# Patient Record
Sex: Male | Born: 1995 | Race: White | Hispanic: No | Marital: Single | Smoking: Never smoker
Health system: Southern US, Community
[De-identification: ages and names within clinical notes are randomized; demographics above are authoritative.]

## PROBLEM LIST (undated history)

## (undated) DIAGNOSIS — H35109 Retinopathy of prematurity, unspecified, unspecified eye: Secondary | ICD-10-CM

## (undated) DIAGNOSIS — R569 Unspecified convulsions: Secondary | ICD-10-CM

## (undated) HISTORY — DX: Retinopathy of prematurity, unspecified, unspecified eye: H35.109

## (undated) HISTORY — PX: REFRACTIVE SURGERY: SHX103

## (undated) HISTORY — PX: OTHER SURGICAL HISTORY: SHX169

---
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
## 2013-01-21 ENCOUNTER — Other Ambulatory Visit: Payer: Self-pay | Admitting: Family

## 2013-06-12 ENCOUNTER — Encounter: Payer: Self-pay | Admitting: Family

## 2013-06-12 ENCOUNTER — Ambulatory Visit (INDEPENDENT_AMBULATORY_CARE_PROVIDER_SITE_OTHER): Payer: Medicaid Other | Admitting: Family

## 2013-06-12 VITALS — BP 110/70 | HR 78 | Ht 68.25 in | Wt 120.6 lb

## 2013-06-12 DIAGNOSIS — Z79899 Other long term (current) drug therapy: Secondary | ICD-10-CM

## 2013-06-12 DIAGNOSIS — G40309 Generalized idiopathic epilepsy and epileptic syndromes, not intractable, without status epilepticus: Secondary | ICD-10-CM

## 2013-06-12 NOTE — Progress Notes (Signed)
Patient: Franklin [name] MRN: [id number] Sex: male DOB: Mar 01, 1996  Provider: Elveria Rising, NP Location of Care: [facility]  Note type: Routine return visit  History of Present Illness: Referral Source: Dr. Arnetha Courser Mccormick History from: patient and his father Chief Complaint: Seizures  Franklin [name] is a 17 y.o. male with history of prematurity and a seizure disorder. He was last seen March 24, 2012. His last seizure occurred on September 22, 2012, in the setting of sleep deprivation. On that day, he had gone to bed at 1AM and gotten up at 7AM. The seizure occurred shortly after getting up, and lasted 30 seconds. We did not make changes in his medication dose since it was known to be in the setting of sleep deprivation. Franklin [name] tends to stay up late but usually sleeps late each morning. He is not employed, and spends his days playing video games. 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 Franklin [name] has been otherwise healthy since last seen.  Review of Systems: 12 system review was remarkable for memory loss, confusion, headache, dizziness, insomnia and not enough sleep  Past Medical History  Diagnosis Date  . Seizures   . Prematurity   . Retinopathy of prematurity    Hospitalizations: no, Head Injury: no, Nervous System Infections: no, Immunizations up to date: yes Past Medical History Comments: history of prematurity, laser surgery for retinopathy of prematurity and seizure disorder. He has had some lapses in his Medicaid coverage, which has created some difficulties with adherence to treatment plan.  Surgical History Past Surgical History  Procedure Laterality Date  . Central line placement      As premature infant  . Refractive surgery      For retinopathy of prematurity    Family History family history includes Autism in his brother; Diabetes type II in his father; Hypercholesterolemia in his father; Hypertension in his father; Stroke  in his paternal aunt and paternal grandmother. Family History is negative migraines, seizures, cognitive impairment, blindness, deafness, birth defects, chromosomal disorder.  Social History History   Social History  . Marital Status: Single    Spouse Name: N/A    Number of Children: N/A  . Years of Education: N/A   Social History Main Topics  . Smoking status: Never Smoker   . Smokeless tobacco: Never Used  . Alcohol Use: No  . Drug Use: No  . Sexual Activity: None   Other Topics Concern  . None   Social History Narrative   Parents are divorced. Franklin [name] lives with his father.   Educational level 66YQ grade certificate Occupation: unemployed Living with father  Hobbies/Interest: Adult nurse games School comments Franklin [name] graduated from [employer] June 2013.  Current Outpatient Prescriptions on File Prior to Visit  Medication Sig Dispense Refill  . aspirin 325 MG tablet Take 650 mg by mouth as needed. Headache      . lamoTRIgine (LAMICTAL) 100 MG tablet TAKE 2 1/2 TABLETS BY MOUTH EVERY MORNING AND 3 TABLETS EVERY EVENING  495 tablet  3  . traMADol (ULTRAM) 50 MG tablet Take 50 mg by mouth every 6 (six) hours as needed.       No current facility-administered medications on file prior to visit.   The medication list was reviewed and reconciled. All changes or newly prescribed medications were explained.  A complete medication list was provided to the patient/caregiver.  Allergies  Allergen Reactions  . Concerta [Methylphenidate]     Physical  Exam BP 110/70  Pulse 78  Ht 5' 8.25" (1.734 m)  Wt 120 lb 9.6 oz (54.704 kg)  BMI 18.19 kg/m2 General: well developed, well nourished young man, seated on exam table, in no evident distress; right handed Head: head normocephalic and atraumatic.   Ears, Nose and Throat: Oropharynx benign. Neck: supple with no carotid or supraclavicular bruits. Respiratory: lungs clear to auscultation Cardiovascular: regular rate and  rhythm, no murmurs Musculoskeletal: no obvious deformities or lesions Skin: Mild facial acne, small reddish lesion on right parietal area of scalp  Neurologic Exam  Mental Status: Awake and fully alert.  Oriented to place and time.  Recent and remote memory intact.  Attention span, concentration, and fund of knowledge appropriate.  Mood and affect appropriate. Cranial Nerves: Fundoscopic exam reveals sharp disc margins.  Pupils equal, briskly reactive to light.  Extraocular movements full without nystagmus.  Visual fields full to confrontation.  Hearing intact and symmetric to finger rub.  Facial sensation intact.  Face, tongue, palate move normally and symmetrically.  Neck flexion and extension normal. He has mild dysarthria.  Motor: Normal bulk and tone.  Normal strength in all tested extremity muscles. Sensory: Intact to touch and temperature in all extremities. Coordination: Rapid alternating movements normal in all extremities.  Finger-to-nose and heel-to-shin performed accurately bilaterally. Romberg negative. Gait and Station: Arises from chair without difficulty.  Stance is normal.  Gait demonstrates normal stride length and balance.  Able to heel, toe, and tandem walk without difficulty. Reflexes: 1+ and symmetric.  Toes downgoing.  Assessment and Plan Franklin [name] is a 17 year old young man with history of prematurity and seizure disorder. He is taking and tolerating Lamotrigine. His last seizure occurred in January 2014 in the setting of sleep deprivation. I talked with Franklin [name] and his father about the need for him to have better sleep hygiene. He will continue his medication without change and will return for follow up in 1 year or sooner if needed.

## 2013-06-14 ENCOUNTER — Encounter: Payer: Self-pay | Admitting: Family

## 2013-06-14 DIAGNOSIS — G40309 Generalized idiopathic epilepsy and epileptic syndromes, not intractable, without status epilepticus: Secondary | ICD-10-CM | POA: Insufficient documentation

## 2013-06-14 DIAGNOSIS — Z79899 Other long term (current) drug therapy: Secondary | ICD-10-CM | POA: Insufficient documentation

## 2013-06-14 NOTE — Patient Instructions (Signed)
Continue taking Lamotrigine 100mg  1+1/2 tablets in the morning and 2 tablets at night. Let me know if you have any seizures.  Work on improving your sleep habits, and try to get at least 8 hours of sleep each night. Please plan to return for follow up in 1 year or sooner if needed.

## 2013-06-16 ENCOUNTER — Telehealth: Payer: Self-pay | Admitting: Family

## 2013-06-16 DIAGNOSIS — G40309 Generalized idiopathic epilepsy and epileptic syndromes, not intractable, without status epilepticus: Secondary | ICD-10-CM

## 2013-06-16 DIAGNOSIS — Z79899 Other long term (current) drug therapy: Secondary | ICD-10-CM

## 2013-06-16 NOTE — Telephone Encounter (Signed)
Dad, Franklin [name], left a message that Zymarion had a seizure at 7:40AM. TG

## 2013-06-16 NOTE — Telephone Encounter (Signed)
I called Dad back and he said that Franklin [name] went to bed around 2AM, then got up around 6-6:30AM to do some work with a friend. Dad heard him fall and found him in seizure. Dad estimates it lasted 3 minutes. I talked with him about sleep deprivation and seizures, and Dad agreed. I also recommended checking Lamotrigine level because that has not been done in some time because of problems with insurance. Dad agreed with this. I will mail a blood test order to him. TG

## 2013-06-17 NOTE — Telephone Encounter (Signed)
I reviewed your note and agree with the plan. 

## 2013-06-28 LAB — LAMOTRIGINE LEVEL: Lamotrigine Lvl: 12.4 ug/mL (ref 3.0–14.0)

## 2013-06-28 MED ORDER — LAMOTRIGINE 100 MG PO TABS: ORAL_TABLET | ORAL | Status: DC

## 2013-06-28 NOTE — Telephone Encounter (Signed)
I talked to father and informed him to increase the dose of medication to 3 twice a day, he understood and agreed.

## 2013-06-28 NOTE — Addendum Note (Signed)
Addended by: Deetta Perla on: 06/28/2013 05:37 PM   Modules accepted: Orders

## 2013-11-21 ENCOUNTER — Telehealth: Payer: Self-pay

## 2013-11-21 NOTE — Telephone Encounter (Signed)
Lorella NimrodHarvey, dad, lvm stating that pt had a sz this morning. Lasted 1 min, dad said it was the shortest sz he has seen thus far. I called dad and he said that pt was up late with his cousin playing video games last night. Got up this morning and started playing again. Dad did not witness sz, cousin told dad that it lasted about a min. After sz, pt was sleepy, currently sleeping. Dad said that pt has not been ill or missed any meds. Taking lamotrigine 100 mg 3 po BID. Dad believes sz was due to sleep deprivation and too much screen time. If there are any questions dad can be reached at [phone]. He did not request call back, was just calling as an [place].

## 2013-11-21 NOTE — Telephone Encounter (Signed)
I called and talked to Dad. He said that [name] stayed up very late, was sleepy when he got up and immediately started playing video games. I told Dad that I agreed that sleep deprivation was trigger for seizure. I told him that I would not recommend changes in his medication at this time and Dad agreed. TG

## 2013-11-21 NOTE — Telephone Encounter (Signed)
I reviewed both messages and agree with this plan.

## 2014-03-22 ENCOUNTER — Encounter (HOSPITAL_COMMUNITY): Payer: Self-pay | Admitting: Emergency Medicine

## 2014-03-22 ENCOUNTER — Emergency Department (HOSPITAL_COMMUNITY)
Admission: EM | Admit: 2014-03-22 | Discharge: 2014-03-22 | Disposition: A | Discharge status: Home / Self Care | Payer: Medicaid Other | Attending: Emergency Medicine | Admitting: Emergency Medicine

## 2014-03-22 ENCOUNTER — Emergency Department (HOSPITAL_COMMUNITY): Payer: Medicaid Other

## 2014-03-22 DIAGNOSIS — X500XXA Overexertion from strenuous movement or load, initial encounter: Secondary | ICD-10-CM | POA: Diagnosis not present

## 2014-03-22 DIAGNOSIS — Y9289 Other specified places as the place of occurrence of the external cause: Secondary | ICD-10-CM | POA: Diagnosis not present

## 2014-03-22 DIAGNOSIS — S93409A Sprain of unspecified ligament of unspecified ankle, initial encounter: Secondary | ICD-10-CM | POA: Diagnosis not present

## 2014-03-22 DIAGNOSIS — S93401A Sprain of unspecified ligament of right ankle, initial encounter: Secondary | ICD-10-CM

## 2014-03-22 DIAGNOSIS — Y9301 Activity, walking, marching and hiking: Secondary | ICD-10-CM | POA: Diagnosis not present

## 2014-03-22 DIAGNOSIS — G40909 Epilepsy, unspecified, not intractable, without status epilepticus: Secondary | ICD-10-CM | POA: Insufficient documentation

## 2014-03-22 DIAGNOSIS — S8990XA Unspecified injury of unspecified lower leg, initial encounter: Secondary | ICD-10-CM | POA: Diagnosis present

## 2014-03-22 DIAGNOSIS — S9000XA Contusion of unspecified ankle, initial encounter: Secondary | ICD-10-CM | POA: Insufficient documentation

## 2014-03-22 DIAGNOSIS — S99919A Unspecified injury of unspecified ankle, initial encounter: Secondary | ICD-10-CM | POA: Diagnosis present

## 2014-03-22 MED ORDER — NAPROXEN 500 MG PO TABS: 500.0000 mg | ORAL_TABLET | Freq: Two times a day (BID) | ORAL | Status: DC

## 2014-03-22 NOTE — Discharge Instructions (Signed)
Ankle Sprain °An ankle sprain is an injury to the strong, fibrous tissues (ligaments) that hold the bones of your ankle joint together.  °CAUSES °An ankle sprain is usually caused by a fall or by twisting your ankle. Ankle sprains most commonly occur when you step on the outer edge of your foot, and your ankle turns inward. People who participate in sports are more prone to these types of injuries.  °SYMPTOMS  °· Pain in your ankle. The pain may be present at rest or only when you are trying to stand or walk. °· Swelling. °· Bruising. Bruising may develop immediately or within 1 to 2 days after your injury. °· Difficulty standing or walking, particularly when turning corners or changing directions. °DIAGNOSIS  °Your caregiver will ask you details about your injury and perform a physical exam of your ankle to determine if you have an ankle sprain. During the physical exam, your caregiver will press on and apply pressure to specific areas of your foot and ankle. Your caregiver will try to move your ankle in certain ways. An X-ray exam may be done to be sure a bone was not broken or a ligament did not separate from one of the bones in your ankle (avulsion fracture).  °TREATMENT  °Certain types of braces can help stabilize your ankle. Your caregiver can make a recommendation for this. Your caregiver may recommend the use of medicine for pain. If your sprain is severe, your caregiver may refer you to a surgeon who helps to restore function to parts of your skeletal system (orthopedist) or a physical therapist. °HOME CARE INSTRUCTIONS  °· Apply ice to your injury for 1-2 days or as directed by your caregiver. Applying ice helps to reduce inflammation and pain. °¨ Put ice in a plastic bag. °¨ Place a towel between your skin and the bag. °¨ Leave the ice on for 15-20 minutes at a time, every 2 hours while you are awake. °· Only take over-the-counter or prescription medicines for pain, discomfort, or fever as directed by  your caregiver. °· Elevate your injured ankle above the level of your heart as much as possible for 2-3 days. °· If your caregiver recommends crutches, use them as instructed. Gradually put weight on the affected ankle. Continue to use crutches or a cane until you can walk without feeling pain in your ankle. °· If you have a plaster splint, wear the splint as directed by your caregiver. Do not rest it on anything harder than a pillow for the first 24 hours. Do not put weight on it. Do not get it wet. You may take it off to take a shower or bath. °· You may have been given an elastic bandage to wear around your ankle to provide support. If the elastic bandage is too tight (you have numbness or tingling in your foot or your foot becomes cold and blue), adjust the bandage to make it comfortable. °· If you have an air splint, you may blow more air into it or let air out to make it more comfortable. You may take your splint off at night and before taking a shower or bath. Wiggle your toes in the splint several times per day to decrease swelling. °SEEK MEDICAL CARE IF:  °· You have rapidly increasing bruising or swelling. °· Your toes feel extremely cold or you lose feeling in your foot. °· Your pain is not relieved with medicine. °SEEK IMMEDIATE MEDICAL CARE IF: °· Your toes are numb or blue. °·   You have severe pain that is increasing. °MAKE SURE YOU:  °· Understand these instructions. °· Will watch your condition. °· Will get help right away if you are not doing well or get worse. °Document Released: 08/24/2005 Document Revised: 05/18/2012 Document Reviewed: 09/05/2011 °ExitCare® Patient Information ©2015 [employer]. This information is not intended to replace advice given to you by your health care provider. Make sure you discuss any questions you have with your health care provider. ° ° °Emergency Department Resource Guide °1) Find a Doctor and Pay Out of Pocket °Although you won't have to find out who is covered  by your insurance plan, it is a good idea to ask around and get recommendations. You will then need to call the office and see if the doctor you have chosen will accept you as a new patient and what types of options they offer for patients who are self-pay. Some doctors offer discounts or will set up payment plans for their patients who do not have insurance, but you will need to ask so you aren't surprised when you get to your appointment. ° °2) Contact Your Local Health Department °Not all health departments have doctors that can see patients for sick visits, but many do, so it is worth a call to see if yours does. If you don't know where your local health department is, you can check in your phone book. The CDC also has a tool to help you locate your state's health department, and many state websites also have listings of all of their local health departments. ° °3) Find a Walk-in Clinic °If your illness is not likely to be very severe or complicated, you may want to try a walk in clinic. These are popping up all over the country in pharmacies, drugstores, and shopping centers. They're usually staffed by nurse practitioners or physician assistants that have been trained to treat common illnesses and complaints. They're usually fairly quick and inexpensive. However, if you have serious medical issues or chronic medical problems, these are probably not your best option. ° °No Primary Care Doctor: °- Call [facility] at  832-8000 - they can help you locate a primary care doctor that  accepts your insurance, provides certain services, etc. °- Physician Referral Service- 1-800-533-3463 ° °Chronic Pain Problems: °Organization         Address  Phone   Notes  °[facility]  (336) 297-2271 Patients need to be referred by their primary care doctor.  ° °Medication Assistance: °Organization         Address  Phone   Notes  °[place] [facility] [address] °[place], [place] [address] (336) 641-8030 --Must be a resident of [place] °-- Must have NO insurance coverage whatsoever (no Medicaid/ Medicare, etc.) °-- The pt. MUST have a primary care doctor that directs their care regularly and follows them in the community °  °[facility]  (866) 331-1348   °[employer]  (888) 892-1162   ° °Agencies that provide inexpensive medical care: °Organization         Address  Phone   Notes  °[facility]  (336) 832-8035   °[facility]    (336) 832-7272   °[facility] [address] °[place], [place] [address] (336) 832-4777   °[facility] [address], ° (336) 271-4999   °[facility]    (336) 373-0678   °[facility]    (  336) 272-1050   °[facility] ° [address], [place] Phone:  (336) 832-4444, Fax:  (336) 832-4440 Hours of Operation:  9 am - 6 pm, M-F.  Also accepts Medicaid/Medicare and self-pay.  °[facility] ° [address], [place] Phone: (336) 832-3150, Fax: (336) 832-3151. Hours of Operation:  8:30 am - 5:30 pm, M-F.  Also accepts Medicaid and self-pay.  °[facility] [place] [address], [place] Phone: (336) 878-6027   °[facility] [address], [place], [place] (336)723-1848, Ext. 123 Mondays & Thursdays: 7-9 AM.  First 15 patients are seen on a first come, first serve basis. °  ° °Medicaid-accepting [place] Providers: ° °Organization         Address  Phone   Notes  °[facility] [address], [place] (336) 641-2100 Also accepts self-pay patients.  °[facility] [address], [place] ° (336) 856-9996   °[facility] [address], [place] (336) 288-8857   °[facility] [address] [place] [address], [place] (336) 299-7000   °Veita Bland [address], [place]  ° (336) 373-1557 Only accepts [place] Access Medicaid patients after they have their name applied to their card.  ° °Self-Pay (no insurance) in [place]: ° °Organization         Address  Phone   Notes  °Sickle Cell Patients, [facility] [address], [place] (336) 832-1970   °[facility] [address], [place] (336) 832-4400   °[facility] ° [address], [place] (336) 992-4800   °[facility]/Dr. Osei-Bonsu ° [address] [place] [address], [place] or [address], [place] (336) 841-8500 Phone number for both [place] and [place] locations is the same.  °[facility] [address], [place] (336) 299-0000   °[facility] [address] [place] [address], [place] or [address] (336) 852-7530 °(336) 878-2260   °[facility] [address], [place] (336) 350-1642, phone; (336) 294-5005, fax Sees patients 1st and 3rd Saturday of every month.  Must not qualify for public or private insurance (i.e. Medicaid, Medicare, [place] Health Choice, Veterans' Benefits) • Household income should be no more than 200% of the poverty level •The clinic cannot treat you if you are pregnant or think you are pregnant • Sexually transmitted diseases are not treated at the clinic.  ° ° °Dental Care: °Organization         Address  Phone  Notes  °[facility] [address], [place] (336) 641-6152 Accepts children up to age 21 who are enrolled in Medicaid or [place] Health Choice; pregnant women with a Medicaid card; and children who have applied for Medicaid or [place] Health Choice, but were declined, whose parents can pay a reduced fee at time of service.  °[facility] [place]  [address], [place] (336) 641-7733 Accepts children up to age 21 who are enrolled  in Medicaid or [place] Health Choice; pregnant women with a Medicaid card; and children who have applied for Medicaid or [place] Health Choice, but were declined, whose parents can pay a reduced fee at time of service.  °[facility] ° [address], [place] (336) 641-4533 Patients are seen by appointment only. Walk-ins are not accepted. [facility] will see patients 18 years of age and older. °Monday - Tuesday (8am-5pm) °Most Wednesdays (8:30-5pm) °$30 per visit, cash only  °[facility] ° [address], [place] (336) 641-4533 Patients are seen by appointment only. Walk-ins are not accepted. [facility] will see patients 18 years of age and older. °One Wednesday Evening (Monthly: Volunteer Based).  $30 per visit, cash only  °[facility]  (919) 537-3737 for adults; Children under age 4, call Graduate Pediatric Dentistry at (919) [phone]. Children aged 4-14, please call (919) 537-3737 to request a pediatric application. ° Dental services are provided in all areas of dental care including fillings, crowns and bridges, complete and partial dentures, implants, gum treatment, root canals, and extractions. Preventive care is also provided. Treatment is provided to both adults and children. °Patients are selected via a lottery and there is often a waiting list. °  °[facility] [address], °[place] ° (336) 763-8833 [email or web address] °  °[facility] [address], [place], [place] (336)723-1848, Ext. 123 Second and Fourth Thursday of each month, opens at 6:30 AM; Clinic ends at 9 AM.  Patients are seen on a first-come first-served basis, and a limited number are seen during each clinic.  ° °[facility] ° [address], [place], [place] (336) 723-7904   Eligibility Requirements °You must have lived in [place], [place], or [place] counties for at least the last three months. °  You cannot be  eligible for state or federal sponsored healthcare insurance, including Veterans Administration, Medicaid, or Medicare. °  You generally cannot be eligible for healthcare insurance through your employer.  °  How to apply: °Eligibility screenings are held every Tuesday and Wednesday afternoon from 1:00 pm until 4:00 pm. You do not need an appointment for the interview!  °[facility] [address], [place], [place] 336-631-2330   °[facility]  336-342-8273   °[facility]  336-703-3100   °[facility]  336-570-6415   ° °Behavioral Health Resources in the Community: °Intensive Outpatient Programs °Organization         Address  Phone  Notes  °[facility] [address], [place], [place] 336-878-6098   °[facility] [address], [place], [place] 336-832-9800   °[facility] [address], [place], [place] ° 336-882-2125   °[facility] [address],  °[place], [place] 1-800-853-5163 or 336-641-4981   °Substance Abuse Resources °Organization         Address  Phone  Notes  °[facility]  336-882-2125   °[facility]  336-784-9470   °[facility]  336-285-9073   °[facility]  336-845-3988   °Residential & Outpatient Substance Abuse Program  1-800-659-3381   °Psychological Services °Organization         Address  Phone  Notes  °[facility]  336- 832-9600   °[facility]  336- 378-7881   °[facility] [address], [place] 1-800-853-5163 or 336-641-4981   ° °Mobile Crisis Teams °Organization         Address  Phone  Notes  °[facility], Mobile Crisis Care Unit  1-877-626-1772   °Assertive °[facility] ° [address] ,    336-834-9664   °Sharon DeEsch [address] °[place] 336-554-5454   ° °Self-Help/Support Groups °Organization          Address  Phone             Notes  °[facility] - variety of support groups  336- 373-1402 Call for more information  °Narcotics Anonymous (NA), [facility] [address], °[place]  2 meetings at this location  ° °Residential Treatment Programs °Organization         Address  Phone  Notes  °[facility] [address],    °[place]  1-866-801-8205   °[facility] ° [address], [place], [place] 704-293-8524   °[facility] [address], [place] 336-845-3988 Admissions: 8am-3pm M-F  °[facility] [address]    °[place], [place] 336-841-1104   °[facility] [address], [place], [place] 336-379-7146   °[facility] [address]  °[place], [place] 336-285-9073   °[facility] Intensive Outpatient [address], [place], [place] 336-852-3033   °[facility] [address]  °[place], [place] 1-877-615-2722 or 336-784-9470   °[facility] [address] [place], [place] 336-227-7417 Accepts Medicaid  °[facility] [address]  °[place] 1-800-659-3381 Substance Abuse/Addiction Treatment  ° °[place] Behavioral Health Resources °Organization         Address  Phone  Notes  °[facility]  (888) 581-9988   °Julie Brannon, PhD [address] [place], [place]   (336) 349-5553 or (336) 951-0000   °[facility]   [address] °[place], [place] (336) 349-4454   °[facility] [address], [place], [place] (336) 342-8316 Insurance/Medicaid/sponsorship through [facility]  °[facility] [address]                                    [place], [place] (336) 342-8316 Therapy/tele-psych/case  °[facility] [address]  ° [place], [place] (336) 349-2233    °Dr. Arfeen  (336) 349-4544   °[facility] [place]  [employer]  [facility] 1) [address], [place] °2) [address], [place] °3)  [address], [place] (336) 349-3220 °(336) 342-7768 ° °(336) 342-8140   °[place] [facility] (336) 342-1394 or (336) 342-3537 (After Hours)    ° ° ° °
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
## 2014-03-23 NOTE — ED Provider Notes (Signed)
Medical screening examination/treatment/procedure(s) were performed by non-physician practitioner and as supervising physician I was immediately available for consultation/collaboration.   EKG Interpretation None       Stevin Bielinski, MD 03/23/14 1451 

## 2014-04-09 ENCOUNTER — Telehealth: Payer: Self-pay | Admitting: Family

## 2014-04-09 NOTE — Telephone Encounter (Signed)
[facility] [place] [name] had a seizure on Saturday 04/07/14. I called Dad to talk with him about it. He said that [place] [name] had stayed up late the night before, then [place] [employer] [place] the [place] playing video games,then walked 1.5 miles home. He had a seizure that evening that lasted 2-3 minutes. He has not missed medication. Dad says that he has a seizure every 6 months or so when he is sleep deprived. He last seizure occurred in March. I asked Dad to remind [place] [name] that he should be getting more sleep. I scheduled follow up appointment for him in October, as previously planned but asked Dad to call me if [place] [name] has more seizures in the interim. TG

## 2014-04-09 NOTE — Telephone Encounter (Signed)
I reviewed your note and agree with this plan. 

## 2014-04-25 ENCOUNTER — Telehealth: Payer: Self-pay | Admitting: Family

## 2014-04-25 DIAGNOSIS — G40309 Generalized idiopathic epilepsy and epileptic syndromes, not intractable, without status epilepticus: Secondary | ICD-10-CM

## 2014-04-25 DIAGNOSIS — Z79899 Other long term (current) drug therapy: Secondary | ICD-10-CM

## 2014-04-25 NOTE — Telephone Encounter (Signed)
[name] called to report that [place] [name] had a seizure today that lasted about 1-1/2 minutes. He had not been up late last night as he had been when Dad called to report a seizure on 04/09/14. He is compliant with medication - Dad puts his medication into weekly pill container and checks it. His is taking Lamotrigine 100mg  3 po BID. His last Lamotrigine level was 12.4 mcg/ml on 06/27/13. Dad wonders if sitting to close to the TV could trigger a seizure. [place] [name] has a revisit appt on 06/07/14. I told Dad that I would consult with Dr Sharene SkeansHickling and call back. TG

## 2014-04-26 NOTE — Telephone Encounter (Signed)
I spoke father.  We're going to obtain a lamotrigine level and a CBC with differential.  We may need to increase his dose.  I don't believe that his proximity to the TV or in the longevity with which she watches TV or plays video games had anything to do with his seizures.  His last seizure before this was about 6 weeks ago.  [facility] On Monday, August 24.  Please make certain that the orders are sent there in a timely fashion.Thank you

## 2014-04-30 ENCOUNTER — Other Ambulatory Visit: Payer: Self-pay | Admitting: Pediatrics

## 2014-04-30 LAB — CBC WITH DIFFERENTIAL/PLATELET
BASOS ABS: 0 10*3/uL (ref 0.0–0.1)
BASOS PCT: 0 % (ref 0–1)
Eosinophils Absolute: 0.1 10*3/uL (ref 0.0–0.7)
Eosinophils Relative: 2 % (ref 0–5)
HCT: 43.9 % (ref 39.0–52.0)
Hemoglobin: 15.1 g/dL (ref 13.0–17.0)
Lymphocytes Relative: 34 % (ref 12–46)
Lymphs Abs: 1.8 10*3/uL (ref 0.7–4.0)
MCH: 31 pg (ref 26.0–34.0)
MCHC: 34.4 g/dL (ref 30.0–36.0)
MCV: 90.1 fL (ref 78.0–100.0)
Monocytes Absolute: 0.4 10*3/uL (ref 0.1–1.0)
Monocytes Relative: 7 % (ref 3–12)
NEUTROS ABS: 3 10*3/uL (ref 1.7–7.7)
Neutrophils Relative %: 57 % (ref 43–77)
Platelets: 259 10*3/uL (ref 150–400)
RBC: 4.87 MIL/uL (ref 4.22–5.81)
RDW: 13.5 % (ref 11.5–15.5)
WBC: 5.3 10*3/uL (ref 4.0–10.5)

## 2014-05-02 LAB — LAMOTRIGINE LEVEL: LAMOTRIGINE LVL: 10.2 ug/mL (ref 4.0–18.0)

## 2014-05-02 NOTE — Telephone Encounter (Signed)
Lamotrigine level was 10.2 mcg/mL.  I would recommend increasing him to 3 tablets in the morning and 4 at nighttime unless we want to consider lamotrigine XR.  We can discuss this today.

## 2014-05-02 NOTE — Telephone Encounter (Signed)
Discussed with Dr Sharene Skeans. We will try Selestino on Lamotrigine ER , 3 tablets per day. I called Dad and left a message asking him to call me tomorrow. TG

## 2014-05-03 ENCOUNTER — Encounter: Payer: Self-pay | Admitting: Family

## 2014-05-03 NOTE — Telephone Encounter (Signed)
I called and left a message. I will mail a letter asking him to call. TG

## 2014-05-07 MED ORDER — LAMOTRIGINE ER 250 MG PO TB24: ORAL_TABLET | ORAL | Status: DC

## 2014-05-07 NOTE — Addendum Note (Signed)
Addended by: Deetta Perla on: 05/07/2014 06:50 PM   Modules accepted: Orders

## 2014-05-07 NOTE — Telephone Encounter (Signed)
I spoke with father for 5 minutes.  I recommended changing to lamotrigine 250 mg ER.  We need to use up his 100 mg tablets.  He will increase to 3-1/2 tablets twice daily.  At the end at that time he will switch to 200 mg ER 1 in the morning and 2 at nighttime.  He has not been seen since October 2014.  Asked father to call for an appointment either with Inetta Fermo or me.

## 2014-05-07 NOTE — Telephone Encounter (Signed)
The father would like to know the pt's lab results. He can be reached at [phone].

## 2014-05-08 NOTE — Telephone Encounter (Signed)
Patient already has an appointment with Sula Soda. On Oct. 1. MB

## 2014-05-28 ENCOUNTER — Telehealth: Payer: Self-pay | Admitting: Family

## 2014-05-28 DIAGNOSIS — G40309 Generalized idiopathic epilepsy and epileptic syndromes, not intractable, without status epilepticus: Secondary | ICD-10-CM

## 2014-05-28 NOTE — Telephone Encounter (Signed)
Franklin [name] called to follow up on 10 min seizure that [facility] had yesterday. He said that he was outside in the yard playing with neighbor's child when he suddenly began going in circles and Dad could tell that his behavior was different. He went to him and realized that Franklin [name] was in seizure. He helped him to ground and then Franklin [name] had convulsive seizure for about 5 minutes. Dad said that the walking and nonresponsive portion before the convulsions lasted about 5 minutes as well. He said that Franklin [name] had changed to Lamotrogine XR  1 AM and 2PM about 2 weeks ago (as instructed by Dr Sharene Skeans on Aug 31st phone). He has tolerated it well. The trough drug level prior to that on immediate release Lamotrigine  BID was 10.2 mcg/ml on Aug 24th, done because Franklin [name] had a seizure on Aug 19th. I told Dad that I would consult with Dr Sharene Skeans to see if he wanted to make any changes before his appointment with me on Oct 1st. I would also be happy to see him sooner to discuss medication changes, but will talk with Dr Sharene Skeans before calling Dad back.

## 2014-05-28 NOTE — Telephone Encounter (Signed)
I reached grandmother.  Father was not present.  I recommended that we order a trough lamotrigine level.  I have ordered it and would like you to release it.

## 2014-05-29 NOTE — Telephone Encounter (Signed)
I called this morning and spoke with grandmother. She said that Dad was out and to call later this evening. I called just now and spoke with Dad. He asked for lab order to be mailed to him because Rodrigus just started new dose of Lamotrigine today. He will take him for lab draw next week. I also rescheduled his appointment for October 1st because Dr Sharene Skeans is out of the office that day, and I will need to consult with him about Masud's seizures. Dad accepted an appointment on October 12th @ 145pm, arrival time 130pm. TG

## 2014-05-29 NOTE — Telephone Encounter (Signed)
The father of the pt called. He can be reached at [phone].

## 2014-06-07 ENCOUNTER — Ambulatory Visit: Payer: Medicaid Other | Admitting: Family

## 2014-06-14 LAB — LAMOTRIGINE LEVEL: Lamotrigine Lvl: 6 ug/mL (ref 4.0–18.0)

## 2014-06-18 ENCOUNTER — Encounter: Payer: Self-pay | Admitting: Family

## 2014-06-18 ENCOUNTER — Ambulatory Visit (INDEPENDENT_AMBULATORY_CARE_PROVIDER_SITE_OTHER): Payer: Medicaid Other | Admitting: Family

## 2014-06-18 VITALS — BP 106/74 | HR 80 | Ht 68.75 in | Wt 126.8 lb

## 2014-06-18 DIAGNOSIS — Z79899 Other long term (current) drug therapy: Secondary | ICD-10-CM

## 2014-06-18 DIAGNOSIS — G40309 Generalized idiopathic epilepsy and epileptic syndromes, not intractable, without status epilepticus: Secondary | ICD-10-CM

## 2014-06-18 NOTE — Progress Notes (Signed)
Patient: Franklin [name] MRN: [id number] Sex: male DOB: 07/29/1996  Provider: Elveria RisingGOODPASTURE, Din Bookwalter, NP [facility]  Note type: Routine return visit  History of Present Illness: Referral Source: Dr. Dereck LigasHilary B. McCormick History from: patient and his father Chief Complaint: Seizures  Franklin [name] is a 18 y.o. young man with history of prematurity and a seizure disorder. He was last seen June 14, 2013. Franklin [name] has been taking Lamotrigine since 2008. In the past, he has had seizures when he has been sleep deprived. Kazuma typically needs at least 9 hours of sleep, and on days when he gets 6 or fewer hours of sleep, he tends to have breakthrough seizures. He had a seizure shortly after being seen last October  And a seizure in March, both in the setting of sleep deprivation. However in August he had 2 seizures and September he had 1 one seizure. He was not sleep deprived on any of these occasions and he had been compliant with medication. Timofey's father monitors his medication and there are no problems with compliance. A Lamotrigine level was obtained in August and found to be 10.2 mcg/ml. Dr Sharene SkeansHickling spoke with Faron's father and the decision was made to change the Lamotrigine to Lamotrigine XR and increase the dose from 300mg  twice per day to 250mg  in the morning and 500mg  at night. A level was obtained a week after this change and the level was 6 mcg/ml. However he has been seizure free despite the decrease in blood level since May 27, 2014.   Franklin [name] is not employed, and spends his days playing video games. [place] [employer] [place] 1 1.5 miles away. Franklin [name] has some difficulty with sleep because his bedroom is a direct pathway in and out of the home, and his father says that his cousin, who lives in the home, goes in and out during the night. Franklin [name] has been otherwise healthy since last seen.  Review of Systems: 12  system review was remarkable for seizures  Past Medical History  Diagnosis Date  . Seizures   . Prematurity   . Retinopathy of prematurity    Hospitalizations: No., Head Injury: No., Nervous System Infections: No., Immunizations up to date: Yes.   Past Medical History Comments: see Hx.  Surgical History Past Surgical History  Procedure Laterality Date  . Central line placement      As premature infant  . Refractive surgery      For retinopathy of prematurity    Family History family history includes Autism in his brother; Diabetes type II in his father; Hypercholesterolemia in his father; Hypertension in his father; Stroke in his paternal aunt and paternal grandmother. Family History is otherwise negative for migraines, seizures, cognitive impairment, blindness, deafness, birth defects, chromosomal disorder, autism.  Social History History   Social History  . Marital Status: Single    Spouse Name: N/A    Number of Children: N/A  . Years of Education: N/A   Social History Main Topics  . Smoking status: Never Smoker   . Smokeless tobacco: Never Used  . Alcohol Use: No  . Drug Use: No  . Sexual Activity: No   Other Topics Concern  . None   Social History Narrative   Parents are divorced. Franklin [name] lives with his father.   Educational level: 12th grade School Attending:N/A Living with:  father  Hobbies/Interest: video games, movies, drawing, reading, computers and walking. School comments:  Franklin [name]  graduated from [employer] in 2013. He is currently living at home.  Physical Exam BP 106/74  Pulse 80  Ht 5' 8.75" (1.746 m)  Wt 126 lb 12.8 oz (57.516 kg)  BMI 18.87 kg/m2 General: well developed, well nourished young man, seated on exam table, in no evident distress; right handed  Head: head normocephalic and atraumatic.  Ears, Nose and Throat: Oropharynx benign.  Neck: supple with no carotid or supraclavicular bruits.  Respiratory: lungs clear to  auscultation  Cardiovascular: regular rate and rhythm, no murmurs  Musculoskeletal: no obvious deformities or lesions  Skin: Mild facial acne, small reddish lesion on right parietal area of scalp   Neurologic Exam  Mental Status: Awake and fully alert. Oriented to place and time. Recent and remote memory intact. Attention span and concentration appropriate. Fund of knowledge less than expected for age. Mood and affect appropriate.  Cranial Nerves: Fundoscopic exam reveals sharp disc margins. Pupils equal, briskly reactive to light. Extraocular movements full without nystagmus. Visual fields full to confrontation. Hearing intact and symmetric to finger rub. Facial sensation intact. Face, tongue, palate move normally and symmetrically. Neck flexion and extension normal. He has mild dysarthria.  Motor: Normal bulk and tone. Normal strength in all tested extremity muscles.  Sensory: Intact to touch and temperature in all extremities.  Coordination: Rapid alternating movements normal in all extremities. Finger-to-nose and heel-to-shin performed accurately bilaterally. Romberg negative.  Gait and Station: Arises from chair without difficulty. Stance is normal. Gait demonstrates normal stride length and balance. Able to heel, toe, and tandem walk without difficulty.  Reflexes: 1+ and symmetric. Toes downgoing.   Assessment and Plan Franklin [name] is a 18 year old young man with history of prematurity and seizure disorder. He has been taking and tolerating Lamotrigine XR 250mg  1 in the morning and 2 at night since September, as he was having seizures on immediate release Lamotrigine. Franklin [name] has had seizures in the past when he has been sleep deprived. I talked with Franklin [name] and his father about the need for him to have better sleep hygiene, and we talked about ways to accomplish that. Dr Sharene SkeansHickling was consulted and came in to talk with them as well. He repeated the need for Myan to get sufficient sleep, and told his  father that if Franklin [name] continued to have seizures, that we may consider one more increase in Lamotrigine XR to 2 tablets twice per day, or we may consider adding on a second medication. We talked about using Melatonin for sleep, but Keelan's sleep problems are not initiating sleep as much as sleep being disrupted by external sources, and we would prefer to wait before adding Melatonin. For now, he will continue his medication without change and will return for follow up in 6 months or sooner if needed. Dad will continue to notify this office when Franklin [name] has seizures and we will make changes in his treatment plan accordingly.

## 2014-06-19 ENCOUNTER — Encounter: Payer: Self-pay | Admitting: Family

## 2014-06-19 NOTE — Patient Instructions (Signed)
Continue to notify this office when [name] has seizures. We may increase his dose of Lamotrigine XR or we may add a second medication to his treatment.  For now, [name] should continue to take Lamotrigine XR 250 mg 1 in the morning and 2 at night.   Work on getting more sleep as we discussed today. Problems going to sleep or staying asleep are usually due to sleep habits. Perhaps you could talk with your family again about not disturbing Kullen by going through his room to go outside. Here are some tips to help to establish good sleep habits. 1. You should go to bed at the same time every day.  This routine helps to train the sleep center of your brain that it is time to go to sleep on time. 2. You should get up at the same time every day. Late weekend nights or sleeping late on weekends can throw off the sleep schedule for days. 3. You should get regular exercise, preferably in the morning or afternoon, but not right before bedtime. 4. You should get regular exposure to outdoor or bright light, especially in the late afternoon. 5. Keep the temperature in the bedroom cool and comfortable. Sometimes a fan helps with this, and also helps create "white noise" that will also help to tune out other noises that may wake you up at night. 6. Keep the bedroom quiet when sleeping. 7. Keep the bedroom dark enough to facilitate sleep. 8. If there is a clock in the bedroom, turn it to the wall. Staring at the clock does not help with going to sleep and for some people, it makes you stay awake if you awaken during the night. 9. Avoid stimulating activities before sleep such as watching television, playing video games, communicating with friends or exercising. A good rule of thumb is to stop electronics at least 30 minutes before bedtime. It is best not to have televisions, computers, hand held games, phones and so forth in the bedroom. The bedroom should be associated only with sleep. 10. Avoid caffeine such as soft  drinks, chocolate, and tea in the afternoons and evenings. Caffeine can also lead to "light sleep" and frequent awakenings during the night, even if it does not keep the person from falling asleep. 11. If you are awake and tossing and turning, get up and do a quiet, low stimulation activity such as reading a book (not an electronic book, video game or internet surfing) for about 20 minutes. Then turn lights off and try to sleep. If still awake, repeat the process until you are sleepy. If possible, the rest of the home should be quiet during this time. This keeps the bed from being associated with sleeplessness. 12. If you are never drowsy at the desired bedtime, begin a schedule of delayed bedtimes by 30 minutes, so that you experience falling asleep more quickly when you get into bed. Then advance the time earlier every few days until the desired bedtime is reached. This re-trains the sleep center. It may take some time but eventually you will get to sleep earlier.   Please plan to return for follow up in 6 months or sooner if needed.

## 2014-12-19 ENCOUNTER — Other Ambulatory Visit: Payer: Self-pay | Admitting: Pediatrics

## 2014-12-24 ENCOUNTER — Ambulatory Visit (INDEPENDENT_AMBULATORY_CARE_PROVIDER_SITE_OTHER): Payer: Medicaid Other | Admitting: Family

## 2014-12-24 ENCOUNTER — Encounter: Payer: Self-pay | Admitting: Family

## 2014-12-24 VITALS — BP 104/72 | HR 84 | Ht 69.0 in | Wt 126.4 lb

## 2014-12-24 DIAGNOSIS — G40309 Generalized idiopathic epilepsy and epileptic syndromes, not intractable, without status epilepticus: Secondary | ICD-10-CM

## 2014-12-24 MED ORDER — LAMOTRIGINE ER 250 MG PO TB24: ORAL_TABLET | ORAL | Status: DC

## 2014-12-24 NOTE — Progress Notes (Signed)
Patient: Franklin [name] MRN: [id number] Sex: male DOB: 04/21/1996  Provider: Elveria RisingGOODPASTURE, Franklin Bevan, NP [facility]  Note type: Routine return visit  History of Present Illness: Referral Source: Dr. Dereck LigasHilary B. McCormick  [facility] Chief Complaint: Seizures   Franklin [name] [name] is a 19 y.o. young man with history of generalized convulsive seizure disorder. He was last seen June 18, 2014. Franklin [name] has history of prematurity, intellectual disability and a generalized convulsive seizure disorder. Franklin HaileyDustin has been taking Lamotrigine since 2008.  He was switched to Lamotrigine XR in 2015. He tends to have seizures when he has been sleep deprived. Franklin [name] typically needs at least 9 hours of sleep, and on days when he gets 6 or fewer hours of sleep, he tends to have breakthrough seizures. Dad reports today that Franklin HaileyDustin has had no seizures since September 2015.   Franklin HaileyDustin is not employed, and spends his days playing video games and walking from his [place] to [employer] [place], which [place] approximately 1 [place].5 miles away. [place] the [employer] [name] says that he looks up things on the computer, reads and writes down his thoughts. There are no concerns with behavior. Franklin HaileyDustin has been generally healthy since last seen and Dad has no specific concerns about his health other than his seizure history.    Review of Systems: Please see the HPI for neurologic and other pertinent review of systems. Otherwise, the following systems are noncontributory including constitutional, eyes, ears, nose and throat, cardiovascular, respiratory, gastrointestinal, genitourinary, musculoskeletal, skin, endocrine, hematologic/lymph, allergic/immunologic and psychiatric.   Past Medical History  Diagnosis Date  . Seizures   . Prematurity   . Retinopathy of prematurity    Hospitalizations: No., Head Injury: No., Nervous System Infections: No., Immunizations up to date: Yes.   Past Medical History  Comments: See history  Surgical History Past Surgical History  Procedure Laterality Date  . Central line placement      As premature infant  . Refractive surgery      For retinopathy of prematurity    Family History family history includes Autism in his brother; Diabetes type II in his father; Hypercholesterolemia in his father; Hypertension in his father; Stroke in his paternal aunt and paternal grandmother. His father had cardiac bypass surgery in the fall of 2015 Family History is otherwise negative for migraines, seizures, cognitive impairment, blindness, deafness, birth defects, chromosomal disorder, autism.  Social History History   Social History  . Marital Status: Single    Spouse Name: N/A  . Number of Children: N/A  . Years of Education: N/A   Social History Main Topics  . Smoking status: Never Smoker   . Smokeless tobacco: Never Used  . Alcohol Use: No  . Drug Use: No  . Sexual Activity: No   Other Topics Concern  . None   Social History Narrative   Parents are divorced. Franklin HaileyDustin lives with his father.   Educational level: Graduated   Living with:  father  Hobbies/Interest: Franklin HaileyDustin enjoys walking to [employer], drawing, reading and playing video games. School comments:  Franklin HaileyDustin graduated from [employer] in 2013.   Allergies Allergies  Allergen Reactions  . Concerta [Methylphenidate]     Physical Exam BP 104/72 mmHg  Pulse 84  Ht 5\' 9"  (1.753 m)  Wt 126 lb 6.4 oz (57.335 kg)  BMI 18.66 kg/m2 General: well developed, well nourished young man, seated on exam table, in no evident distress; right handed  Head: head normocephalic and atraumatic.  Ears, Nose and Throat: Oropharynx benign.  Neck: supple with no carotid or supraclavicular bruits.  Respiratory: lungs clear to auscultation  Cardiovascular: regular rate and rhythm, no murmurs  Musculoskeletal: no obvious deformities or lesions  Skin: Mild facial acne, small reddish lesion  on right parietal area of scalp   Neurologic Exam  Mental Status: Awake and fully alert. Oriented to place and time. Recent and remote memory intact. Attention span and concentration appropriate. Fund of knowledge less than expected for age. Mood and affect appropriate.  Cranial Nerves: Fundoscopic exam reveals sharp disc margins. Pupils equal, briskly reactive to light. Extraocular movements full without nystagmus. Visual fields full to confrontation. Hearing intact and symmetric to finger rub. Facial sensation intact. Face, tongue, palate move normally and symmetrically. Neck flexion and extension normal. He has mild dysarthria.  Motor: Normal bulk and tone. Normal strength in all tested extremity muscles.  Sensory: Intact to touch and temperature in all extremities.  Coordination: Rapid alternating movements normal in all extremities. Finger-to-nose and heel-to-shin performed accurately bilaterally. Romberg negative.  Gait and Station: Arises from chair without difficulty. Stance is normal. Gait demonstrates normal stride length and balance. Able to heel, toe, and tandem walk without difficulty.  Reflexes: 1+ and symmetric. Toes downgoing.   Impression 1. Generalized convulsive seizure disorder, G40.309 2. Intellectual delay, F70 3. History of prematurity, Z87.898  Recommendations for plan of care The patient's previous [facility] records were reviewed. Franklin [name] has neither had nor required imaging or lab studies since the last visit. Franklin [name] is a 19 year old young man with history of generalized convulsive seizures, history of prematurity and intellectual delay. He is taking and tolerating Lamotrigine XR and has remained seizure free since September 2015. I reminded Franklin [name] and his father of the need for him to get at least 9 hours of sleep at night, and to not miss any medication doses. He will continue his medication without change for now and will return for follow up in 6 months or sooner if  needed.  Dad will continue to notify this office when Evans has seizures and we will make changes in his treatment plan accordingly. Dad agreed with these plans.  The medication list was reviewed and reconciled.  No changes were made in the prescribed medications today.  A complete medication list was provided to Camarion's father.  Total time spent with the patient was 25 minutes, of which 50% or more was spent in counseling and coordination of care.

## 2014-12-26 NOTE — Patient Instructions (Signed)
Continue giving Elridge Lamotrine XR 250mg  as you have been giving it. Let me know if he has any breakthrough seizures.  Remember that it is important for Khali to get at least 9 hours of sleep at night, and to avoid missing any medication.   I will see [name] back in follow up in 6 months or sooner if needed.

## 2015-02-21 ENCOUNTER — Emergency Department (HOSPITAL_COMMUNITY)
Admission: EM | Admit: 2015-02-21 | Discharge: 2015-02-21 | Disposition: A | Discharge status: Home / Self Care | Payer: Medicaid Other | Attending: Emergency Medicine | Admitting: Emergency Medicine

## 2015-02-21 ENCOUNTER — Encounter (HOSPITAL_COMMUNITY): Payer: Self-pay

## 2015-02-21 DIAGNOSIS — Z79899 Other long term (current) drug therapy: Secondary | ICD-10-CM | POA: Insufficient documentation

## 2015-02-21 DIAGNOSIS — Z7982 Long term (current) use of aspirin: Secondary | ICD-10-CM | POA: Insufficient documentation

## 2015-02-21 DIAGNOSIS — H6123 Impacted cerumen, bilateral: Secondary | ICD-10-CM | POA: Insufficient documentation

## 2015-02-21 DIAGNOSIS — G40909 Epilepsy, unspecified, not intractable, without status epilepticus: Secondary | ICD-10-CM | POA: Insufficient documentation

## 2015-02-21 MED ORDER — CARBAMIDE PEROXIDE 6.5 % OT SOLN: 5.0000 [drp] | Freq: Two times a day (BID) | OTIC | Status: DC

## 2015-02-21 NOTE — ED Provider Notes (Signed)
CSN: [id number]     Arrival date & time 02/21/15  1616 History  This chart was scribed for non-physician practitioner Allen Derry, PA-C working with Toy Cookey, MD by Murriel Hopper, ED Scribe. This patient was seen in room WTR5/WTR5 and the patient's care was started at 5:50 PM.    Chief Complaint  Patient presents with  . Cerumen Impaction     Patient is a 19 y.o. male presenting with foreign body in ear. The history is provided by the patient. No language interpreter was used.  Foreign Body in Ear This is a new problem. The current episode started in the past 7 days. The problem occurs constantly. The problem has been unchanged. Pertinent negatives include no abdominal pain, arthralgias, chest pain, chills, fever, headaches, myalgias, nausea, numbness, rash, sore throat, vomiting or weakness. Nothing aggravates the symptoms. Treatments tried: q-tips. The treatment provided no relief.     HPI Comments: KOVE LANIUS is a 19 y.o. male with a PMHx of seizures, prematurity, and retinopathy, who presents to the Emergency Department complaining of constant, worsening cerumen impaction in his right ear that has been present for 3 days. Pt denies pain or drainage to ear and states that there is no foreign object in there. Pt states he has used q-tips to try to get the wax out of his ear with no relief. Endorses feeling that his hearing is muffled on that side. Pt denies fevers, chills, URI symptoms, chest pain, SOB, sore throat, abd pain, n/v/d, numbness, tingling, HA, vision changes, or weakness.   Past Medical History  Diagnosis Date  . Seizures   . Prematurity   . Retinopathy of prematurity    Past Surgical History  Procedure Laterality Date  . Central line placement      As premature infant  . Refractive surgery      For retinopathy of prematurity   Family History  Problem Relation Age of Onset  . Autism Brother     Also has cognitive delays  . Diabetes type II Father    . Hypercholesterolemia Father   . Hypertension Father   . Stroke Paternal Grandmother   . Stroke Paternal Aunt    History  Substance Use Topics  . Smoking status: Never Smoker   . Smokeless tobacco: Never Used  . Alcohol Use: No    Review of Systems  Constitutional: Negative for fever and chills.  HENT: Negative for ear discharge, ear pain, rhinorrhea and sore throat.   Eyes: Negative for pain and discharge.  Respiratory: Negative for shortness of breath.   Cardiovascular: Negative for chest pain.  Gastrointestinal: Negative for nausea, vomiting and abdominal pain.  Musculoskeletal: Negative for myalgias and arthralgias.  Skin: Negative for rash.  Allergic/Immunologic: Negative for immunocompromised state.  Neurological: Negative for weakness, numbness and headaches.   10 Systems reviewed and are negative for acute change except as noted in the HPI.    Allergies  Concerta  Home Medications   Prior to Admission medications   Medication Sig Start Date End Date Taking? Authorizing Provider  aspirin 325 MG tablet Take 650 mg by mouth as needed. Headache    Historical Provider, MD  LamoTRIgine 250 MG TB24 TAKE 1 TABLET BY MOUTH IN THE MORNING AND 2 TABLETS AT NIGHT 12/24/14   Elveria Rising, NP   BP 112/63 mmHg  Pulse 77  Temp(Src) 97.4 F (36.3 C) (Oral)  Resp 18  SpO2 100% Physical Exam  Constitutional: He is oriented to person, place, and time.  Vital signs are normal. He appears well-developed and well-nourished.  Non-toxic appearance. No distress.  Afebrile, nontoxic, NAD  HENT:  Head: Normocephalic and atraumatic.  Right Ear: Hearing and external ear normal. No tenderness. A foreign body (cerumen) is present.  Left Ear: Hearing and external ear normal. No tenderness. A foreign body (cerumen) is present.  Nose: Nose normal.  Mouth/Throat: Uvula is midline, oropharynx is clear and moist and mucous membranes are normal. No trismus in the jaw. No uvula swelling.   B/l cerumen impactions. No pain with pinna retraction, no mastoid tenderness or erythema. Unable to visualize TM due to cerumen impaction. R cerumen impaction pushed towards TM. Nose clear. Oropharynx clear.   Eyes: Conjunctivae and EOM are normal. Right eye exhibits no discharge. Left eye exhibits no discharge.  Neck: Normal range of motion. Neck supple.  Cardiovascular: Normal rate.   Pulmonary/Chest: Effort normal. No respiratory distress.  Abdominal: Normal appearance. He exhibits no distension.  Musculoskeletal: Normal range of motion.  Neurological: He is alert and oriented to person, place, and time. He has normal strength. No sensory deficit.  Skin: Skin is warm, dry and intact. No rash noted.  Psychiatric: He has a normal mood and affect.  Nursing note and vitals reviewed.   ED Course  Procedures (including critical care time)  DIAGNOSTIC STUDIES: Oxygen Saturation is 100% on room air, normal by my interpretation.    COORDINATION OF CARE: 5:55 PM Discussed treatment plan with pt at bedside and pt agreed to plan.   Labs Review Labs Reviewed - No data to display  Imaging Review No results found.   EKG Interpretation None      MDM   Final diagnoses:  Cerumen impaction, bilateral    19 y.o. male here with b/l cerumen impactions. He has pushed the R cerumen impaction towards the TM. Will need to soften the wax prior to removal. Will do this with debrox and have him f/up with PCP in 1wk for recheck and cerumen removal. No pain or drainage, doubt infectious causes. I explained the diagnosis and have given explicit precautions to return to the ER including for any other new or worsening symptoms. The patient understands and accepts the medical plan as it's been dictated and I have answered their questions. Discharge instructions concerning home care and prescriptions have been given. The patient is STABLE and is discharged to home in good condition.   I personally  performed the services described in this documentation, which was scribed in my presence. The recorded information has been reviewed and is accurate.  BP 112/63 mmHg  Pulse 77  Temp(Src) 97.4 F (36.3 C) (Oral)  Resp 18  SpO2 100%  Meds ordered this encounter  Medications  . carbamide peroxide (DEBROX) 6.5 % otic solution    Sig: Place 5 drops into both ears 2 (two) times daily. X 5 days    Dispense:  15 mL    Refill:  1    Order Specific Question:  Supervising Provider    Answer:  Eber Hong [[id number]]      Drishti Pepperman Camprubi-Soms, PA-C 02/21/15 1809  Toy Cookey, MD 02/22/15 1112

## 2015-02-21 NOTE — Discharge Instructions (Signed)
Use debrox drops as directed to help soften the ear wax. Follow up with your regular doctor in 1 week for recheck of symptoms and possibly to have your ears flushed. DO NOT USE Q-TIPS. Return to the ER for changes or worsening symptoms.   Cerumen Impaction A cerumen impaction is when the wax in your ear forms a plug. This plug usually causes reduced hearing. Sometimes it also causes an earache or dizziness. Removing a cerumen impaction can be difficult and painful. The wax sticks to the ear canal. The canal is sensitive and bleeds easily. If you try to remove a heavy wax buildup with a cotton tipped swab, you may push it in further. Irrigation with water, suction, and small ear curettes may be used to clear out the wax. If the impaction is fixed to the skin in the ear canal, ear drops may be needed for a few days to loosen the wax. People who build up a lot of wax frequently can use ear wax removal products available in your local drugstore. SEEK MEDICAL CARE IF:  You develop an earache, increased hearing loss, or marked dizziness. Document Released: 10/01/2004 Document Revised: 11/16/2011 Document Reviewed: 11/21/2009 [facility] Patient Information 2015 [place], [place]. This information is not intended to replace advice given to you by your health care provider. Make sure you discuss any questions you have with your health care provider.  Ear Drops You need to put eardrops in your ear. HOME CARE   Put drops in your affected ear as told.  After putting in the drops, lie down with the ear you put the drops in facing up. Stay this way for 10 minutes. Use the ear drops as long as your doctor tells you.  Before you get up, put a cotton ball gently in your ear. Do not push it far in your ear.  Do not wash out your ears unless your doctor says it is okay.  Finish all medicines as told by your doctor. You may be told to keep using the eardrops even if you start to feel better.  See your doctor as  told for follow-up visits. GET HELP IF:  You have pain that gets worse.  Any unusual fluid (drainage) is coming from your ear (especially if the fluid stinks).  You have trouble hearing.  You get really dizzy as if the room is spinning and feel sick to your stomach (vertigo).  The outside of your ear becomes red or puffy or both. This may be a sign of an allergic reaction. MAKE SURE YOU:   Understand these instructions.  Will watch your condition.  Will get help right away if you are not doing well or get worse. Document Released: 02/11/2010 Document Revised: 08/29/2013 Document Reviewed: 03/21/2013 [facility] Patient Information 2015 [place], [place]. This information is not intended to replace advice given to you by your health care provider. Make sure you discuss any questions you have with your health care provider.

## 2015-02-21 NOTE — ED Notes (Signed)
Pt presents with c/o cerumen impaction on the right side since Monday. Pt reports no foreign object in his ear, just the cerumen.

## 2015-03-07 ENCOUNTER — Telehealth: Payer: Self-pay

## 2015-03-07 NOTE — Telephone Encounter (Signed)
Franklin [name], dad, called stating that child does not have insurance. He said that [facility] told father that patient would be approved for free Lamictal after paperwork has been filled out by provider. Franklin [name] said that he will be dropping the paperwork off at our office today.  Dad said Franklin [name] has been taking generic version, Lamotrigine XR 250 mg, pt sig:1 tab po q am & 2 tabs po q pm. He will need a Rx for brand name Lamictal XR 250 mg tabs in order for patient to receive the medication from the program. Franklin [name] is aware that [name] is out of the office today. He is also aware that this process may take a few business days. He asked that our office call him when the paperwork and Rx are ready for pick up. Franklin [name] can be reached at: [phone].  Child was seen by [name] on 12-24-14. Has a recall set for 06-25-15.

## 2015-03-08 ENCOUNTER — Encounter (HOSPITAL_COMMUNITY): Payer: Self-pay

## 2015-03-08 ENCOUNTER — Emergency Department (HOSPITAL_COMMUNITY)
Admission: EM | Admit: 2015-03-08 | Discharge: 2015-03-08 | Disposition: A | Discharge status: Home / Self Care | Payer: Medicaid Other | Attending: Emergency Medicine | Admitting: Emergency Medicine

## 2015-03-08 DIAGNOSIS — G40909 Epilepsy, unspecified, not intractable, without status epilepticus: Secondary | ICD-10-CM | POA: Insufficient documentation

## 2015-03-08 DIAGNOSIS — G40309 Generalized idiopathic epilepsy and epileptic syndromes, not intractable, without status epilepticus: Secondary | ICD-10-CM

## 2015-03-08 DIAGNOSIS — Z760 Encounter for issue of repeat prescription: Secondary | ICD-10-CM | POA: Insufficient documentation

## 2015-03-08 DIAGNOSIS — Z7982 Long term (current) use of aspirin: Secondary | ICD-10-CM | POA: Insufficient documentation

## 2015-03-08 DIAGNOSIS — Z789 Other specified health status: Secondary | ICD-10-CM

## 2015-03-08 LAB — CBC WITH DIFFERENTIAL/PLATELET
BASOS PCT: 0 % (ref 0–1)
Basophils Absolute: 0 10*3/uL (ref 0.0–0.1)
Eosinophils Absolute: 0.2 10*3/uL (ref 0.0–0.7)
Eosinophils Relative: 2 % (ref 0–5)
HEMATOCRIT: 53 % — AB (ref 39.0–52.0)
HEMOGLOBIN: 17.7 g/dL — AB (ref 13.0–17.0)
LYMPHS ABS: 4 10*3/uL (ref 0.7–4.0)
Lymphocytes Relative: 44 % (ref 12–46)
MCH: 31.2 pg (ref 26.0–34.0)
MCHC: 33.4 g/dL (ref 30.0–36.0)
MCV: 93.5 fL (ref 78.0–100.0)
Monocytes Absolute: 0.9 10*3/uL (ref 0.1–1.0)
Monocytes Relative: 10 % (ref 3–12)
NEUTROS ABS: 4 10*3/uL (ref 1.7–7.7)
NEUTROS PCT: 44 % (ref 43–77)
PLATELETS: 313 10*3/uL (ref 150–400)
RBC: 5.67 MIL/uL (ref 4.22–5.81)
RDW: 12.6 % (ref 11.5–15.5)
WBC: 9.1 10*3/uL (ref 4.0–10.5)

## 2015-03-08 LAB — BASIC METABOLIC PANEL
ANION GAP: 26 — AB (ref 5–15)
BUN: 11 mg/dL (ref 6–20)
CALCIUM: 10 mg/dL (ref 8.9–10.3)
CO2: 15 mmol/L — ABNORMAL LOW (ref 22–32)
CREATININE: 1.15 mg/dL (ref 0.61–1.24)
Chloride: 101 mmol/L (ref 101–111)
GFR calc Af Amer: >60 mL/min (ref >60)
GLUCOSE: 125 mg/dL — AB (ref 65–99)
POTASSIUM: 3.3 mmol/L — AB (ref 3.5–5.1)
Sodium: 142 mmol/L (ref 135–145)

## 2015-03-08 LAB — CBG MONITORING, ED: Glucose-Capillary: 105 mg/dL — ABNORMAL HIGH (ref 65–99)

## 2015-03-08 MED ORDER — LAMOTRIGINE 100 MG PO TABS
250.0000 mg | ORAL_TABLET | Freq: Once | ORAL | Status: AC
  Administered 2015-03-08: 250 mg via ORAL
  Filled 2015-03-08: qty 3

## 2015-03-08 MED ORDER — LAMOTRIGINE ER 250 MG PO TB24: ORAL_TABLET | ORAL | Status: DC

## 2015-03-08 MED ORDER — LAMOTRIGINE ER 250 MG PO TB24: 250.0000 mg | ORAL_TABLET | Freq: Two times a day (BID) | ORAL | Status: DC

## 2015-03-08 NOTE — ED Notes (Signed)
Family at bedside. 

## 2015-03-08 NOTE — ED Notes (Addendum)
Pt brought to the ER via dad. Pt pulled out of car by nurses. Dad told them, "this one was different" and was parking the car. Pt has a hx of seizures. Triage nurse said pt was drooling when pulled from the car. Dad states his whole body tensed up. Usually he shakes. He has been out of his medication for 4 days. Lost his medicaid when he turned 19.

## 2015-03-08 NOTE — Progress Notes (Signed)
Dear _________[name]________________:  Bonita QuinYou have been approved to have the prescriptions written by your discharging physician filled through our [facility] ([facility]) program. This program allows for a one-time (no refills) 34-day supply of selected medications for a low copay amount.  The copay is $3.00 per prescription. For instance, if you have one prescription, you will pay $3.00; for two prescriptions, you pay $6.00; for three prescriptions, you pay $9.00; and so on.  Only certain pharmacies are participating in this program with [facility]. You will need to select one of the pharmacies from the attached list and take your prescriptions, this letter, and your photo ID to one of the participating pharmacies.   We are excited that you are able to use the [facility] program to get your medications. These prescriptions must be filled within 7 days of hospital discharge or they will no longer be valid for the [facility] program. Should you have any problems with your prescriptions please contact your case management team member at [phone].  Thank you,   [place] MATCH PHARMACIES    [facility], [address], [place], [place] [facility], [address], [place], [place] [facility],[address], [address], [facility], [facility], [address], [place], [place] [id number]  Other Pharmacies [facility], [address], [place], [place] [address] [facility], [address], [place], [place] [facility], [address], [address], [place], [place]  [facility] [address], [place], [place]   [id number] [address], [place], [place]  [address] [place], [place], [place]   [address], [place], [place] [address], [place], [place]   [address], [place], [place] [address], [place], [place]   [address], [place], [place] [id number], [place], [place]  [id number] [address], [place], [place] [id number] Rank[address], [place], [place]         [employer]       [address] [place] [address], [place], [place]       [id number], [place], [place] [id number] Pyramid [address], [place], [place]                                      [address], [place], [place] [id number] [address], [place], [place]                                    [address], [place], [place] [address], [place], [place]                                                                  [address]26 [address], [place] [id number] [address], [place], [place]  [address] [place], [place], [place], [place], [place], [place], [place], [place], [place] [address], [place], [place], [place], [place], [place], [address] [place], [place], [place] [address] [place], [place]                                    [place], [place], [place], [place], [place], Woodward [place] [address], East [place], [place], [place], [place], [place], M[place] So-Hi, Al[place] Border, Emb[place] [address], [address], [place] [address] [place], [place] [address] [place], [place] Lolita, [place], [place], [place], Brock Rayle, [place], [place] Aid [address] [place], [place], [place], [place] 4132 [address], [place], [place], [place], [place]  [address] [address], [place], [place], [place], [place], [place], [place]    Nathalie, Springwater [place]ony, [place] North, [place], Desha [address], [place], [place]  [address], [place], [place] [address], [place], [place] [address] [place], [place] [address], [place], Marl[place] NIKE, [place], [place]    [place], [place], [place], [place], [place]                  [place], [place], [place]

## 2015-03-08 NOTE — Discharge Instructions (Signed)

## 2015-03-08 NOTE — ED Notes (Signed)
Dr. Pfeiffer at the bedside.  

## 2015-03-08 NOTE — Care Management Note (Addendum)
Case Management Note  Patient Details  Name: Majel HomerDustin R Cabiness MRN: [id number] Date of Birth: 12/27/1995  Subjective/Objective:          19 yr old male previously with medicaid coverage Now no longer covered related age 19, Had seizure today, Attempting to get new uninsured MD- taking lamictal ER  Father confirmed with CM pt [facility] but has not had appt yet Pt has been seeing Dr Sharene [place]hickling, neurologist when had medicaid and was [place] the way to [place] office today to get a Rx to complete [facility] [facility] application Reports they applied for adult medicaid "last week"     Action/Plan: Spoke with ED RN, Mayme GentaHayley and EDP Pfeiffer about pt and med assistance Consulted with CM Chiropodistassistant director Agreed on MATCH assist available Spoke again with EDP Pfeiffer about 60 versus 90 total tab supply for pt with pt agreeing to f/u with pcp or specialist soon to get assistance for medication via drug company pt assistance program 1530 sent inquiry to [facility]s x 2 about possible assist with this pt & candidacy for transitional care program 1535 faxed MATCH letter and copy of written uninsured [place] resources to x [phone] with confirmation Spoke with ED RN  [phone] Cm spoke with pt & father to review MATCH assistance and to assess for pcp, neurologist  CM reviewed EPIC notes and chart review information CM spoke with the pt about [facility] MATCH program ($3 co pay for each Rx through [facility], does not include refills, 7 day expiration of MATCH letter and choice of pharmacies) Pt agreed to receive assistance from program    Pt is eligible for [facility]ATCH program (unable to find pt listed in PDMI per cardholder name inquiry)  PDMI information entered. MATCH letter completed and provided to pt.   CM updated EDP and ED RN   CM discussed and provided written information for self pay pcps, importance of pcp for f/u care, www.needymeds.org, discounted pharmacies and other [place] resources such as financial assistance, DSS and  health department  Reviewed resources for [place] self pay pcps like goodrx, [place] [facility], [facility], visiting doctors, outpatient pharmacies, [facility], [facility], [facility], general medical clinics, [facility] urgent care plus others, [facility] out [facility], housing, and other resources in [place]. Pt & father voiced understanding and appreciation of resources provided    1610 Spoke with Jessica [facility] Cm,  Pt is not a candidate for transitional care program but possible available pt appt for regular [facility] pt Informed her that pt [facility] Discussed a dispensary of hope  1700 CM received a [facility]an [facility] to get assist to clarify Lamictal Rx to be 250 mg in am and 500 mg total (2 tabs) as written on pt lamictal   Expected Discharge Date:    March 08 2015               Expected Discharge Plan:    home with [facility]ATCH assistance   Status of Service:   completed    Additional Comments:  Ophelia ShoulderGibbs, Ayrton Mcvay Louise, RN 03/08/2015, 3:24 PM

## 2015-03-08 NOTE — ED Provider Notes (Signed)
CSN: [id number]     Arrival date & time 03/08/15  1322 History   First MD Initiated Contact with Patient 03/08/15 1438     Chief Complaint  Patient presents with  . Seizures     (Consider location/radiation/quality/duration/timing/severity/associated sxs/prior Treatment) HPI Patient ran out of his Lamictal approximately 4 days ago. His father noted him to have a seizure today that he said looked different. Usually his seizures are more for tonic-clonic shaking type. This time his dad reports that he was stiff and drooling. The patient is now back to normal. The patient was last time he had a seizure was a couple months ago. He states the reason he has a male he is medications is because his Medicaid ran out when he turned 19. The patient denies he's been ill with fever, chills or other general illness recently. Patient reports at this time he feels back to normal. Past Medical History  Diagnosis Date  . Seizures   . Prematurity   . Retinopathy of prematurity    Past Surgical History  Procedure Laterality Date  . Central line placement      As premature infant  . Refractive surgery      For retinopathy of prematurity   Family History  Problem Relation Age of Onset  . Autism Brother     Also has cognitive delays  . Diabetes type II Father   . Hypercholesterolemia Father   . Hypertension Father   . Stroke Paternal Grandmother   . Stroke Paternal Aunt    History  Substance Use Topics  . Smoking status: Never Smoker   . Smokeless tobacco: Never Used  . Alcohol Use: No    Review of Systems 10 Systems reviewed and are negative for acute change except as noted in the HPI.    Allergies  Concerta  Home Medications   Prior to Admission medications   Medication Sig Start Date End Date Taking? Authorizing Provider  aspirin 325 MG tablet Take 650 mg by mouth as needed. Headache    Historical Provider, MD  carbamide peroxide (DEBROX) 6.5 % otic solution Place 5 drops into both  ears 2 (two) times daily. X 5 days 02/21/15   Mercedes Camprubi-Soms, PA-C  LamoTRIgine 250 MG TB24 Take 250 mg by mouth 2 (two) times daily. One tablet in the morning and 2 tablets in the evening 03/08/15   Arby Barrette, MD   BP 109/60 mmHg  Pulse 87  Temp(Src) 99.5 F (37.5 C) (Oral)  Resp 16  Ht  (1.753 m)  Wt 125 lb (56.7 kg)  BMI 18.45 kg/m2  SpO2 100% Physical Exam  Constitutional: He is oriented to person, place, and time. He appears well-developed and well-nourished.  HENT:  Head: Normocephalic and atraumatic.  Eyes: EOM are normal. Pupils are equal, round, and reactive to light.  Neck: Neck supple.  Cardiovascular: Normal rate, regular rhythm, normal heart sounds and intact distal pulses.   Pulmonary/Chest: Effort normal and breath sounds normal.  Abdominal: Soft. Bowel sounds are normal. He exhibits no distension. There is no tenderness.  Musculoskeletal: Normal range of motion. He exhibits no edema.  Neurological: He is alert and oriented to person, place, and time. He has normal strength. No cranial nerve deficit. He exhibits normal muscle tone. Coordination normal. GCS eye subscore is 4. GCS verbal subscore is 5. GCS motor subscore is 6.  Skin: Skin is warm, dry and intact.  Psychiatric: He has a normal mood and affect.    ED  Course  Procedures (including critical care time) Labs Review Labs Reviewed  BASIC METABOLIC PANEL - Abnormal; Notable for the following:    Potassium 3.3 (*)    CO2 15 (*)    Glucose, Bld 125 (*)    Anion gap 26 (*)    All other components within normal limits  CBC WITH DIFFERENTIAL/PLATELET - Abnormal; Notable for the following:    Hemoglobin 17.7 (*)    HCT 53.0 (*)    All other components within normal limits  CBG MONITORING, ED - Abnormal; Notable for the following:    Glucose-Capillary 105 (*)    All other components within normal limits    Imaging Review No results found.   EKG Interpretation None      MDM   Final  diagnoses:  Seizure disorder  Has run out of medications   Patient shows no evidence of acute injury. No recent illness or review systems. The patient has known seizure disorder and has been off of medications for roughly 4 days. Social work was consults it and we have been able to obtain 60 Lamictal tablets for the patient to take until he can establish a neurologic follow-up to either find a more affordable medication for him or other resources for pain for his medication. The patient and father are well aware of the urgency for finding these resources.    Arby BarretteMarcy Linzey Ramser, MD 03/08/15 [phone]

## 2015-03-08 NOTE — ED Notes (Signed)
Handed patient and his father the fax from social work. Also called security to assist pt to outpatient pharmacy.

## 2015-03-12 ENCOUNTER — Other Ambulatory Visit: Payer: Self-pay | Admitting: Family

## 2015-03-12 DIAGNOSIS — G40309 Generalized idiopathic epilepsy and epileptic syndromes, not intractable, without status epilepticus: Secondary | ICD-10-CM

## 2015-03-12 MED ORDER — LAMICTAL XR 250 MG PO TB24: ORAL_TABLET | ORAL | Status: DC

## 2015-03-13 NOTE — Telephone Encounter (Signed)
Dad came in yesterday with the paperwork. I explained to Dad that without proof of income that the patient assistance program would not approve it. He will try to get something and return it to me. TG

## 2015-03-13 NOTE — Telephone Encounter (Signed)
Dad brought in financial documents today. I faxed and mailed [employer] application. TG

## 2015-03-14 ENCOUNTER — Inpatient Hospital Stay: Payer: Medicaid Other | Admitting: Internal Medicine

## 2015-03-14 ENCOUNTER — Telehealth: Payer: Self-pay | Admitting: *Deleted

## 2015-03-14 MED ORDER — LAMICTAL XR 250 MG PO TB24: ORAL_TABLET | ORAL | Status: DC

## 2015-03-14 NOTE — Telephone Encounter (Signed)
Called to speak to dad but he was not home and dad's mother said that she did not know where to reach him. I will try again before the end of the day.

## 2015-03-14 NOTE — Telephone Encounter (Signed)
Called and spoke to grandmother Wallene HuhMary Corella and asked her to have dad call me back before 5 or tomorrow morning.

## 2015-03-14 NOTE — Telephone Encounter (Addendum)
Please let Dad know that I talked with the [employer] program. [name] has been approved and can pick up a 1 month supply at the pharmacy. This is to allow time for the 90 day mail order supply to be shipped to him. Tell him that I faxed the Rx to the pharmacy and to check with [facility] to see when Rx will be ready. Alos, please let him know that we do not have samples of this medication. Thanks, [name]

## 2015-03-14 NOTE — Telephone Encounter (Signed)
Called dad's cell phone it the machine said the caller was not taking calls at this time. No option to leave voicemail.

## 2015-03-14 NOTE — Telephone Encounter (Signed)
Patients father called and left a voicemail inquiring about samples of Lamictal because Franklin [name] called them and told them that they have not received paperwork for medication and when they did it would take them 24-48 hours to process. He states patient only has enough medication for tomorrow and last time he had a seizure within 3 days. Please advise.

## 2015-03-15 NOTE — Telephone Encounter (Signed)
Called and spoke again to Franklin [name] and she states that Franklin [name] had just left and had no cell phone on him. I advised her that [name] and to please tell dad to pick it up so Franklin [name] would not be with out it. She states she would advise father and would tell him to call me back for further explanation of Franklin [name] to Access program.

## 2015-03-15 NOTE — Telephone Encounter (Signed)
Thank you for following up with the family. TG

## 2015-03-19 NOTE — Telephone Encounter (Signed)
Called and spoke to Franklin [name], patient's father, and further explained the original message. He verbalized understanding and stated that [facility].

## 2015-04-02 ENCOUNTER — Emergency Department (INDEPENDENT_AMBULATORY_CARE_PROVIDER_SITE_OTHER)
Admission: EM | Admit: 2015-04-02 | Discharge: 2015-04-02 | Disposition: A | Discharge status: Home / Self Care | Payer: Self-pay | Source: Home / Self Care | Attending: Emergency Medicine | Admitting: Emergency Medicine

## 2015-04-02 ENCOUNTER — Encounter (HOSPITAL_COMMUNITY): Payer: Self-pay | Admitting: Emergency Medicine

## 2015-04-02 DIAGNOSIS — G40909 Epilepsy, unspecified, not intractable, without status epilepticus: Secondary | ICD-10-CM

## 2015-04-02 MED ORDER — LAMICTAL XR 250 MG PO TB24: ORAL_TABLET | ORAL | Status: DC

## 2015-04-02 NOTE — ED Provider Notes (Signed)
CSN: [id number]     Arrival date & time 04/02/15  1319 History   First MD Initiated Contact with Patient 04/02/15 1407     Chief Complaint  Patient presents with  . Medication Refill   (Consider location/radiation/quality/duration/timing/severity/associated sxs/prior Treatment) HPI  He is a 19 year old man here with his dad for medication refill. He has a history of seizures. He is on Lamictal. He has been well-controlled on this medicine for at least one year. He did have a seizure the beginning of this month after being without his medicine for 4 days. He is having difficulty getting his medication at this time as he aged out of pediatric Medicaid and is having trouble getting his adult Medicaid and disability set up.  Past Medical History  Diagnosis Date  . Seizures   . Prematurity   . Retinopathy of prematurity    Past Surgical History  Procedure Laterality Date  . Central line placement      As premature infant  . Refractive surgery      For retinopathy of prematurity   Family History  Problem Relation Age of Onset  . Autism Brother     Also has cognitive delays  . Diabetes type II Father   . Hypercholesterolemia Father   . Hypertension Father   . Stroke Paternal Grandmother   . Stroke Paternal Aunt    History  Substance Use Topics  . Smoking status: Never Smoker   . Smokeless tobacco: Never Used  . Alcohol Use: No    Review of Systems As in history of present illness Allergies  Concerta  Home Medications   Prior to Admission medications   Medication Sig Start Date End Date Taking? Authorizing Provider  aspirin 325 MG tablet Take 650 mg by mouth as needed. Headache    Historical Provider, MD  carbamide peroxide (DEBROX) 6.5 % otic solution Place 5 drops into both ears 2 (two) times daily. X 5 days 02/21/15   Mercedes Camprubi-Soms, PA-C  LAMICTAL XR 250 MG TB24 Take 1 tablet in the morning and take 2 tablets at night 04/02/15   Charm Rings, MD   BP 111/68  mmHg  Pulse 62  Temp(Src) 98.2 F (36.8 C) (Oral)  Resp 16  SpO2 100% Physical Exam  Constitutional: He is oriented to person, place, and time. He appears well-developed and well-nourished. No distress.  Cardiovascular: Normal rate.   Pulmonary/Chest: Effort normal.  Neurological: He is alert and oriented to person, place, and time.    ED Course  Procedures (including critical care time) Labs Review Labs Reviewed - No data to display  Imaging Review No results found.   MDM   1. Seizure disorder    I provided a prescription for his Lamictal. His dad states he is working with a Sports coach to get the medication filled.    Charm Rings, MD 04/02/15 1434

## 2015-04-02 NOTE — Discharge Instructions (Signed)
I provided a prescription for Lamictal with 1 refill. Please follow-up with his neurologist as needed. If he has a seizure, please go to the emergency room.

## 2015-04-02 NOTE — ED Notes (Signed)
Pt is in the process of changing from adolescent Medicaid to adult Medicaid.  He took his last dose of Lamictal this morning and is unable to get a refill until he gets his new Medicaid straightened out.

## 2015-04-06 ENCOUNTER — Emergency Department (HOSPITAL_COMMUNITY)
Admission: EM | Admit: 2015-04-06 | Discharge: 2015-04-06 | Disposition: A | Discharge status: Home / Self Care | Payer: Self-pay | Attending: Emergency Medicine | Admitting: Emergency Medicine

## 2015-04-06 ENCOUNTER — Encounter (HOSPITAL_COMMUNITY): Payer: Self-pay | Admitting: Emergency Medicine

## 2015-04-06 DIAGNOSIS — Z7982 Long term (current) use of aspirin: Secondary | ICD-10-CM | POA: Insufficient documentation

## 2015-04-06 DIAGNOSIS — G40909 Epilepsy, unspecified, not intractable, without status epilepticus: Secondary | ICD-10-CM | POA: Insufficient documentation

## 2015-04-06 DIAGNOSIS — Z8669 Personal history of other diseases of the nervous system and sense organs: Secondary | ICD-10-CM | POA: Insufficient documentation

## 2015-04-06 DIAGNOSIS — R569 Unspecified convulsions: Secondary | ICD-10-CM

## 2015-04-06 LAB — CBC WITH DIFFERENTIAL/PLATELET
BASOS ABS: 0 10*3/uL (ref 0.0–0.1)
BASOS PCT: 0 % (ref 0–1)
Eosinophils Absolute: 0.1 10*3/uL (ref 0.0–0.7)
Eosinophils Relative: 1 % (ref 0–5)
HEMATOCRIT: 45.9 % (ref 39.0–52.0)
HEMOGLOBIN: 16 g/dL (ref 13.0–17.0)
Lymphocytes Relative: 25 % (ref 12–46)
Lymphs Abs: 1.8 10*3/uL (ref 0.7–4.0)
MCH: 31.6 pg (ref 26.0–34.0)
MCHC: 34.9 g/dL (ref 30.0–36.0)
MCV: 90.7 fL (ref 78.0–100.0)
Monocytes Absolute: 0.7 10*3/uL (ref 0.1–1.0)
Monocytes Relative: 9 % (ref 3–12)
NEUTROS ABS: 4.6 10*3/uL (ref 1.7–7.7)
NEUTROS PCT: 65 % (ref 43–77)
PLATELETS: 264 10*3/uL (ref 150–400)
RBC: 5.06 MIL/uL (ref 4.22–5.81)
RDW: 12.3 % (ref 11.5–15.5)
WBC: 7.1 10*3/uL (ref 4.0–10.5)

## 2015-04-06 LAB — BASIC METABOLIC PANEL
Anion gap: 7 (ref 5–15)
BUN: 11 mg/dL (ref 6–20)
CHLORIDE: 105 mmol/L (ref 101–111)
CO2: 27 mmol/L (ref 22–32)
Calcium: 8.9 mg/dL (ref 8.9–10.3)
Creatinine, Ser: 0.8 mg/dL (ref 0.61–1.24)
Glucose, Bld: 89 mg/dL (ref 65–99)
Potassium: 3.4 mmol/L — ABNORMAL LOW (ref 3.5–5.1)
SODIUM: 139 mmol/L (ref 135–145)

## 2015-04-06 LAB — CBG MONITORING, ED: Glucose-Capillary: 82 mg/dL (ref 65–99)

## 2015-04-06 MED ORDER — LAMOTRIGINE ER 250 MG PO TB24: 1.0000 | ORAL_TABLET | Freq: Once | ORAL | Status: DC

## 2015-04-06 MED ORDER — LAMOTRIGINE 25 MG PO TABS
125.0000 mg | ORAL_TABLET | Freq: Once | ORAL | Status: AC
  Administered 2015-04-06: 125 mg via ORAL
  Filled 2015-04-06: qty 1

## 2015-04-06 MED ORDER — LAMICTAL XR 250 MG PO TB24: ORAL_TABLET | ORAL | Status: DC

## 2015-04-06 NOTE — ED Provider Notes (Signed)
CSN: [id number]     Arrival date & time 04/06/15  1608 History   First MD Initiated Contact with Patient 04/06/15 1624     Chief Complaint  Patient presents with  . Seizures     (Consider location/radiation/quality/duration/timing/severity/associated sxs/prior Treatment) HPI Comments: Patient presents to the emergency department with chief complaints of pre-seizure aura. Patient brought in by EMS. States that he has been having seizures which are generally tonic-clonic since 2008. Patient normally takes Lamictal, but has been out of his medication for the past 5 days. He did not have a seizure today, but felt that he was going to have one. Patient states that he has been unable to afford his Lamictal. He denies any other associated symptoms. Denies fever, chills, cough, or other recent illness.  The history is provided by the patient. No language interpreter was used.    Past Medical History  Diagnosis Date  . Seizures   . Prematurity   . Retinopathy of prematurity    Past Surgical History  Procedure Laterality Date  . Central line placement      As premature infant  . Refractive surgery      For retinopathy of prematurity   Family History  Problem Relation Age of Onset  . Autism Brother     Also has cognitive delays  . Diabetes type II Father   . Hypercholesterolemia Father   . Hypertension Father   . Stroke Paternal Grandmother   . Stroke Paternal Aunt    History  Substance Use Topics  . Smoking status: Never Smoker   . Smokeless tobacco: Never Used  . Alcohol Use: No    Review of Systems  Constitutional: Negative for fever and chills.  Respiratory: Negative for shortness of breath.   Cardiovascular: Negative for chest pain.  Gastrointestinal: Negative for nausea, vomiting, diarrhea and constipation.  Genitourinary: Negative for dysuria.  Neurological: Positive for seizures.      Allergies  Concerta  Home Medications   Prior to Admission medications    Medication Sig Start Date End Date Taking? Authorizing Provider  aspirin 325 MG tablet Take 650 mg by mouth daily as needed for headache. Headache   Yes Historical Provider, MD  LAMICTAL XR 250 MG TB24 Take 1 tablet in the morning and take 2 tablets at night 04/02/15  Yes Charm Rings, MD  carbamide peroxide (DEBROX) 6.5 % otic solution Place 5 drops into both ears 2 (two) times daily. X 5 days Patient not taking: Reported on 04/06/2015 02/21/15   Mercedes Camprubi-Soms, PA-C   BP 111/64 mmHg  Pulse 57  Temp(Src) 98.3 F (36.8 C) (Oral)  Resp 14  SpO2 99% Physical Exam  Constitutional: He is oriented to person, place, and time. He appears well-developed and well-nourished.  HENT:  Head: Normocephalic and atraumatic.  Eyes: Conjunctivae and EOM are normal. Pupils are equal, round, and reactive to light. Right eye exhibits no discharge. Left eye exhibits no discharge. No scleral icterus.  Neck: Normal range of motion. Neck supple. No JVD present.  Cardiovascular: Normal rate, regular rhythm and normal heart sounds.  Exam reveals no gallop and no friction rub.   No murmur heard. Pulmonary/Chest: Effort normal and breath sounds normal. No respiratory distress. He has no wheezes. He has no rales. He exhibits no tenderness.  Abdominal: Soft. He exhibits no distension and no mass. There is no tenderness. There is no rebound and no guarding.  Musculoskeletal: Normal range of motion. He exhibits no edema or tenderness.  Moves all extremities  Neurological: He is alert and oriented to person, place, and time.  CN III-12 intact, normal sensation and strength, no pronator drift  Skin: Skin is warm and dry.  Psychiatric: He has a normal mood and affect. His behavior is normal. Judgment and thought content normal.  Nursing note and vitals reviewed.   ED Course  Procedures (including critical care time) Results for orders placed or performed during the hospital encounter of 04/06/15  CBC with  Differential/Platelet  Result Value Ref Range   WBC 7.1 4.0 - 10.5 K/uL   RBC 5.06 4.22 - 5.81 MIL/uL   Hemoglobin 16.0 13.0 - 17.0 g/dL   HCT 40.9 81.1 - 91.4 %   MCV 90.7 78.0 - 100.0 fL   MCH 31.6 26.0 - 34.0 pg   MCHC 34.9 30.0 - 36.0 g/dL   RDW 78.2 95.6 - 21.3 %   Platelets 264 150 - 400 K/uL   Neutrophils Relative % 65 43 - 77 %   Neutro Abs 4.6 1.7 - 7.7 K/uL   Lymphocytes Relative 25 12 - 46 %   Lymphs Abs 1.8 0.7 - 4.0 K/uL   Monocytes Relative 9 3 - 12 %   Monocytes Absolute 0.7 0.1 - 1.0 K/uL   Eosinophils Relative 1 0 - 5 %   Eosinophils Absolute 0.1 0.0 - 0.7 K/uL   Basophils Relative 0 0 - 1 %   Basophils Absolute 0.0 0.0 - 0.1 K/uL  Basic metabolic panel  Result Value Ref Range   Sodium 139 135 - 145 mmol/L   Potassium 3.4 (L) 3.5 - 5.1 mmol/L   Chloride 105 101 - 111 mmol/L   CO2 27 22 - 32 mmol/L   Glucose, Bld 89 65 - 99 mg/dL   BUN 11 6 - 20 mg/dL   Creatinine, Ser 0.86 0.61 - 1.24 mg/dL   Calcium 8.9 8.9 - 57.8 mg/dL   GFR calc non Af Amer >60 >60 mL/min   GFR calc Af Amer >60 >60 mL/min   Anion gap 7 5 - 15  CBG monitoring, ED  Result Value Ref Range   Glucose-Capillary 82 65 - 99 mg/dL   No results found.   Imaging Review No results found.   EKG Interpretation None      MDM   Final diagnoses:  Seizures   Patient with seizures. Labs and workup are reassuring. He did not have a seizure today. He is out of his medications. I have left a message with the case manager, who will call the patient in the next day or so to offer assistance with medications.  Patient given dose of lamictal here.  Will DC to home.    Roxy Horseman, PA-C 04/06/15 1822  Gwyneth Sprout, MD 04/06/15 2252

## 2015-04-06 NOTE — Discharge Instructions (Signed)

## 2015-04-06 NOTE — ED Notes (Signed)
PER EMS- pt picked up from home c/o pre seizure aura today.  Reports last seizure 5days ago.  Reports not taking lamictal x5days, out of medication.

## 2015-04-06 NOTE — ED Notes (Signed)
Bed: WA20 Expected date: 04/06/15 Expected time: 4:10 PM Means of arrival: Ambulance Comments: seizure

## 2015-04-09 NOTE — Progress Notes (Signed)
[facility] ED Cm receive a voice message left from [facility] CM main office (megan) to call the pt related medication assistance Cm Reviewed EPIC notes for last ED encounter 04/06/15 c/o seizure seen by mid level R Browning. Cm spoke with Ree Shay on 04/08/15 about pt and mediation issues. R Browning recalls when pt's father came to take pt home and inquiry was made about pt need for medication and father stated pt had recently had medications delivered to the home via mail order Cm attempted to reach pt 04/08/15 and 04/09/15 at [phone] but without success related an automated message stating the message mail box is full for [phone]  Pt demographics in EPIC indicates   [address] [place], [place] [address] Home: [phone] Work:   Mobile: [phone]  Additional Patient Info   Email:    PCP: [facility]* Employment:  Not Employed             Patient Contacts OLUSEGUN GERSTENBERGER (Mother) Yecheskel Kurek (Father)  [phone]   No further contact number available

## 2015-06-27 ENCOUNTER — Telehealth: Payer: Self-pay | Admitting: Pediatrics

## 2015-06-27 DIAGNOSIS — G40309 Generalized idiopathic epilepsy and epileptic syndromes, not intractable, without status epilepticus: Secondary | ICD-10-CM

## 2015-06-27 MED ORDER — LAMICTAL XR 250 MG PO TB24: ORAL_TABLET | ORAL | Status: DC

## 2015-06-27 NOTE — Telephone Encounter (Signed)
Called by answering service to refill a 10 day prescription

## 2015-06-30 ENCOUNTER — Encounter (HOSPITAL_COMMUNITY): Payer: Self-pay | Admitting: Emergency Medicine

## 2015-06-30 ENCOUNTER — Emergency Department (HOSPITAL_COMMUNITY)
Admission: EM | Admit: 2015-06-30 | Discharge: 2015-06-30 | Disposition: A | Discharge status: Home / Self Care | Payer: Self-pay | Attending: Emergency Medicine | Admitting: Emergency Medicine

## 2015-06-30 ENCOUNTER — Emergency Department (HOSPITAL_COMMUNITY): Payer: Self-pay

## 2015-06-30 DIAGNOSIS — Z8669 Personal history of other diseases of the nervous system and sense organs: Secondary | ICD-10-CM | POA: Insufficient documentation

## 2015-06-30 DIAGNOSIS — Y9289 Other specified places as the place of occurrence of the external cause: Secondary | ICD-10-CM | POA: Insufficient documentation

## 2015-06-30 DIAGNOSIS — W06XXXA Fall from bed, initial encounter: Secondary | ICD-10-CM | POA: Insufficient documentation

## 2015-06-30 DIAGNOSIS — Y9389 Activity, other specified: Secondary | ICD-10-CM | POA: Insufficient documentation

## 2015-06-30 DIAGNOSIS — R4182 Altered mental status, unspecified: Secondary | ICD-10-CM | POA: Insufficient documentation

## 2015-06-30 DIAGNOSIS — S0083XA Contusion of other part of head, initial encounter: Secondary | ICD-10-CM | POA: Insufficient documentation

## 2015-06-30 DIAGNOSIS — R569 Unspecified convulsions: Secondary | ICD-10-CM | POA: Insufficient documentation

## 2015-06-30 DIAGNOSIS — G40309 Generalized idiopathic epilepsy and epileptic syndromes, not intractable, without status epilepticus: Secondary | ICD-10-CM

## 2015-06-30 DIAGNOSIS — Z23 Encounter for immunization: Secondary | ICD-10-CM | POA: Insufficient documentation

## 2015-06-30 DIAGNOSIS — Y998 Other external cause status: Secondary | ICD-10-CM | POA: Insufficient documentation

## 2015-06-30 LAB — CBC WITH DIFFERENTIAL/PLATELET
BASOS ABS: 0 10*3/uL (ref 0.0–0.1)
Basophils Relative: 0 %
Eosinophils Absolute: 0.1 10*3/uL (ref 0.0–0.7)
Eosinophils Relative: 1 %
HEMATOCRIT: 47.2 % (ref 39.0–52.0)
Hemoglobin: 16 g/dL (ref 13.0–17.0)
Lymphocytes Relative: 27 %
Lymphs Abs: 1.6 10*3/uL (ref 0.7–4.0)
MCH: 30.9 pg (ref 26.0–34.0)
MCHC: 33.9 g/dL (ref 30.0–36.0)
MCV: 91.1 fL (ref 78.0–100.0)
MONO ABS: 0.4 10*3/uL (ref 0.1–1.0)
Monocytes Relative: 8 %
Neutro Abs: 3.7 10*3/uL (ref 1.7–7.7)
Neutrophils Relative %: 64 %
PLATELETS: 267 10*3/uL (ref 150–400)
RBC: 5.18 MIL/uL (ref 4.22–5.81)
RDW: 12.5 % (ref 11.5–15.5)
WBC: 5.7 10*3/uL (ref 4.0–10.5)

## 2015-06-30 LAB — URINALYSIS, ROUTINE W REFLEX MICROSCOPIC
Bilirubin Urine: NEGATIVE
GLUCOSE, UA: NEGATIVE mg/dL
Hgb urine dipstick: NEGATIVE
KETONES UR: NEGATIVE mg/dL
Leukocytes, UA: NEGATIVE
NITRITE: NEGATIVE
Protein, ur: NEGATIVE mg/dL
Specific Gravity, Urine: 1.015 (ref 1.005–1.030)
UROBILINOGEN UA: 0.2 mg/dL (ref 0.0–1.0)
pH: 7 (ref 5.0–8.0)

## 2015-06-30 LAB — COMPREHENSIVE METABOLIC PANEL
ALBUMIN: 4.7 g/dL (ref 3.5–5.0)
ALT: 25 U/L (ref 17–63)
AST: 29 U/L (ref 15–41)
Alkaline Phosphatase: 77 U/L (ref 38–126)
Anion gap: 8 (ref 5–15)
BILIRUBIN TOTAL: 0.9 mg/dL (ref 0.3–1.2)
BUN: 10 mg/dL (ref 6–20)
CO2: 27 mmol/L (ref 22–32)
Calcium: 9.4 mg/dL (ref 8.9–10.3)
Chloride: 104 mmol/L (ref 101–111)
Creatinine, Ser: 0.82 mg/dL (ref 0.61–1.24)
GFR calc Af Amer: >60 mL/min (ref >60)
GFR calc non Af Amer: >60 mL/min (ref >60)
GLUCOSE: 105 mg/dL — AB (ref 65–99)
POTASSIUM: 3.5 mmol/L (ref 3.5–5.1)
Sodium: 139 mmol/L (ref 135–145)
TOTAL PROTEIN: 7.2 g/dL (ref 6.5–8.1)

## 2015-06-30 LAB — RAPID URINE DRUG SCREEN, HOSP PERFORMED
Amphetamines: NOT DETECTED
BARBITURATES: NOT DETECTED
Benzodiazepines: NOT DETECTED
Cocaine: NOT DETECTED
Opiates: NOT DETECTED
Tetrahydrocannabinol: NOT DETECTED

## 2015-06-30 MED ORDER — TETANUS-DIPHTH-ACELL PERTUSSIS 5-2.5-18.5 LF-MCG/0.5 IM SUSP
0.5000 mL | Freq: Once | INTRAMUSCULAR | Status: AC
  Administered 2015-06-30: 0.5 mL via INTRAMUSCULAR
  Filled 2015-06-30: qty 0.5

## 2015-06-30 MED ORDER — LAMOTRIGINE 25 MG PO TABS
50.0000 mg | ORAL_TABLET | ORAL | Status: AC
  Administered 2015-06-30: 50 mg via ORAL
  Filled 2015-06-30: qty 2

## 2015-06-30 MED ORDER — SODIUM CHLORIDE 0.9 % IV BOLUS (SEPSIS)
1000.0000 mL | Freq: Once | INTRAVENOUS | Status: AC
  Administered 2015-06-30: 1000 mL via INTRAVENOUS

## 2015-06-30 MED ORDER — LAMICTAL XR 250 MG PO TB24: ORAL_TABLET | ORAL | Status: DC

## 2015-06-30 MED ORDER — ONDANSETRON HCL 4 MG/2ML IJ SOLN
4.0000 mg | Freq: Once | INTRAMUSCULAR | Status: AC
  Administered 2015-06-30: 4 mg via INTRAVENOUS
  Filled 2015-06-30: qty 2

## 2015-06-30 MED ORDER — LAMOTRIGINE ER 100 MG PO TB24
200.0000 mg | ORAL_TABLET | Freq: Once | ORAL | Status: AC
  Administered 2015-06-30: 200 mg via ORAL
  Filled 2015-06-30: qty 2

## 2015-06-30 NOTE — ED Notes (Signed)
Patient transported to CT 

## 2015-06-30 NOTE — ED Provider Notes (Signed)
CSN: [id number]     Arrival date & time 06/30/15  1541 History   First MD Initiated Contact with Patient 06/30/15 1550     Chief Complaint  Patient presents with  . Seizures     (Consider location/radiation/quality/duration/timing/severity/associated sxs/prior Treatment) HPI Comments: Level V caveat for mental status change. Patient presents via EMS after witnessed seizure. States he fell out of bed and struck his head. His cousin sign have a seizure of unknown known type. Patient does have a seizure history and takes Lamictal. He admits he's been out for the past 2 days. Denies any fever, chills, nausea or vomiting. No recent illnesses. No chest pain or shortness of breath. No abdominal pain. No focal weakness, numbness or tingling. No bowel or bladder incontinence. No tongue biting. He denies any illicit drug use.  The history is provided by the patient and the EMS personnel. The history is limited by the condition of the patient.    Past Medical History  Diagnosis Date  . Seizures (HCC)   . Prematurity   . Retinopathy of prematurity    Past Surgical History  Procedure Laterality Date  . Central line placement      As premature infant  . Refractive surgery      For retinopathy of prematurity   Family History  Problem Relation Age of Onset  . Autism Brother     Also has cognitive delays  . Diabetes type II Father   . Hypercholesterolemia Father   . Hypertension Father   . Stroke Paternal Grandmother   . Stroke Paternal Aunt    Social History  Substance Use Topics  . Smoking status: Never Smoker   . Smokeless tobacco: Never Used  . Alcohol Use: No    Review of Systems  Unable to perform ROS: Mental status change  Neurological: Positive for seizures.      Allergies  Concerta  Home Medications   Prior to Admission medications   Medication Sig Start Date End Date Taking? Authorizing Provider  carbamide peroxide (DEBROX) 6.5 % otic solution Place 5 drops into  both ears 2 (two) times daily. X 5 days Patient not taking: Reported on 04/06/2015 02/21/15   Mercedes Camprubi-Soms, PA-C  LAMICTAL XR 250 MG TB24 Take 1 tablet in the morning and take 2 tablets at night 06/30/15   Glynn Octave, MD   BP 108/63 mmHg  Pulse 68  Temp(Src) 97.6 F (36.4 C) (Oral)  Resp 18  SpO2 100% Physical Exam  Constitutional: He is oriented to person, place, and time. He appears well-developed and well-nourished. No distress.  HENT:  Head: Normocephalic.  Mouth/Throat: Oropharynx is clear and moist. No oropharyngeal exudate.  Abrasion and hematoma to right forehead and temple.  Eyes: Conjunctivae and EOM are normal. Pupils are equal, round, and reactive to light.  Neck: Normal range of motion. Neck supple.  No C spine tenderness  Cardiovascular: Normal rate, regular rhythm and normal heart sounds.   Pulmonary/Chest: Effort normal and breath sounds normal. No respiratory distress. He exhibits no tenderness.  Abdominal: Soft. There is no tenderness. There is no rebound and no guarding.  Musculoskeletal: Normal range of motion. He exhibits no edema or tenderness.  No T or  L spine tenderness  Neurological: He is alert and oriented to person, place, and time. No cranial nerve deficit. He exhibits normal muscle tone. Coordination normal.  5/5 strength throughout. Cranial nerves II-12 intact. No ataxia on finger to nose. Oriented to person and place. Disoriented to time.  Skin: Skin is warm.    ED Course  Procedures (including critical care time) Labs Review Labs Reviewed  COMPREHENSIVE METABOLIC PANEL - Abnormal; Notable for the following:    Glucose, Bld 105 (*)    All other components within normal limits  URINE RAPID DRUG SCREEN, HOSP PERFORMED  URINALYSIS, ROUTINE W REFLEX MICROSCOPIC (NOT AT [facility])  CBC WITH DIFFERENTIAL/PLATELET    Imaging Review Ct Head Wo Contrast  06/30/2015  CLINICAL DATA:  19 year old male with seizure today with fall and head  injury. EXAM: CT HEAD WITHOUT CONTRAST TECHNIQUE: Contiguous axial images were obtained from the base of the skull through the vertex without intravenous contrast. COMPARISON:  07/17/2011 and CT FINDINGS: Generalized cerebral atrophy noted. No acute intracranial abnormalities are identified, including mass lesion or mass effect, hydrocephalus, extra-axial fluid collection, midline shift, hemorrhage, or acute infarction. The visualized bony calvarium is unremarkable. IMPRESSION: No evidence of acute intracranial abnormality. Atrophy. Electronically Signed   By: Harmon PierJeffrey  Hu M.D.   On: 06/30/2015 17:08   I have personally reviewed and evaluated these images and lab results as part of my medical decision-making.   EKG Interpretation   Date/Time:  Sunday June 30 2015 15:49:40 EDT Ventricular Rate:  99 PR Interval:  116 QRS Duration: 98 QT Interval:  336 QTC Calculation: 431 R Axis:   86 Text Interpretation:  Sinus rhythm Biatrial enlargement Left ventricular  hypertrophy Rate faster Confirmed by Manus GunningANCOUR  MD, Akila Batta [phone]([id number]) on  06/30/2015 3:59:34 PM      MDM   Final diagnoses:  Seizure (HCC)   Witnessed seizure with history of seizure. Noncompliance with Lamictal.  Patient with mild confusion after apparent seizure. Abrasion and hematoma to right side of his head. Neurologically intact. Admits to noncompliance with Lamictal.  CT head is negative.  Patient is back to his baseline per his family members. He is tolerating by mouth and ambulatory. Discussed with case manager to get his medications as he cannot afford them.  Follow-up with neurology. Return precautions discussed. Stressed compliance with medications.  Prescription given. Stressed compliance with antiepileptic medications.  Glynn OctaveStephen Cylus Douville, MD 06/30/15 2133

## 2015-06-30 NOTE — ED Notes (Signed)
Bed: [id number] Expected date: 06/30/15 Expected time: 3:42 PM Means of arrival: Ambulance Comments: seizure

## 2015-06-30 NOTE — ED Notes (Signed)
Per EMS pt had 5 minute witnessed seizure today, fell, struck anterior right upper head. Pt usually takes lamictal but has been out for a few days because he lost his Medicaid upon turning 19. Pt states he has not had a seizure in a long time.

## 2015-06-30 NOTE — ED Notes (Signed)
MD at bedside. 

## 2015-06-30 NOTE — Discharge Instructions (Signed)
Seizure, Adult Take your seizure medication as prescribed. Follow-up with Dr. Sharene SkeansHickling. Return to the ED if you develop new or worsening symptoms A seizure is abnormal electrical activity in the brain. Seizures usually last from 30 seconds to 2 minutes. There are various types of seizures. Before a seizure, you may have a warning sensation (aura) that a seizure is about to occur. An aura may include the following symptoms:   Fear or anxiety.  Nausea.  Feeling like the room is spinning (vertigo).  Vision changes, such as seeing flashing lights or spots. Common symptoms during a seizure include:  A change in attention or behavior (altered mental status).  Convulsions with rhythmic jerking movements.  Drooling.  Rapid eye movements.  Grunting.  Loss of bladder and bowel control.  Bitter taste in the mouth.  Tongue biting. After a seizure, you may feel confused and sleepy. You may also have an injury resulting from convulsions during the seizure. HOME CARE INSTRUCTIONS   If you are given medicines, take them exactly as prescribed by your health care provider.  Keep all follow-up appointments as directed by your health care provider.  Do not swim or drive or engage in risky activity during which a seizure could cause further injury to you or others until your health care provider says it is OK.  Get adequate rest.  Teach friends and family what to do if you have a seizure. They should:  Lay you on the ground to prevent a fall.  Put a cushion under your head.  Loosen any tight clothing around your neck.  Turn you on your side. If vomiting occurs, this helps keep your airway clear.  Stay with you until you recover.  Know whether or not you need emergency care. SEEK IMMEDIATE MEDICAL CARE IF:  The seizure lasts longer than 5 minutes.  The seizure is severe or you do not wake up immediately after the seizure.  You have an altered mental status after the  seizure.  You are having more frequent or worsening seizures. Someone should drive you to the emergency department or call local emergency services (911 in [place] MAKE SURE YOU:  Understand these instructions.  Will watch your condition.  Will get help right away if you are not doing well or get worse.   This information is not intended to replace advice given to you by your health care provider. Make sure you discuss any questions you have with your health care provider.   Document Released: 08/21/2000 Document Revised: 09/14/2014 Document Reviewed: 04/05/2013 Elsevier Interactive Patient Education [employer]2016 Elsevier Inc.

## 2015-08-27 ENCOUNTER — Telehealth: Payer: Self-pay | Admitting: Family

## 2015-08-27 DIAGNOSIS — G40309 Generalized idiopathic epilepsy and epileptic syndromes, not intractable, without status epilepticus: Secondary | ICD-10-CM

## 2015-08-27 MED ORDER — LAMICTAL XR 250 MG PO TB24: ORAL_TABLET | ORAL | Status: DC

## 2015-08-27 NOTE — Telephone Encounter (Signed)
Franklin [name] left message saying that Franklin HaileyDustin neds 10 day supply of Lamictal XR 250mg  [facility] as he is waiting on mail order supply from [employer] patient assistant program. I called Franklin and let him know that I sent in the Rx as requested. TG

## 2015-09-05 ENCOUNTER — Encounter (HOSPITAL_COMMUNITY): Payer: Self-pay | Admitting: Emergency Medicine

## 2015-09-05 ENCOUNTER — Emergency Department (HOSPITAL_COMMUNITY)
Admission: EM | Admit: 2015-09-05 | Discharge: 2015-09-05 | Disposition: A | Discharge status: Home / Self Care | Payer: Self-pay | Attending: Emergency Medicine | Admitting: Emergency Medicine

## 2015-09-05 DIAGNOSIS — G40909 Epilepsy, unspecified, not intractable, without status epilepticus: Secondary | ICD-10-CM | POA: Insufficient documentation

## 2015-09-05 DIAGNOSIS — Z79899 Other long term (current) drug therapy: Secondary | ICD-10-CM | POA: Insufficient documentation

## 2015-09-05 DIAGNOSIS — Z8669 Personal history of other diseases of the nervous system and sense organs: Secondary | ICD-10-CM | POA: Insufficient documentation

## 2015-09-05 LAB — I-STAT CHEM 8, ED
BUN: 12 mg/dL (ref 6–20)
CALCIUM ION: 1.12 mmol/L (ref 1.12–1.23)
CHLORIDE: 100 mmol/L — AB (ref 101–111)
Creatinine, Ser: 0.8 mg/dL (ref 0.61–1.24)
GLUCOSE: 97 mg/dL (ref 65–99)
HCT: 48 % (ref 39.0–52.0)
Hemoglobin: 16.3 g/dL (ref 13.0–17.0)
Potassium: 3.6 mmol/L (ref 3.5–5.1)
SODIUM: 140 mmol/L (ref 135–145)
TCO2: 28 mmol/L (ref 0–100)

## 2015-09-05 LAB — CBC WITH DIFFERENTIAL/PLATELET
Basophils Absolute: 0 10*3/uL (ref 0.0–0.1)
Basophils Relative: 0 %
EOS ABS: 0.1 10*3/uL (ref 0.0–0.7)
EOS PCT: 2 %
HCT: 45.5 % (ref 39.0–52.0)
Hemoglobin: 15.6 g/dL (ref 13.0–17.0)
LYMPHS ABS: 1.9 10*3/uL (ref 0.7–4.0)
LYMPHS PCT: 30 %
MCH: 31.6 pg (ref 26.0–34.0)
MCHC: 34.3 g/dL (ref 30.0–36.0)
MCV: 92.3 fL (ref 78.0–100.0)
MONOS PCT: 10 %
Monocytes Absolute: 0.7 10*3/uL (ref 0.1–1.0)
Neutro Abs: 3.6 10*3/uL (ref 1.7–7.7)
Neutrophils Relative %: 58 %
PLATELETS: 244 10*3/uL (ref 150–400)
RBC: 4.93 MIL/uL (ref 4.22–5.81)
RDW: 12.8 % (ref 11.5–15.5)
WBC: 6.3 10*3/uL (ref 4.0–10.5)

## 2015-09-05 LAB — CBG MONITORING, ED: Glucose-Capillary: 91 mg/dL (ref 65–99)

## 2015-09-05 MED ORDER — LAMOTRIGINE ER 250 MG PO TB24: 1.0000 | ORAL_TABLET | Freq: Every day | ORAL | Status: DC

## 2015-09-05 MED ORDER — SODIUM CHLORIDE 0.9 % IV SOLN
1000.0000 mg | Freq: Once | INTRAVENOUS | Status: AC
  Administered 2015-09-05: 1000 mg via INTRAVENOUS
  Filled 2015-09-05: qty 10

## 2015-09-05 NOTE — ED Notes (Signed)
Bed: [id number] Expected date:  Expected time:  Means of arrival:  Comments: EMS rm10 seizure 52M

## 2015-09-05 NOTE — ED Provider Notes (Signed)
CSN: [id number]     Arrival date & time 09/05/15  0128 History  By signing my name below, I, Emmanuella Mensah, attest that this documentation has been prepared under the direction and in the presence of No att. providers found. Electronically Signed: Angelene Giovanni, ED Scribe. 09/05/2015. 1:40 AM.    Chief Complaint  Patient presents with  . Seizures   Patient is a 19 y.o. male presenting with seizures. The history is provided by the patient. The history is limited by the condition of the patient. No language interpreter was used.  Seizures Seizure activity on arrival: no   Seizure type:  Unable to specify Preceding symptoms: no sensation of an aura present   Initial focality:  Unable to specify Episode characteristics: generalized shaking   Postictal symptoms: confusion   Severity:  Moderate Duration:  3 minutes Timing:  Once Number of seizures this episode:  1 Context: not alcohol withdrawal and not fever   Recent head injury:  No recent head injuries PTA treatment:  None History of seizures: yes    HPI Comments: Level 5 Caveat due to confusion post seizure.  Franklin [name] is a 19 y.o. male with a hx of seizures who presents to the Emergency Department complaining of a witnessed seizure that lasted for several minutes that occurred PTA. He reports that his last dose of his 250 mg of Lamictal XR was several days ago and he is currently waiting on an emergency dose of his medications.    Past Medical History  Diagnosis Date  . Seizures (HCC)   . Prematurity   . Retinopathy of prematurity    Past Surgical History  Procedure Laterality Date  . Central line placement      As premature infant  . Refractive surgery      For retinopathy of prematurity   Family History  Problem Relation Age of Onset  . Autism Brother     Also has cognitive delays  . Diabetes type II Father   . Hypercholesterolemia Father   . Hypertension Father   . Stroke Paternal Grandmother   . Stroke  Paternal Aunt    Social History  Substance Use Topics  . Smoking status: Never Smoker   . Smokeless tobacco: Never Used  . Alcohol Use: No    Review of Systems  Unable to perform ROS: Other  All other systems reviewed and are negative.     Allergies  Concerta  Home Medications   Prior to Admission medications   Medication Sig Start Date End Date Taking? Authorizing Provider  carbamide peroxide (DEBROX) 6.5 % otic solution Place 5 drops into both ears 2 (two) times daily. X 5 days Patient not taking: Reported on 04/06/2015 02/21/15   Mercedes Camprubi-Soms, PA-C  LAMICTAL XR 250 MG TB24 Take 1 tablet in the morning and take 2 tablets at night 08/27/15   Elveria Rising, NP   BP 101/64 mmHg  Pulse 81  Temp(Src) 97.7 F (36.5 C) (Oral)  Resp 20  SpO2 99% Physical Exam  Constitutional: He is oriented to person, place, and time. He appears well-developed and well-nourished. No distress.  HENT:  Head: Normocephalic and atraumatic.  Mouth/Throat: Oropharynx is clear and moist.  Eyes: Conjunctivae and EOM are normal. Pupils are equal, round, and reactive to light.  Neck: Normal range of motion. Neck supple. No tracheal deviation present.  Cardiovascular: Normal rate and regular rhythm.   Pulmonary/Chest: Effort normal and breath sounds normal. No respiratory distress. He has no wheezes.  He has no rales.  Abdominal: Soft. Bowel sounds are normal. There is no tenderness. There is no rebound and no guarding.  Musculoskeletal: Normal range of motion.  Neurological: He is alert and oriented to person, place, and time. He has normal reflexes.  Skin: Skin is warm and dry.  Psychiatric: He has a normal mood and affect. His behavior is normal.  Nursing note and vitals reviewed.   ED Course  Procedures (including critical care time) DIAGNOSTIC STUDIES: Oxygen Saturation is 99% on RA, normal by my interpretation.    COORDINATION OF CARE: 1:38 AM- Pt advised of plan for treatment  and pt agrees. Pt will receive IV fluids and medication.    Labs Review Labs Reviewed  CBG MONITORING, ED    Imaging Review No results found.   No att. providers found has personally reviewed and evaluated these images and lab results as part of her medical decision-making.   EKG Interpretation None      MDM   Final diagnoses:  None     EKG Interpretation  Date/Time:  Thursday September 05 2015 01:30:15 EST Ventricular Rate:  80 PR Interval:  126 QRS Duration: 80 QT Interval:  375 QTC Calculation: 433 R Axis:   79 Text Interpretation:  Sinus rhythm Confirmed by [facility]  MD, Jameya Pontiff ([id number]) on 09/05/2015 1:43:11 AM      Medications  levETIRAcetam (KEPPRA) 1,000 mg in sodium chloride 0.9 % 100 mL IVPB (not administered)   Will discharge with a script for lamictal.  No driving until cleared by neurology must follow up with your neurologist for medication adjustment   I personally performed the services described in this documentation, which was scribed in my presence. The recorded information has been reviewed and is accurate.       Cy BlamerApril Sayre Witherington, MD 09/05/15 [phone]

## 2015-09-05 NOTE — ED Notes (Signed)
Patient presents from home via EMS for seizure. Takes lamictal for same and has been out of same x4 days. No incontinence, no oral trauma. Per family, seizure lasted about 5 minute. A&O x4. Ambulatory to bed in room.  16g left AC.   Last VS: 108/70, 98hr, 18resp, 98%ra.

## 2015-09-05 NOTE — Discharge Instructions (Signed)
Epilepsy °People with epilepsy have times when they shake and jerk uncontrollably (seizures). This happens when there is a sudden change in brain function. Epilepsy may have many possible causes. Anything that disturbs the normal pattern of brain cell activity can lead to seizures. °HOME CARE  °· Follow your doctor's instructions about driving and safety during normal activities. °· Get enough sleep. °· Only take medicine as told by your doctor. °· Avoid things that you know can cause you to have seizures (triggers). °· Write down when your seizures happen and what you remember about each seizure. Write down anything you think may have caused the seizure to happen. °· Tell the people you live and work with that you have seizures. Make sure they know how to help you. They should: °¨ Cushion your head and body. °¨ Turn you on your side. °¨ Not restrain you. °¨ Not place anything inside your mouth. °¨ Call for local emergency medical help if there is any question about what has happened. °· Keep all follow-up visits with your doctor. This is very important. °GET HELP IF: °· You get an infection or start to feel sick. You may have more seizures when you are sick. °· You are having seizures more often. °· Your seizure pattern is changing. °GET HELP RIGHT AWAY IF:  °· A seizure does not stop after a few seconds or minutes. °· A seizure causes you to have trouble breathing. °· A seizure gives you a very bad headache. °· A seizure makes you unable to speak or use a part of your body. °  °This information is not intended to replace advice given to you by your health care provider. Make sure you discuss any questions you have with your health care provider. °  °Document Released: 06/21/2009 Document Revised: 06/14/2013 Document Reviewed: 04/05/2013 °Elsevier Interactive Patient Education ©2016 Elsevier Inc. ° °

## 2015-09-19 ENCOUNTER — Encounter: Payer: Self-pay | Admitting: Family

## 2015-12-20 ENCOUNTER — Encounter (HOSPITAL_COMMUNITY): Payer: Self-pay | Admitting: *Deleted

## 2015-12-20 ENCOUNTER — Emergency Department (HOSPITAL_COMMUNITY)
Admission: EM | Admit: 2015-12-20 | Discharge: 2015-12-20 | Disposition: A | Discharge status: Home / Self Care | Payer: Medicaid Other | Attending: Emergency Medicine | Admitting: Emergency Medicine

## 2015-12-20 DIAGNOSIS — Y9389 Activity, other specified: Secondary | ICD-10-CM | POA: Insufficient documentation

## 2015-12-20 DIAGNOSIS — Y9289 Other specified places as the place of occurrence of the external cause: Secondary | ICD-10-CM | POA: Insufficient documentation

## 2015-12-20 DIAGNOSIS — Y998 Other external cause status: Secondary | ICD-10-CM | POA: Insufficient documentation

## 2015-12-20 DIAGNOSIS — Z8669 Personal history of other diseases of the nervous system and sense organs: Secondary | ICD-10-CM | POA: Insufficient documentation

## 2015-12-20 DIAGNOSIS — W1839XA Other fall on same level, initial encounter: Secondary | ICD-10-CM | POA: Insufficient documentation

## 2015-12-20 DIAGNOSIS — Z79899 Other long term (current) drug therapy: Secondary | ICD-10-CM | POA: Insufficient documentation

## 2015-12-20 DIAGNOSIS — S20311A Abrasion of right front wall of thorax, initial encounter: Secondary | ICD-10-CM

## 2015-12-20 MED ORDER — BACITRACIN ZINC 500 UNIT/GM EX OINT: 1.0000 "application " | TOPICAL_OINTMENT | Freq: Two times a day (BID) | CUTANEOUS | Status: DC

## 2015-12-20 MED ORDER — IBUPROFEN 600 MG PO TABS: 600.0000 mg | ORAL_TABLET | Freq: Four times a day (QID) | ORAL | Status: DC | PRN

## 2015-12-20 NOTE — Discharge Instructions (Signed)
Abrasion An abrasion is a cut or scrape on the outer surface of your skin. An abrasion does not extend through all of the layers of your skin. It is important to care for your abrasion properly to prevent infection. CAUSES Most abrasions are caused by falling on or gliding across the ground or another surface. When your skin rubs on something, the outer and inner layer of skin rubs off.  SYMPTOMS A cut or scrape is the main symptom of this condition. The scrape may be bleeding, or it may appear red or pink. If there was an associated fall, there may be an underlying bruise. DIAGNOSIS An abrasion is diagnosed with a physical exam. TREATMENT Treatment for this condition depends on how large and deep the abrasion is. Usually, your abrasion will be cleaned with water and mild soap. This removes any dirt or debris that may be stuck. An antibiotic ointment may be applied to the abrasion to help prevent infection. A bandage (dressing) may be placed on the abrasion to keep it clean. You may also need a tetanus shot. HOME CARE INSTRUCTIONS Medicines  Take or apply medicines only as directed by your health care provider.  If you were prescribed an antibiotic ointment, finish all of it even if you start to feel better. Wound Care  Clean the wound with mild soap and water 2-3 times per day or as directed by your health care provider. Pat your wound dry with a clean towel. Do not rub it.  There are many different ways to close and cover a wound. Follow instructions from your health care provider about:  Wound care.  Dressing changes and removal.  Check your wound every day for signs of infection. Watch for:  Redness, swelling, or pain.  Fluid, blood, or pus. General Instructions  Keep the dressing dry as directed by your health care provider. Do not take baths, swim, use a hot tub, or do anything that would put your wound underwater until your health care provider approves.  If there is  swelling, raise (elevate) the injured area above the level of your heart while you are sitting or lying down.  Keep all follow-up visits as directed by your health care provider. This is important. SEEK MEDICAL CARE IF:  You received a tetanus shot and you have swelling, severe pain, redness, or bleeding at the injection site.  Your pain is not controlled with medicine.  You have increased redness, swelling, or pain at the site of your wound. SEEK IMMEDIATE MEDICAL CARE IF:  You have a red streak going away from your wound.  You have a fever.  You have fluid, blood, or pus coming from your wound.  You notice a bad smell coming from your wound or your dressing.   This information is not intended to replace advice given to you by your health care provider. Make sure you discuss any questions you have with your health care provider.   Document Released: 06/03/2005 Document Revised: 05/15/2015 Document Reviewed: 08/22/2014 Elsevier Interactive Patient Education 2016 Elsevier Inc.  

## 2015-12-20 NOTE — ED Notes (Signed)
Pt was helping his mother move furniture tonight; pt was moving a printer and it rolled and he fell forward with it; pt c/o abrasion to lower mid chest area; tender to palpation; no deformity noted; pt states that the pain is worse with movement

## 2015-12-20 NOTE — ED Provider Notes (Signed)
CSN: [id number]     Arrival date & time 12/20/15  0208 History   First MD Initiated Contact with Patient 12/20/15 0230     Chief Complaint  Patient presents with  . Abrasion     (Consider location/radiation/quality/duration/timing/severity/associated sxs/prior Treatment) HPI Comments: 20 year old male with no significant past medical history presents to the emergency department for evaluation of an abrasion to his chest wall. Patient states that he was lifting furniture with his mother when he fell forward with the furniture causing an abrasion to his chest. Patient states that he has had constant aching pain at the site of his abrasion. Pain has been improving spontaneously since onset. Injury occurred at 2300 this evening. No medications given prior to arrival. Patient has had no associated nausea, vomiting, abdominal pain, or significant pain with deep breathing. Immunizations up-to-date.  The history is provided by the patient. No language interpreter was used.    Past Medical History  Diagnosis Date  . Seizures (HCC)   . Prematurity   . Retinopathy of prematurity    Past Surgical History  Procedure Laterality Date  . Central line placement      As premature infant  . Refractive surgery      For retinopathy of prematurity   Family History  Problem Relation Age of Onset  . Autism Brother     Also has cognitive delays  . Diabetes type II Father   . Hypercholesterolemia Father   . Hypertension Father   . Stroke Paternal Grandmother   . Stroke Paternal Aunt    Social History  Substance Use Topics  . Smoking status: Never Smoker   . Smokeless tobacco: Never Used  . Alcohol Use: No    Review of Systems  Cardiovascular: Positive for chest pain (chest wall).  Skin: Positive for wound.  All other systems reviewed and are negative.   Allergies  Concerta  Home Medications   Prior to Admission medications   Medication Sig Start Date End Date Taking? Authorizing  Provider  bacitracin ointment Apply 1 application topically 2 (two) times daily. 12/20/15   Antony Madura, PA-C  carbamide peroxide (DEBROX) 6.5 % otic solution Place 5 drops into both ears 2 (two) times daily. X 5 days Patient not taking: Reported on 04/06/2015 02/21/15   Mercedes Camprubi-Soms, PA-C  ibuprofen (ADVIL,MOTRIN) 600 MG tablet Take 1 tablet (600 mg total) by mouth every 6 (six) hours as needed. 12/20/15   Antony Madura, PA-C  LAMICTAL XR 250 MG TB24 Take 1 tablet in the morning and take 2 tablets at night 08/27/15   Elveria Rising, NP  LamoTRIgine (LAMICTAL XR) 250 MG TB24 Take 1 tablet by mouth daily. 09/05/15   April Palumbo, MD   BP 128/79 mmHg  Pulse 65  Temp(Src) 98.1 F (36.7 C) (Oral)  Resp 16  SpO2 100%   Physical Exam  Constitutional: He is oriented to person, place, and time. He appears well-developed and well-nourished. No distress.  Nontoxic-appearing  HENT:  Head: Normocephalic and atraumatic.  Eyes: Conjunctivae and EOM are normal. No scleral icterus.  Neck: Normal range of motion.  Cardiovascular: Normal rate, regular rhythm and intact distal pulses.   Pulmonary/Chest: Effort normal and breath sounds normal. No respiratory distress. He has no wheezes. He has no rales. He exhibits tenderness and swelling (mild associated with abrasion). He exhibits no mass, no laceration, no crepitus and no deformity.    Lungs clear to auscultation bilaterally. There is an abrasion noted overlying the right lower chest wall  with associated tenderness. There is no crepitus or deformity. Chest expansion symmetric.  Musculoskeletal: Normal range of motion.  Neurological: He is alert and oriented to person, place, and time. He exhibits normal muscle tone. Coordination normal.  Ambulatory with steady gait  Skin: Skin is warm and dry. No rash noted. He is not diaphoretic. No erythema. No pallor.  Psychiatric: He has a normal mood and affect. His behavior is normal.  Nursing note and  vitals reviewed.   ED Course  Procedures (including critical care time) Labs Review Labs Reviewed - No data to display  Imaging Review No results found. I have personally reviewed and evaluated these images and lab results as part of my medical decision-making.   EKG Interpretation None      MDM   Final diagnoses:  Abrasion of chest wall, right, initial encounter    Patient presents for an abrasion to his chest wall. No evidence of secondary infection or cellulitis. No bony deformity or crepitus appreciated to the chest wall. Lungs are clear bilaterally. Chest expansion is symmetric. Low suspicion for traumatic emergent thoracic injury. Will treat supportively with NSAIDs and topical bacitracin. Return precautions given at discharge. Patient and father agreeable to plan with no unaddressed concerns; discharged in good condition.   Filed Vitals:   12/20/15 0213  BP: 128/79  Pulse: 65  Temp: 98.1 F (36.7 C)  TempSrc: Oral  Resp: 16  SpO2: 100%     Antony MaduraKelly Eastyn Skalla, PA-C 12/20/15 0614  Antony MaduraKelly Jisselle Poth, PA-C 12/20/15 [id number]  Dione Boozeavid Glick, MD 12/20/15 [phone]

## 2016-01-06 ENCOUNTER — Telehealth: Payer: Self-pay

## 2016-01-06 DIAGNOSIS — G40309 Generalized idiopathic epilepsy and epileptic syndromes, not intractable, without status epilepticus: Secondary | ICD-10-CM

## 2016-01-06 MED ORDER — LAMICTAL XR 250 MG PO TB24: ORAL_TABLET | ORAL | Status: DC

## 2016-01-06 NOTE — Telephone Encounter (Signed)
Lorella NimrodHarvey, dad, lvm stating that [employer], [facility] known [facility] [employer], is requesting Rx for 90 day supply of Humberto's medication to be faxed to them. The fax number that Lorella NimrodHarvey has is : [phone]. Dad's CB# is [phone].

## 2016-01-06 NOTE — Telephone Encounter (Signed)
Rx faxed as requested to [employer] patient assistance program

## 2016-01-09 ENCOUNTER — Other Ambulatory Visit: Payer: Self-pay | Admitting: Family

## 2016-01-09 DIAGNOSIS — G40309 Generalized idiopathic epilepsy and epileptic syndromes, not intractable, without status epilepticus: Secondary | ICD-10-CM

## 2016-01-09 MED ORDER — LAMICTAL XR 300 MG PO TB24: ORAL_TABLET | ORAL | Status: DC

## 2016-01-09 MED ORDER — LAMICTAL XR 200 MG PO TB24: ORAL_TABLET | ORAL | Status: DC

## 2016-01-09 MED ORDER — LAMICTAL XR 250 MG PO TB24: ORAL_TABLET | ORAL | Status: DC

## 2016-01-09 NOTE — Telephone Encounter (Signed)
[employer] patient assistance will provide #180 of any prescription. With the Lamictal XR 250mg  dose, he needed #270 for a 90 day supply. [employer] agreed to cover individual Rx's for him to take Lamictal XR 250mg  in the morning and Lamictal XR 200mg  and 300mg  at night (to total 500mg  at night). I faxed in the Rx's as requested. I called Dad to let him know. TG

## 2016-04-14 ENCOUNTER — Other Ambulatory Visit: Payer: Self-pay | Admitting: Family

## 2016-04-14 DIAGNOSIS — G40309 Generalized idiopathic epilepsy and epileptic syndromes, not intractable, without status epilepticus: Secondary | ICD-10-CM

## 2016-04-14 MED ORDER — LAMICTAL XR 200 MG PO TB24: ORAL_TABLET | ORAL | 3 refills | Status: DC

## 2016-04-14 MED ORDER — LAMICTAL XR 300 MG PO TB24: ORAL_TABLET | ORAL | 3 refills | Status: DC

## 2016-04-14 MED ORDER — LAMICTAL XR 250 MG PO TB24: ORAL_TABLET | ORAL | 3 refills | Status: DC

## 2016-04-20 ENCOUNTER — Telehealth: Payer: Self-pay

## 2016-04-20 DIAGNOSIS — G40309 Generalized idiopathic epilepsy and epileptic syndromes, not intractable, without status epilepticus: Secondary | ICD-10-CM

## 2016-04-20 MED ORDER — LAMICTAL XR 300 MG PO TB24: ORAL_TABLET | ORAL | 3 refills | Status: DC

## 2016-04-20 MED ORDER — LAMICTAL XR 200 MG PO TB24: ORAL_TABLET | ORAL | 3 refills | Status: DC

## 2016-04-20 MED ORDER — LAMICTAL XR 250 MG PO TB24: ORAL_TABLET | ORAL | 3 refills | Status: DC

## 2016-04-20 NOTE — Telephone Encounter (Signed)
Dad lvm stating that the PAP told him it would be around 7-10 business days before child will receive his medication. They suggested dad call our office and ask for a 10 day supply to be called into the pharmacy. Dad would like the 10 day supply called into [facility]. CB# [phone].

## 2016-04-20 NOTE — Telephone Encounter (Signed)
Please let Dad know that I have faxed the Rx to the pharmacy for the 10 day supply. Tell him that the pharmacy may have to order the medication and that he should contact the pharmacy to see if they have it or have to order it. Thanks,  [name]

## 2016-04-20 NOTE — Telephone Encounter (Signed)
I was ua to lvm for dad bc his vmb was full.

## 2016-04-21 NOTE — Telephone Encounter (Signed)
Tried calling dad again this morning and his vmb was full.

## 2016-04-21 NOTE — Telephone Encounter (Signed)
I called dad and told him that the Rx's were sent to the pharmacy. I told him that he should call the pharmacy to make sure it is in stock, if not, he may need to ask them to order it. He expressed understanding.

## 2016-08-24 ENCOUNTER — Ambulatory Visit (INDEPENDENT_AMBULATORY_CARE_PROVIDER_SITE_OTHER): Payer: Medicaid Other | Admitting: Family

## 2016-08-24 ENCOUNTER — Ambulatory Visit (INDEPENDENT_AMBULATORY_CARE_PROVIDER_SITE_OTHER): Payer: Self-pay | Admitting: Family

## 2016-08-25 ENCOUNTER — Ambulatory Visit (INDEPENDENT_AMBULATORY_CARE_PROVIDER_SITE_OTHER): Payer: Self-pay | Admitting: Family

## 2016-08-25 ENCOUNTER — Encounter (INDEPENDENT_AMBULATORY_CARE_PROVIDER_SITE_OTHER): Payer: Self-pay | Admitting: Family

## 2016-08-25 VITALS — BP 106/70 | HR 84 | Ht 69.0 in | Wt 122.4 lb

## 2016-08-25 DIAGNOSIS — G40309 Generalized idiopathic epilepsy and epileptic syndromes, not intractable, without status epilepticus: Secondary | ICD-10-CM

## 2016-08-25 MED ORDER — LAMICTAL XR 100 MG PO TB24: ORAL_TABLET | ORAL | 3 refills | Status: DC

## 2016-08-25 NOTE — Patient Instructions (Signed)
I will send a prescription to the [employer] [facility] for Lamictal XR 100mg . When you receive the medication, take 1 tablet in the morning in addition to the medication that you take now.   Let me know if you have any more seizures.   Please plan to return for follow up in 1 year or sooner if needed.

## 2016-08-25 NOTE — Progress Notes (Signed)
Patient: Franklin [name] MRN: [id number] Sex: male DOB: 12/03/1995  Provider: Elveria Risingina Tishawn Friedhoff, NP [facility]  Note type: Routine return visit  History of Present Illness: Referral Source: [facility] History from: patient, [facility] chart and parent Chief Complaint: Generalized Convulsive Epilepsy   Franklin [name] is a 20 y.o. young man with history of generalized convulsive seizure disorder. He was last seen December 24, 2014. Franklin [name] has history of prematurity, intellectual disability and a generalized convulsive seizure disorder. He was taking Lamotrigine since 2008 but was switched to brand Lamictal XR in 2015 when he lost insurance coverage and started getting his medication through a patient assistance program He tends to have seizures when he has been sleep deprived. [name] typically needs at least 9 hours of sleep, and on days when he gets 6 or fewer hours of sleep, he tends to have breakthrough seizures. Dad reports today that Franklin [name] had a seizure last week that was not in the setting in of sleep deprivation. On that day he was standing at the sink washing dishes. His aunt walked in the room and found him standing staring up at the ceiling, and caught him just as he began to jerk and fall to the floor. In reviewing his medication, he had missed one dose the day prior to the seizure. He had been seizure free for about 8 months prior to that event.   Franklin [name] is not employed, and spends his days playing video games and [place] 1[place]5 miles away. [place] [employer] [name] says that he looks up things on the computer, reads and writes down his thoughts. There are no concerns with behavior. Franklin [name] has been generally healthy since last seen and Dad has no specific concerns about his health other than his seizure history.  Review of Systems: Please see the HPI for neurologic and other pertinent  review of systems. Otherwise, the following systems are noncontributory including constitutional, eyes, ears, nose and throat, cardiovascular, respiratory, gastrointestinal, genitourinary, musculoskeletal, skin, endocrine, hematologic/lymph, allergic/immunologic and psychiatric.   Past Medical History:  Diagnosis Date  . Prematurity   . Retinopathy of prematurity   . Seizures (HCC)    Hospitalizations: No., Head Injury: No., Nervous System Infections: No., Immunizations up to date: Yes.   Past Medical History Comments: See history  Surgical History Past Surgical History:  Procedure Laterality Date  . Central line placement     As premature infant  . REFRACTIVE SURGERY     For retinopathy of prematurity    Family History family history includes Autism in his brother; Diabetes type II in his father; Hypercholesterolemia in his father; Hypertension in his father; Stroke in his paternal aunt and paternal grandmother. Family History is otherwise negative for migraines, seizures, cognitive impairment, blindness, deafness, birth defects, chromosomal disorder, autism.  Social History Social History   Social History  . Marital status: Single    Spouse name: N/A  . Number of children: N/A  . Years of education: N/A   Social History Main Topics  . Smoking status: Never Smoker  . Smokeless tobacco: Never Used  . Alcohol use No  . Drug use: No  . Sexual activity: No   Other Topics Concern  . Not on file   Social History Narrative   Parents are divorced. Franklin [name] lives with his father.    Allergies Allergies  Allergen Reactions  . Concerta [Methylphenidate] Other (See  Comments)    Would start crying for little things like when someone coughed or said a specific word for example.     Physical Exam BP 106/70   Pulse 84   Ht 5\' 9"  (1.753 m)   Wt 122 lb 6.4 oz (55.5 kg)   BMI 18.08 kg/m  General: well developed, well nourished young man, seated on exam table, in no evident  distress; right handed  Head: head normocephalic and atraumatic.  Ears, Nose and Throat: Oropharynx benign.  Neck: supple with no carotid or supraclavicular bruits.  Respiratory: lungs clear to auscultation  Cardiovascular: regular rate and rhythm, no murmurs  Musculoskeletal: no obvious deformities or lesions  Skin: Mild facial acne, small reddish lesion on right parietal area of scalp   Neurologic Exam  Mental Status: Awake and fully alert. Oriented to place and time. Recent and remote memory intact. Attention span and concentration appropriate. Fund of knowledge less than expected for age. Mood and affect appropriate.  Cranial Nerves: Fundoscopic exam reveals sharp disc margins. Pupils equal, briskly reactive to light. Extraocular movements full without nystagmus. Visual fields full to confrontation. Hearing intact and symmetric to finger rub. Facial sensation intact. Face, tongue, palate move normally and symmetrically. Neck flexion and extension normal. He has mild dysarthria.  Motor: Normal bulk and tone. Normal strength in all tested extremity muscles.  Sensory: Intact to touch and temperature in all extremities.  Coordination: Rapid alternating movements normal in all extremities. Finger-to-nose and heel-to-shin performed accurately bilaterally. Romberg negative.  Gait and Station: Arises from chair without difficulty. Stance is normal. Gait demonstrates normal stride length and balance. Able to heel, toe, and tandem walk without difficulty.  Reflexes: Diminished and symmetric. Toes downgoing.   Impression 1. Generalized convulsive seizure disorder, G40.309 2. Intellectual delay, F70 3. History of prematurity, Z87.898  Recommendations for plan of care [facility]. Franklin [name] has neither had nor required imaging or lab studies since the last visit. Franklin [name] is a 20 year old young man with history of generalized convulsive seizures, history  of prematurity and intellectual delay. He is taking and tolerating Lamictal XR. He had a seizure last week and I recommended a dose increase of the medication. I called the patient assistance company and they will approve another strength of Lamictal XR but not an increase of his current medications. Therefore I sent in a prescription to the patient assistance program for Lamictal XR 100mg . I told his father that when the medication arrives, to give him one tablet in the morning in addition to what Franklin [name] is taking now. I asked Dad to let me know if Franklin [name] has more seizures. I reminded Franklin [name] and his father of the need for him to get at least 9 hours of sleep at night, and to not miss any medication doses. He will continue his other medications without change for now and will return for follow up in 1 year or sooner if needed. Dad agreed with the plans made today.  The medication list was reviewed and reconciled.  No changes were made in the prescribed medications today.  A complete medication list was provided to the patient.   Allergies as of 08/25/2016      Reactions   Concerta [methylphenidate] Other (See Comments)   Would start crying for little things like when someone coughed or said a specific word for example.       Medication List       Accurate as of 08/25/16 11:59  PM. Always use your most recent med list.          LAMICTAL XR 200 MG Tb24 Generic drug:  LamoTRIgine XR Take 1 tablet at night along with a Lamictal XR 300mg  tablet   LAMICTAL XR 250 MG Tb24 Generic drug:  LamoTRIgine Take 1 tablet in the morning   LAMICTAL XR 300 MG Tb24 Generic drug:  LamoTRIgine Take 1 tablet at night along with Lamictal XR 200mg    LAMICTAL XR 100 MG Tb24 Generic drug:  LamoTRIgine Take 1 tablet every morning       Dr. Sharene Skeans was consulted regarding the patient.   Total time spent with the patient was 25 minutes, of which 50% or more was spent in counseling and coordination of care.     Elveria Rising NP-C

## 2016-11-12 ENCOUNTER — Telehealth (INDEPENDENT_AMBULATORY_CARE_PROVIDER_SITE_OTHER): Payer: Self-pay

## 2016-11-12 DIAGNOSIS — G40309 Generalized idiopathic epilepsy and epileptic syndromes, not intractable, without status epilepticus: Secondary | ICD-10-CM

## 2016-11-12 MED ORDER — LAMICTAL XR 100 MG PO TB24: ORAL_TABLET | ORAL | 3 refills | Status: DC

## 2016-11-12 MED ORDER — LAMICTAL XR 200 MG PO TB24: ORAL_TABLET | ORAL | 3 refills | Status: DC

## 2016-11-12 MED ORDER — LAMICTAL XR 300 MG PO TB24: ORAL_TABLET | ORAL | 3 refills | Status: DC

## 2016-11-12 MED ORDER — LAMICTAL XR 250 MG PO TB24: ORAL_TABLET | ORAL | 3 refills | Status: DC

## 2016-11-12 NOTE — Telephone Encounter (Signed)
Patient's father, Lorella NimrodHarvey, called stating he needed to discuss some things with [name]. He also states that the patient is also going to run out of medication. He is requesting a call back.   CB:[phone]

## 2016-11-12 NOTE — Telephone Encounter (Signed)
I called Dad and he said that he needed new Rx's sent to [employer] patient assistance [facility]. I sent in the Rx's as requested. TG

## 2017-02-26 ENCOUNTER — Other Ambulatory Visit (INDEPENDENT_AMBULATORY_CARE_PROVIDER_SITE_OTHER): Payer: Self-pay | Admitting: Family

## 2017-02-26 DIAGNOSIS — G40309 Generalized idiopathic epilepsy and epileptic syndromes, not intractable, without status epilepticus: Secondary | ICD-10-CM

## 2017-02-26 MED ORDER — LAMICTAL XR 200 MG PO TB24: ORAL_TABLET | ORAL | 3 refills | Status: DC

## 2017-02-26 MED ORDER — LAMICTAL XR 300 MG PO TB24: ORAL_TABLET | ORAL | 3 refills | Status: DC

## 2017-02-26 MED ORDER — LAMICTAL XR 100 MG PO TB24: ORAL_TABLET | ORAL | 3 refills | Status: DC

## 2017-02-26 MED ORDER — LAMICTAL XR 250 MG PO TB24: ORAL_TABLET | ORAL | 3 refills | Status: DC

## 2017-02-26 NOTE — Telephone Encounter (Signed)
Needs prescriptions for Patient Assistance Application Lamictal XR. I printed Rx's and called Dad to let him know that they are ready for pick up. TG

## 2017-03-26 ENCOUNTER — Telehealth (INDEPENDENT_AMBULATORY_CARE_PROVIDER_SITE_OTHER): Payer: Self-pay | Admitting: Family

## 2017-03-26 DIAGNOSIS — G40309 Generalized idiopathic epilepsy and epileptic syndromes, not intractable, without status epilepticus: Secondary | ICD-10-CM

## 2017-03-26 MED ORDER — LAMICTAL XR 300 MG PO TB24: ORAL_TABLET | ORAL | 3 refills | Status: DC

## 2017-03-26 MED ORDER — LAMICTAL XR 100 MG PO TB24: ORAL_TABLET | ORAL | 3 refills | Status: DC

## 2017-03-26 MED ORDER — LAMICTAL XR 250 MG PO TB24: ORAL_TABLET | ORAL | 3 refills | Status: DC

## 2017-03-26 MED ORDER — LAMICTAL XR 200 MG PO TB24: ORAL_TABLET | ORAL | 3 refills | Status: DC

## 2017-03-26 NOTE — Telephone Encounter (Signed)
Rx has been placed on Dr. Darl HouseholderHickling's desk to be signed

## 2017-03-26 NOTE — Telephone Encounter (Signed)
  Who's calling (name and relationship to patient) : Lorella NimrodHarvey, father  Best contact number: [phone]  Provider they see: Elveria Risingina Goodpasture  Reason for call: Father called in requesting an Emergency 10 day refill on all of the Lamictal.     PRESCRIPTION REFILL ONLY  Name of prescription: Lamictal XR 100MG , 200MG , 250MG , 300MG   Pharmacy: [facility] [address](Confirmed with father)

## 2017-04-05 ENCOUNTER — Encounter (HOSPITAL_COMMUNITY): Payer: Self-pay | Admitting: Emergency Medicine

## 2017-04-05 ENCOUNTER — Emergency Department (HOSPITAL_COMMUNITY)
Admission: EM | Admit: 2017-04-05 | Discharge: 2017-04-05 | Disposition: A | Discharge status: Home / Self Care | Payer: Medicaid Other | Attending: Emergency Medicine | Admitting: Emergency Medicine

## 2017-04-05 ENCOUNTER — Emergency Department (HOSPITAL_COMMUNITY): Payer: Medicaid Other

## 2017-04-05 DIAGNOSIS — M25562 Pain in left knee: Secondary | ICD-10-CM

## 2017-04-05 DIAGNOSIS — W19XXXA Unspecified fall, initial encounter: Secondary | ICD-10-CM

## 2017-04-05 DIAGNOSIS — Y9302 Activity, running: Secondary | ICD-10-CM | POA: Insufficient documentation

## 2017-04-05 DIAGNOSIS — Z79899 Other long term (current) drug therapy: Secondary | ICD-10-CM | POA: Insufficient documentation

## 2017-04-05 DIAGNOSIS — Y999 Unspecified external cause status: Secondary | ICD-10-CM | POA: Insufficient documentation

## 2017-04-05 DIAGNOSIS — S80211A Abrasion, right knee, initial encounter: Secondary | ICD-10-CM | POA: Insufficient documentation

## 2017-04-05 DIAGNOSIS — T148XXA Other injury of unspecified body region, initial encounter: Secondary | ICD-10-CM

## 2017-04-05 DIAGNOSIS — S80212A Abrasion, left knee, initial encounter: Secondary | ICD-10-CM | POA: Insufficient documentation

## 2017-04-05 DIAGNOSIS — W010XXA Fall on same level from slipping, tripping and stumbling without subsequent striking against object, initial encounter: Secondary | ICD-10-CM | POA: Insufficient documentation

## 2017-04-05 DIAGNOSIS — Y92410 Unspecified street and highway as the place of occurrence of the external cause: Secondary | ICD-10-CM | POA: Insufficient documentation

## 2017-04-05 DIAGNOSIS — M25561 Pain in right knee: Secondary | ICD-10-CM

## 2017-04-05 DIAGNOSIS — G8911 Acute pain due to trauma: Secondary | ICD-10-CM | POA: Insufficient documentation

## 2017-04-05 NOTE — Discharge Instructions (Signed)
Please read attached information. If you experience any new or worsening signs or symptoms please return to the emergency room for evaluation. Please follow-up with your primary care provider or specialist as discussed.  Please continue keeping the wounds clean with soap and water followed by bacitracin.

## 2017-04-05 NOTE — ED Provider Notes (Signed)
[facility]-EMERGENCY DEPT Provider Note   CSN: [id number] Arrival date & time: 04/05/17  1449   By signing my name below, I, Franklin [name], attest that this documentation has been prepared under the direction and in the presence of Franklin FortsJeff Lanesha Azzaro, PA-C Electronically Signed: Soijett [name], ED Scribe. 04/05/17. 7:04 PM.  History   Chief Complaint Chief Complaint  Patient presents with  . Knee Injury    HPI Franklin [name] is a 21 y.o. male who presents to the Emergency Department complaining of bilateral knee injury occurring 6 days ago. Pt reports associated bilateral knee pain and abrasions to bilateral knees. Pt has tried applying gauze without medications with no relief of his symptoms. He notes that he was running across the street when he slipped and fell causing abrasions to his bilateral knees. He states that when he was ambulating yesterday he felt a "squeezing" sensation to his bilateral knees. Per pt chart review, pt last tetanus vaccination was on 06/30/2015. He denies color change, swelling, and any other symptoms.  Patient notes that he had gauze placed on the wounds that have been there since the initial injury.   The history is provided by the patient. No language interpreter was used.    Past Medical History:  Diagnosis Date  . Prematurity   . Retinopathy of prematurity   . Seizures [facility](HCC)     Patient Active Problem List   Diagnosis Date Noted  . 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 Long-term use of high-risk medication 06/14/2013    Past Surgical History:  Procedure Laterality Date  . Central line placement     As premature infant  . REFRACTIVE SURGERY     For retinopathy of prematurity       Home Medications    Prior to Admission medications   Medication Sig Start Date End Date Taking? Authorizing Provider  LAMICTAL XR 100 MG TB24 Take 1 tablet every morning along with Lamictal XR 250mg  03/26/17   Deetta PerlaHickling, William H, MD  LAMICTAL XR 200 MG TB24  Take 1 tablet at night along with a Lamictal XR 300mg  tablet 03/26/17   Deetta PerlaHickling, William H, MD  LAMICTAL XR 250 MG TB24 Take 1 tablet in the morning along with Lamictal XR 100mg  03/26/17   Deetta PerlaHickling, William H, MD  LAMICTAL XR 300 MG TB24 Take 1 tablet at night along with Lamictal XR 200mg  03/26/17   Deetta PerlaHickling, William H, MD    Family History Family History  Problem Relation Age of Onset  . Diabetes type II Father   . Hypercholesterolemia Father   . Hypertension Father   . Stroke Paternal Grandmother   . Autism Brother        Also has cognitive delays  . Stroke Paternal Aunt     Social History Social History  Substance Use Topics  . Smoking status: Never Smoker  . Smokeless tobacco: Never Used  . Alcohol use No     Allergies   Concerta [methylphenidate]   Review of Systems Review of Systems  Musculoskeletal: Positive for arthralgias (bilateral knee). Negative for joint swelling.  Skin: Positive for wound (abrasions to both knees). Negative for color change.  All other systems reviewed and are negative.    Physical Exam Updated Vital Signs BP 114/77 (BP Location: Right Arm)   Pulse 81   Temp 98.6 F (37 C) (Oral)   Resp 16   SpO2 100%   Physical Exam  Constitutional: He is oriented to person, place, and time. He appears  well-developed and well-nourished. No distress.  HENT:  Head: Normocephalic and atraumatic.  Eyes: EOM are normal.  Neck: Neck supple.  Cardiovascular: Normal rate.   Pulmonary/Chest: Effort normal. No respiratory distress.  Abdominal: He exhibits no distension.  Musculoskeletal: Normal range of motion.  Abrasions noted to bilateral anterior knee, no signs of surrounding infection  Neurological: He is alert and oriented to person, place, and time.  Skin: Skin is warm and dry.  Psychiatric: He has a normal mood and affect. His behavior is normal.  Nursing note and vitals reviewed.    ED Treatments / Results  DIAGNOSTIC STUDIES: Oxygen  Saturation is 100% on RA, nl by my interpretation.    COORDINATION OF CARE: 6:33 PM Discussed treatment plan with pt at bedside and pt agreed to plan.   Labs (all labs ordered are listed, but only abnormal results are displayed) Labs Reviewed - No data to display  EKG  EKG Interpretation None       Radiology Dg Knee Complete 4 Views Left  Result Date: 04/05/2017 CLINICAL DATA:  Bilateral knee pain after falling today. EXAM: LEFT KNEE - COMPLETE 4+ VIEW COMPARISON:  None. FINDINGS: No evidence of fracture, dislocation, or joint effusion. No evidence of arthropathy or other focal bone abnormality. Soft tissues are unremarkable. IMPRESSION: Normal examination. Electronically Signed   By: Beckie SaltsSteven  Reid M.D.   On: 04/05/2017 19:11   Dg Knee Complete 4 Views Right  Result Date: 04/05/2017 CLINICAL DATA:  Bilateral knee pain after falling today. EXAM: RIGHT KNEE - COMPLETE 4+ VIEW COMPARISON:  None. FINDINGS: No evidence of fracture, dislocation, or joint effusion. No evidence of arthropathy or other focal bone abnormality. Soft tissues are unremarkable. IMPRESSION: Normal examination. Electronically Signed   By: Beckie SaltsSteven  Reid M.D.   On: 04/05/2017 19:11    Procedures Procedures (including critical care time)  Medications Ordered in ED Medications - No data to display   Initial Impression / Assessment and Plan / ED Course  I have reviewed the triage vital signs and the nursing notes.  Pertinent labs & imaging results that were available during my care of the patient were reviewed by me and considered in my medical decision making (see chart for details).      Labs:   Imaging:  Consults:  Therapeutics:  Discharge Meds:   Assessment/Plan: Pt presents with bilateral knee pain and abrasions s/p fall 6 days ago. No infectious etiology. Plain films negative for abnormalities.  Discharged home with symptomatic care instructions. Patient will be discharged home & is agreeable with  above plan. Returns precautions discussed. Pt appears safe for discharge.    Final Clinical Impressions(s) / ED Diagnoses   Final diagnoses:  Fall  Abrasion  Acute pain of both knees    New Prescriptions New Prescriptions   No medications on file   I personally performed the services described in this documentation, which was scribed in my presence. The recorded information has been reviewed and is accurate.     Eyvonne MechanicHedges, Katti Pelle, PA-C 04/05/17 1945    Tilden Fossaees, Elizabeth, MD 04/06/17 [phone]

## 2017-04-05 NOTE — ED Triage Notes (Signed)
Patient c/o of bilat knee pain from fall last week. Patient has bandages on left knee. patient still having pain so wants legs checked out.

## 2017-04-30 ENCOUNTER — Encounter (INDEPENDENT_AMBULATORY_CARE_PROVIDER_SITE_OTHER): Payer: Self-pay | Admitting: Family

## 2018-03-01 ENCOUNTER — Telehealth (INDEPENDENT_AMBULATORY_CARE_PROVIDER_SITE_OTHER): Payer: Self-pay | Admitting: Family

## 2018-03-01 DIAGNOSIS — G40309 Generalized idiopathic epilepsy and epileptic syndromes, not intractable, without status epilepticus: Secondary | ICD-10-CM

## 2018-03-01 MED ORDER — LAMICTAL XR 300 MG PO TB24: ORAL_TABLET | ORAL | 3 refills | Status: DC

## 2018-03-01 MED ORDER — LAMICTAL XR 100 MG PO TB24: ORAL_TABLET | ORAL | 3 refills | Status: DC

## 2018-03-01 MED ORDER — LAMICTAL XR 200 MG PO TB24: ORAL_TABLET | ORAL | 3 refills | Status: DC

## 2018-03-01 MED ORDER — LAMICTAL XR 250 MG PO TB24: ORAL_TABLET | ORAL | 3 refills | Status: DC

## 2018-03-01 NOTE — Telephone Encounter (Signed)
I called and talked to Dad. He said that he needed new Rx's faxed to [employer] [facility] Program [place]. I faxed the Rx's as requested. TG

## 2018-03-01 NOTE — Telephone Encounter (Signed)
°  Who's calling (name and relationship to patient) : Franklin HaileyDustin (patient)  Best contact number: [phone]  Provider they see: Goodpasture  Reason for call:  Called again about his refills and an assistance program to get his medication.  Please call.    PRESCRIPTION REFILL ONLY  Name of prescription:  Pharmacy:

## 2018-03-01 NOTE — Telephone Encounter (Signed)
°  Who's calling (name and relationship to patient) : [name] (Self) Best contact number: [phone] Provider they see: [name] Reason for call: Pt stated he needs refills on all dosages of Lamictal. Pt stated that he has no insurance. He mentioned an assistance program that he went through previously to be able to get his medication and wants to know if he could utilize the program again. Please advise.

## 2018-03-07 ENCOUNTER — Encounter (INDEPENDENT_AMBULATORY_CARE_PROVIDER_SITE_OTHER): Payer: Self-pay | Admitting: Family

## 2018-03-07 ENCOUNTER — Ambulatory Visit (INDEPENDENT_AMBULATORY_CARE_PROVIDER_SITE_OTHER): Payer: Medicaid Other | Admitting: Family

## 2018-03-07 VITALS — BP 118/74 | HR 104 | Ht 68.5 in | Wt 130.6 lb

## 2018-03-07 DIAGNOSIS — G40309 Generalized idiopathic epilepsy and epileptic syndromes, not intractable, without status epilepticus: Secondary | ICD-10-CM

## 2018-03-07 DIAGNOSIS — Z87898 Personal history of other specified conditions: Secondary | ICD-10-CM

## 2018-03-07 DIAGNOSIS — F819 Developmental disorder of scholastic skills, unspecified: Secondary | ICD-10-CM

## 2018-03-07 NOTE — Progress Notes (Signed)
Patient: Franklin [name] MRN: [id number] Sex: male DOB: 24-Sep-1995  Provider: Elveria Rising, NP Location of Care: [facility]  Note type: Routine return visit  History of Present Illness: Referral Source: [facility] History from: father, patient and [facility] chart Chief Complaint: Seizures  Franklin [name] is a 22 y.o. young man with history of generalized convulsive epilepsy. He was last seen December 19. 2017. Franklin [name] has history of prematurity, intellectual disability and a generalized convulsive seizures. He is taking and tolerating Lamictal XR that he receives through a patient assistance program. Franklin [name] has occasional breakthrough seizures usually in the setting of sleep deprivation with the most recent occurring about a month ago.   Franklin [name] has been unable to find employment and spends his days playing video games and walking to [employer] about 1.5 miles from his home. There are no problems with behavior. Franklin [name] has been otherwise generally healthy and neither he nor his father have other health concerns for   today other than previously mentioned.  Review of Systems: Please see the HPI for neurologic and other pertinent review of systems. Otherwise, all other systems were reviewed and were negative.    Past Medical History:  Diagnosis Date  . Prematurity   . Retinopathy of prematurity   . Seizures (HCC)    Hospitalizations: No., Head Injury: No., Nervous System Infections: No., Immunizations up to date: Yes.   Past Medical History Comments: See HPI   Surgical History Past Surgical History:  Procedure Laterality Date  . Central line placement     As premature infant  . REFRACTIVE SURGERY     For retinopathy of prematurity    Family History family history includes Autism in his brother; Diabetes type II in his father; Hypercholesterolemia in his father; Hypertension in his father; Stroke in his paternal aunt and paternal  grandmother. Family History is otherwise negative for migraines, seizures, cognitive impairment, blindness, deafness, birth defects, chromosomal disorder, autism.  Social History Social History   Socioeconomic History  . Marital status: Single    Spouse name: Not on file  . Number of children: Not on file  . Years of education: Not on file  . Highest education level: Not on file  Occupational History  . Not on file  Social Needs  . Financial resource strain: Not on file  . Food insecurity:    Worry: Not on file    Inability: Not on file  . Transportation needs:    Medical: Not on file    Non-medical: Not on file  Tobacco Use  . Smoking status: Never Smoker  . Smokeless tobacco: Never Used  Substance and Sexual Activity  . Alcohol use: No  . Drug use: No  . Sexual activity: Never  Lifestyle  . Physical activity:    Days per week: Not on file    Minutes per session: Not on file  . Stress: Not on file  Relationships  . Social connections:    Talks on phone: Not on file    Gets together: Not on file    Attends religious service: Not on file    Active member of club or organization: Not on file    Attends meetings of clubs or organizations: Not on file    Relationship status: Not on file  Other Topics Concern  . Not on file  Social History Narrative   Parents are divorced. Franklin [name] lives with his father.    Allergies Allergies  Allergen Reactions  .  Concerta [Methylphenidate] Other (See Comments)    Would start crying for little things like when someone coughed or said a specific word for example.     Physical Exam BP 118/74   Pulse (!) 104   Ht 5' 8.5" (1.74 m)   Wt 130 lb 9.6 oz (59.2 kg)   BMI 19.57 kg/m  General: thin young man, seated on exam table, in no evident distress; brown hair, brown eyes, right handed Head: normocephalic and atraumatic. Oropharynx benign. No dysmorphic features. Neck: supple with no carotid bruits. No focal  tenderness. Cardiovascular: regular rate and rhythm, no murmurs. Respiratory: Clear to auscultation bilaterally Abdomen: Bowel sounds present all four quadrants, abdomen soft, non-tender, non-distended. No hepatosplenomegaly or masses palpated. Musculoskeletal: No skeletal deformities or obvious scoliosis Skin: Mild facial acne. Small reddish lesion on right parietal scalp  Neurologic Exam Mental Status: Awake and fully alert.  Attention span, concentration, and fund of knowledge subnormal for age.  Speech fluent without dysarthria.  Able to follow commands and participate in examination. Cranial Nerves: Fundoscopic exam - red reflex present.  Unable to fully visualize fundus.  Pupils equal briskly reactive to light.  Extraocular movements full without nystagmus.  Visual fields full to confrontation.  Hearing intact and symmetric to finger rub.  Facial sensation intact.  Face, tongue, palate move normally and symmetrically.  Neck flexion and extension normal. Motor: Normal bulk and tone.  Normal strength in all tested extremity muscles. Sensory: Intact to touch and temperature in all extremities. Coordination: Rapid movements: finger and toe tapping normal and symmetric bilaterally.  Finger-to-nose and heel-to-shin intact bilaterally.  Able to balance on either foot. Romberg negative. Gait and Station: Arises from chair, without difficulty. Stance is normal.  Gait demonstrates normal stride length and balance. Able to heel, toe and tandem walk without difficulty. Reflexes: Diminished and symmetric. Toes downgoing. No clonus.  Impression 1.  Generalized convulsive epilepsy 2.  Intellectual delay 3.  History of prematurity  Recommendations for plan of care The patient's previous [facility] records were reviewed. Franklin [name] has neither had nor required imaging or lab studies since the last visit. He is a 22 year old young man with history of generalized convulsive epilepsy, intellectual delay and history of  prematurity. He is taking and tolerating Lamictal XR for his seizure disorder. Franklin [name] has occasional breakthrough seizures usually in the setting of sleep deprivation. I talked with Franklin [name] and his father about this and encouraged him to get at least 8-9 hours of sleep, and to stay on a regular sleep schedule. I asked Dad to let me know when Franklin [name] has breakthrough seizures. I will see him back in follow up in 1 year or sooner if needed. Franklin [name] and his father agreed with the plans made today.  The medication list was reviewed and reconciled.  No changes were made in the prescribed medications today.  A complete medication list was provided to the patient's father.  Allergies as of 03/07/2018      Reactions   Concerta [methylphenidate] Other (See Comments)   Would start crying for little things like when someone coughed or said a specific word for example.       Medication List        Accurate as of 03/07/18 11:59 PM. Always use your most recent med list.          LAMICTAL XR 100 MG Tb24 24 hour tablet Generic drug:  LamoTRIgine Take 1 tablet every morning along with Lamictal XR 250mg    LAMICTAL  XR 200 MG Tb24 24 hour tablet Generic drug:  LamoTRIgine Take 1 tablet at night along with a Lamictal XR 300mg  tablet   LAMICTAL XR 250 MG Tb24 24 hour tablet Generic drug:  LamoTRIgine Take 1 tablet in the morning along with Lamictal XR 100mg    LAMICTAL XR 300 MG Tb24 24 hour tablet Generic drug:  LamoTRIgine Take 1 tablet at night along with Lamictal XR 200mg        Total time spent with the patient was 20 minutes, of which 50% or more was spent in counseling and coordination of care.   Elveria Rising NP-C

## 2018-03-08 NOTE — Telephone Encounter (Signed)
I called and talked with Dad. The [employer] Patient Assistance program application needs to be submitted but cannot be submitted without Franklin [name]'s signature. I asked Dad to come to office to sign form. He and Franklin [name] came and signed the form. I faxed to [employer] Patient Assistance and will follow up to see that it was received. TG

## 2018-03-08 NOTE — Telephone Encounter (Signed)
Spoke with patient. He stated he does not have his refills yet and would like to speak with [name] at her earliest convenience.

## 2018-03-10 ENCOUNTER — Encounter (INDEPENDENT_AMBULATORY_CARE_PROVIDER_SITE_OTHER): Payer: Self-pay | Admitting: Family

## 2018-03-10 DIAGNOSIS — F819 Developmental disorder of scholastic skills, unspecified: Secondary | ICD-10-CM | POA: Insufficient documentation

## 2018-03-10 DIAGNOSIS — Z87898 Personal history of other specified conditions: Secondary | ICD-10-CM | POA: Insufficient documentation

## 2018-03-10 NOTE — Patient Instructions (Signed)
Thank you for coming in today.   Instructions for you until your next appointment are as follows: 1.  Continue taking the Lamictal XR as you have been doing. Try not to miss any doses.  2.  Remember that it is important to get at least 8-9 hours of sleep at night, and try to stay on a regular sleep schedule.  3.  I will send in the information for the patient assistance program for the Lamictal XR 4.  Please sign up for MyChart if you have not done so 5.  Please plan to return for follow up in one year or sooner if needed.

## 2018-03-11 MED ORDER — LAMICTAL XR 200 MG PO TB24: ORAL_TABLET | ORAL | 0 refills | Status: DC

## 2018-03-11 MED ORDER — LAMICTAL XR 300 MG PO TB24: ORAL_TABLET | ORAL | 0 refills | Status: DC

## 2018-03-11 MED ORDER — LAMICTAL XR 100 MG PO TB24: ORAL_TABLET | ORAL | 0 refills | Status: DC

## 2018-03-11 MED ORDER — LAMICTAL XR 250 MG PO TB24: ORAL_TABLET | ORAL | 0 refills | Status: DC

## 2018-03-11 NOTE — Addendum Note (Signed)
Addended by: Princella IonGOODPASTURE, Lysle Yero P on: 03/11/2018 12:19 PM   Modules accepted: Orders

## 2018-03-11 NOTE — Telephone Encounter (Signed)
I called Dad and left a message telling him that the application was approved and that he has 3 business days from July 3rd to pick up an emergency supply at the pharmacy. I invited Dad to call back if he has any questions. TG

## 2018-03-11 NOTE — Telephone Encounter (Signed)
I called [employer] Patient Assistance to check on the application and learned that Franklin [name] was approved as of 03/09/18. The rep gave me information for getting an emergency supply at the local pharmacy, and [facility] with that information. TG

## 2018-04-08 ENCOUNTER — Telehealth (INDEPENDENT_AMBULATORY_CARE_PROVIDER_SITE_OTHER): Payer: Self-pay | Admitting: Family

## 2018-04-08 NOTE — Telephone Encounter (Signed)
°  Who's calling (name and relationship to patient) : Father/Harvey  Best contact number: [phone]  Provider they see: Sula Sodaina G.   Reason for call: Dad called and left vmail, stated that pt had had a seizure this morning around 10:40am, it only lasted about 3 seconds long. Dad stated that he just wanted to let Provider know, not sure if there is anything that needs to be done at this point, if so to please contact him on his mobile [phone].

## 2018-04-08 NOTE — Telephone Encounter (Signed)
Spoke with dad to inform him that we did receive his message. I also informed him that [name] is out of the office until Monday. He stated that there is probably really nothing that can be done and he just wanted to let her know.

## 2018-05-28 ENCOUNTER — Telehealth (INDEPENDENT_AMBULATORY_CARE_PROVIDER_SITE_OTHER): Payer: Self-pay | Admitting: Pediatrics

## 2018-05-28 NOTE — Telephone Encounter (Signed)
20-minute phone call received at 10:20 PM.  Patient had his third seizure in 3 weeks 2 there were witnessed and were brief generalized tonic-clonic seizures with postictal confusion.  The third he was found on the floor confused and was presumed to have a seizure.  He continues to have very poor sleep hygiene is up late at nighttime.  I told his mother and father who is on speaker phone that we would not be able control his seizures if he did not do better job with controlling his sleep.  He is on a fairly high dose of lamotrigine in the form of Lamictal XR.  He has not had a drug level since 2015.  This needs to happen.  I will discuss this with [name] on Monday we will make an order.  The family wants it at the [facility] on [address] in [place].

## 2018-05-30 ENCOUNTER — Telehealth (INDEPENDENT_AMBULATORY_CARE_PROVIDER_SITE_OTHER): Payer: Self-pay | Admitting: Family

## 2018-05-30 NOTE — Telephone Encounter (Signed)
Who's calling (name and relationship to patient) : Sharyl NimrodMeredith (Mother) Best contact number: [phone] Provider they see: [name]  Reason for call: Mom called to report pt's seizures. Mom spoke with on-call Provider.    Call ID: [phone]

## 2018-06-09 ENCOUNTER — Telehealth: Payer: Self-pay | Admitting: Family

## 2018-06-09 DIAGNOSIS — G40309 Generalized idiopathic epilepsy and epileptic syndromes, not intractable, without status epilepticus: Secondary | ICD-10-CM

## 2018-06-09 DIAGNOSIS — Z79899 Other long term (current) drug therapy: Secondary | ICD-10-CM

## 2018-06-09 NOTE — Telephone Encounter (Signed)
°  Who's calling (name and relationship to patient) : Admiral, Marcucci (Father)  Best contact number: [phone] (H)  Provider they see: Elveria Rising  Reason for call: patients father left voicemail stating that he has not received a call back in regards to patients lab   I left message on his line advising message would be routed to provider

## 2018-06-10 NOTE — Telephone Encounter (Signed)
Spoke with dad to inform him that orders have been created and faxed to the [facility] on [address]

## 2018-06-10 NOTE — Telephone Encounter (Signed)
Laboratories her CBC with differential and morning trough lamotrigine level which father wants done at the [facility] on the corner of [address] and [address].  This will probably have to be done next week.  I have printed the orders and will place them on your desk.  Thank you

## 2018-06-10 NOTE — Telephone Encounter (Signed)
Spoke with dad to get more information about his phone message. He states that he was supposed to hear back from Dr. Sharene Skeans about lab orders that needed to be done. He states that he has not heard back and was trying to figure out where to go from here. Please advise

## 2018-06-13 ENCOUNTER — Telehealth (INDEPENDENT_AMBULATORY_CARE_PROVIDER_SITE_OTHER): Payer: Self-pay | Admitting: Pediatrics

## 2018-06-13 NOTE — Telephone Encounter (Addendum)
CBC with differential is normal.  I will hold off until the lamotrigine level before contacting the family.

## 2018-06-17 LAB — CBC WITH DIFFERENTIAL/PLATELET
BASOS ABS: 38 {cells}/uL (ref 0–200)
Basophils Relative: 0.8 %
EOS PCT: 3.5 %
Eosinophils Absolute: 168 cells/uL (ref 15–500)
HEMATOCRIT: 46.3 % (ref 38.5–50.0)
Hemoglobin: 16.2 g/dL (ref 13.2–17.1)
LYMPHS ABS: 1622 {cells}/uL (ref 850–3900)
MCH: 30.6 pg (ref 27.0–33.0)
MCHC: 35 g/dL (ref 32.0–36.0)
MCV: 87.5 fL (ref 80.0–100.0)
MPV: 10.3 fL (ref 7.5–12.5)
Monocytes Relative: 8.9 %
NEUTROS PCT: 53 %
Neutro Abs: 2544 cells/uL (ref 1500–7800)
PLATELETS: 320 10*3/uL (ref 140–400)
RBC: 5.29 10*6/uL (ref 4.20–5.80)
RDW: 12.1 % (ref 11.0–15.0)
TOTAL LYMPHOCYTE: 33.8 %
WBC mixed population: 427 cells/uL (ref 200–950)
WBC: 4.8 10*3/uL (ref 3.8–10.8)

## 2018-06-17 LAB — LAMOTRIGINE LEVEL: Lamotrigine Lvl: 19.8 ug/mL — ABNORMAL HIGH (ref 4.0–18.0)

## 2018-06-21 ENCOUNTER — Telehealth (INDEPENDENT_AMBULATORY_CARE_PROVIDER_SITE_OTHER): Payer: Self-pay | Admitting: Pediatrics

## 2018-06-21 NOTE — Telephone Encounter (Signed)
CBC is normal.  Lamotrigine level is in the therapeutic range.  I was unable to leave a message.

## 2018-06-25 ENCOUNTER — Encounter (INDEPENDENT_AMBULATORY_CARE_PROVIDER_SITE_OTHER): Payer: Self-pay | Admitting: Pediatrics

## 2018-06-27 ENCOUNTER — Telehealth (INDEPENDENT_AMBULATORY_CARE_PROVIDER_SITE_OTHER): Payer: Self-pay | Admitting: Family

## 2018-06-27 NOTE — Telephone Encounter (Signed)
I placed the communication letter in the mail

## 2018-06-27 NOTE — Telephone Encounter (Signed)
°  Who's calling (name and relationship to patient) : [name] (Father)  Best contact number: [phone] Provider they see: Inetta Fermo  Reason for call: Dad called and stated pt had a seizure. Please advise.

## 2018-06-27 NOTE — Telephone Encounter (Signed)
Tried to call or l/m but it rang three times and hung up

## 2018-07-01 ENCOUNTER — Encounter (INDEPENDENT_AMBULATORY_CARE_PROVIDER_SITE_OTHER): Payer: Self-pay | Admitting: Pediatrics

## 2018-07-22 ENCOUNTER — Telehealth (INDEPENDENT_AMBULATORY_CARE_PROVIDER_SITE_OTHER): Payer: Self-pay | Admitting: Family

## 2018-07-22 NOTE — Telephone Encounter (Signed)
I called and talked to Dad. He said that Franklin [name] had a convulsive seizure today lasting about 30 seconds. He was not injured with the seizure. Dad said that Franklin [name] stayed up very late last night playing video games. He has not missed any medicine doses. I encouraged Dad to try to get Franklin [name] to get more sleep. I do not want to make changes in his medicine plan when seizures are triggered by sleep deprivation. Dad agreed with this plan. TG

## 2018-07-22 NOTE — Telephone Encounter (Signed)
°  Who's calling (name and relationship to patient) : Lorella NimrodHarvey (dad) Best contact number: [phone] Provider they see: Blane OharaGoodpasture Reason for call: Dad called to let [name] know patent had a siezure    PRESCRIPTION REFILL ONLY  Name of prescription:  Pharmacy:

## 2018-08-01 NOTE — Telephone Encounter (Signed)
Noted. TG 

## 2018-08-01 NOTE — Telephone Encounter (Signed)
Franklin [name] (dad) called to say that Franklin [name] had a mild seizures, he also stated that Franklin [name] stayed up late again last night and had mild seizure this morning that lasted maybe about 15 seconds and that he also fell out of bed but was not hurt.

## 2018-09-14 ENCOUNTER — Emergency Department (HOSPITAL_COMMUNITY): Payer: Self-pay

## 2018-09-14 ENCOUNTER — Emergency Department (HOSPITAL_COMMUNITY)
Admission: EM | Admit: 2018-09-14 | Discharge: 2018-09-14 | Disposition: A | Discharge status: Home / Self Care | Payer: Self-pay | Attending: Emergency Medicine | Admitting: Emergency Medicine

## 2018-09-14 ENCOUNTER — Encounter (HOSPITAL_COMMUNITY): Payer: Self-pay | Admitting: Emergency Medicine

## 2018-09-14 DIAGNOSIS — S0181XA Laceration without foreign body of other part of head, initial encounter: Secondary | ICD-10-CM | POA: Insufficient documentation

## 2018-09-14 DIAGNOSIS — Y929 Unspecified place or not applicable: Secondary | ICD-10-CM | POA: Insufficient documentation

## 2018-09-14 DIAGNOSIS — Z23 Encounter for immunization: Secondary | ICD-10-CM | POA: Insufficient documentation

## 2018-09-14 DIAGNOSIS — R569 Unspecified convulsions: Secondary | ICD-10-CM | POA: Insufficient documentation

## 2018-09-14 DIAGNOSIS — W1789XA Other fall from one level to another, initial encounter: Secondary | ICD-10-CM | POA: Insufficient documentation

## 2018-09-14 DIAGNOSIS — Y999 Unspecified external cause status: Secondary | ICD-10-CM | POA: Insufficient documentation

## 2018-09-14 DIAGNOSIS — Y939 Activity, unspecified: Secondary | ICD-10-CM | POA: Insufficient documentation

## 2018-09-14 DIAGNOSIS — S8001XA Contusion of right knee, initial encounter: Secondary | ICD-10-CM | POA: Insufficient documentation

## 2018-09-14 DIAGNOSIS — Z79899 Other long term (current) drug therapy: Secondary | ICD-10-CM | POA: Insufficient documentation

## 2018-09-14 MED ORDER — TETANUS-DIPHTH-ACELL PERTUSSIS 5-2.5-18.5 LF-MCG/0.5 IM SUSP
0.5000 mL | Freq: Once | INTRAMUSCULAR | Status: AC
  Administered 2018-09-14: 0.5 mL via INTRAMUSCULAR
  Filled 2018-09-14: qty 0.5

## 2018-09-14 MED ORDER — LIDOCAINE-EPINEPHRINE (PF) 2 %-1:200000 IJ SOLN
10.0000 mL | Freq: Once | INTRAMUSCULAR | Status: AC
  Administered 2018-09-14: 10 mL
  Filled 2018-09-14: qty 20

## 2018-09-14 NOTE — ED Provider Notes (Addendum)
[facility]-EMERGENCY DEPT Provider Note   CSN: [id number] Arrival date & time: 09/14/18  1435     History   Chief Complaint Chief Complaint  Patient presents with  . Seizures  . Head Laceration  . Knee Pain    HPI Franklin [name] is a 23 y.o. male.  HPI Patient presents after seizure.  History of seizure disorders.  On Lamictal managed by child neurology.  Had a seizure where he fell on apparently hit his head.  Patient does not remember.  Complaining of pain in his right knee and a laceration to his right temple area.  Has been taking his medicine reportedly is been sleeping well.  Patient's father states they attempted to call Dr. Sharene Skeans but were unable to get through.  Also pain to right knee.  States he is able to walk but does have some pain with it. Past Medical History:  Diagnosis Date  . Prematurity   . Retinopathy of prematurity   . Seizures [facility])     Patient Active Problem List   Diagnosis Date Noted  . Intellectual delay 03/10/2018  . Personal history of prematurity 03/10/2018  . 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 Long-term use of high-risk medication 06/14/2013    Past Surgical History:  Procedure Laterality Date  . Central line placement     As premature infant  . REFRACTIVE SURGERY     For retinopathy of prematurity        Home Medications    Prior to Admission medications   Medication Sig Start Date End Date Taking? Authorizing Provider  acetaminophen (TYLENOL) 500 MG tablet Take 1,000 mg by mouth every 6 (six) hours as needed for mild pain.   Yes [provider]  LAMICTAL XR 100 MG TB24 24 hour tablet Take 1 tablet every morning along with Lamictal XR 250mg  Patient taking differently: Take 100 mg by mouth daily. Take along with Lamictal XR 250mg  03/11/18  Yes Goodpasture, Inetta Fermo, NP  LAMICTAL XR 200 MG TB24 24 hour tablet Take 1 tablet at night along with a Lamictal XR 300mg  tablet Patient taking  differently: Take 200 mg by mouth at bedtime. Take along with Lamictal XR 300mg  tablet 03/11/18  Yes Goodpasture, Inetta Fermo, NP  LAMICTAL XR 250 MG TB24 24 hour tablet Take 1 tablet in the morning along with Lamictal XR 100mg  Patient taking differently: Take 250 mg by mouth daily. Take along with Lamictal XR 100mg  03/11/18  Yes Goodpasture, Inetta Fermo, NP  LAMICTAL XR 300 MG TB24 24 hour tablet Take 1 tablet at night along with Lamictal XR 200mg  Patient taking differently: Take 300 mg by mouth at bedtime. Take along with Lamictal XR 200mg  03/11/18  Yes Elveria Rising, NP    Family History Family History  Problem Relation Age of Onset  . Diabetes type II Father   . Hypercholesterolemia Father   . Hypertension Father   . Stroke Paternal Grandmother   . Autism Brother        Also has cognitive delays  . Stroke Paternal Aunt     Social History Social History   Tobacco Use  . Smoking status: Never Smoker  . Smokeless tobacco: Never Used  Substance Use Topics  . Alcohol use: No  . Drug use: No     Allergies   Concerta [methylphenidate]   Review of Systems Review of Systems  Constitutional: Negative for appetite change.  HENT: Negative for congestion.   Respiratory: Negative for shortness of breath.  Cardiovascular: Negative for chest pain.  Gastrointestinal: Negative for abdominal pain.  Genitourinary: Negative for flank pain.  Musculoskeletal: Negative for back pain.  Skin: Positive for wound.  Neurological: Positive for syncope and headaches.     Physical Exam Updated Vital Signs BP 104/71   Pulse 74   Temp (!) 97.3 F (36.3 C) (Oral)   Resp 18   Ht 5' 9.5" (1.765 m)   Wt 58.8 kg   SpO2 100%   BMI 18.86 kg/m   Physical Exam HENT:     Head:     Comments: 3 cm laceration right temple area.    Mouth/Throat:     Mouth: Mucous membranes are dry.  Eyes:     Extraocular Movements: Extraocular movements intact.  Neck:     Musculoskeletal: Neck supple.  Pulmonary:      Breath sounds: Normal breath sounds.  Abdominal:     Palpations: Abdomen is soft.  Musculoskeletal:        General: Tenderness present.     Comments: Mild tenderness to right knee anteriorly.  Good range of motion.  Knee stable.  No effusion.  Skin:    General: Skin is warm.     Capillary Refill: Capillary refill takes less than 2 seconds.  Neurological:     General: No focal deficit present.     Mental Status: He is alert.  Psychiatric:        Mood and Affect: Mood normal.      ED Treatments / Results  Labs (all labs ordered are listed, but only abnormal results are displayed) Labs Reviewed - No data to display  EKG None  Radiology Dg Knee 2 Views Right  Result Date: 09/14/2018 CLINICAL DATA:  Recent seizure with right knee pain, initial encounter EXAM: RIGHT KNEE - 2 VIEW COMPARISON:  04/05/2017 FINDINGS: No evidence of fracture, dislocation, or joint effusion. No evidence of arthropathy or other focal bone abnormality. Soft tissues are unremarkable. IMPRESSION: No acute abnormality noted. Electronically Signed   By: Alcide CleverMark  Lukens M.D.   On: 09/14/2018 16:16   Ct Head Wo Contrast  Result Date: 09/14/2018 CLINICAL DATA:  Head trauma, minor, GCS>=13, high clinical risk, initial exam. Right head laceration. Post seizure, patient with seizure history. EXAM: CT HEAD WITHOUT CONTRAST TECHNIQUE: Contiguous axial images were obtained from the base of the skull through the vertex without intravenous contrast. COMPARISON:  Head CT 06/30/2015 FINDINGS: Brain: Generalized atrophy, age advanced but stable from prior exam. No intracranial hemorrhage, mass effect, or midline shift. No hydrocephalus. The basilar cisterns are patent. No evidence of territorial infarct or acute ischemia. No extra-axial or intracranial fluid collection. Vascular: No hyperdense vessel. Skull: No fracture or focal lesion. Sinuses/Orbits: Paranasal sinuses and mastoid air cells are clear. The visualized orbits are  unremarkable. Other: None. IMPRESSION: 1. No acute intracranial abnormality. No skull fracture. 2. Generalized atrophy, age advanced but stable from 2016 exam. Electronically Signed   By: Narda RutherfordMelanie  Sanford M.D.   On: 09/14/2018 19:40    Procedures .Marland Kitchen.Laceration Repair Date/Time: 09/14/2018 8:03 PM Performed by: Benjiman CorePickering, Angela Vazguez, MD Authorized by: Benjiman CorePickering, Aubery Date, MD   Consent:    Consent obtained:  Verbal   Consent given by:  Patient   Risks discussed:  Infection, pain, poor cosmetic result, poor wound healing, nerve damage and need for additional repair   Alternatives discussed:  No treatment and delayed treatment Anesthesia (see MAR for exact dosages):    Anesthesia method:  Local infiltration   Local anesthetic:  Lidocaine  2% WITH epi Laceration details:    Location:  Face   Face location:  Forehead   Length (cm):  3 Repair type:    Repair type:  Simple Pre-procedure details:    Preparation:  Patient was prepped and draped in usual sterile fashion Exploration:    Hemostasis achieved with:  Epinephrine   Wound exploration: wound explored through full range of motion     Contaminated: no   Treatment:    Area cleansed with:  Hibiclens   Amount of cleaning:  Standard Skin repair:    Repair method:  Sutures   Suture size:  5-0   Suture material:  Prolene   Suture technique:  Simple interrupted   Number of sutures:  7 Approximation:    Approximation:  Close Post-procedure details:    Dressing:  Open (no dressing)   Patient tolerance of procedure:  Tolerated well, no immediate complications   (including critical care time)  Medications Ordered in ED Medications  lidocaine-EPINEPHrine (XYLOCAINE W/EPI) 2 %-1:200000 (PF) injection 10 mL (10 mLs Infiltration Given 09/14/18 1801)  Tdap (BOOSTRIX) injection 0.5 mL (0.5 mLs Intramuscular Given 09/14/18 1802)     Initial Impression / Assessment and Plan / ED Course  I have reviewed the triage vital signs and the nursing  notes.  Pertinent labs & imaging results that were available during my care of the patient were reviewed by me and considered in my medical decision making (see chart for details).     Patient with seizure.  Facial laceration.  Head CT done due to laceration on head.  Head CT reassuring.  Wound closed.  Discharge home.  Knee pain but negative x-ray.  Follow-up as an outpatient as needed.  Final Clinical Impressions(s) / ED Diagnoses   Final diagnoses:  Seizure [facility])  Facial laceration, initial encounter    ED Discharge Orders    None       Benjiman Core, MD 09/14/18 Serita Grammes, MD 09/14/18 2007

## 2018-09-14 NOTE — ED Triage Notes (Signed)
Pt reports hx seizure and had one today. Has right laceration on head. Reports takes Lamictal for seizures.

## 2018-09-14 NOTE — Discharge Instructions (Addendum)
Have the stitches taken out in around 5 days. 

## 2018-09-15 ENCOUNTER — Telehealth (INDEPENDENT_AMBULATORY_CARE_PROVIDER_SITE_OTHER): Payer: Self-pay | Admitting: Family

## 2018-09-15 NOTE — Telephone Encounter (Signed)
I called and talked to Dad. He said that Franklin [name] had stayed up very late the night before and then had a seizure lasting about a minute. He was taken to ER only because he was injured and the laceration needed sutures. He has not missed any medication. Dad tries to get him to go to bed at night but [facility] frequently stays up very late. I asked Dad to let me know if Franklin [name] has any more seizures. TG

## 2018-09-15 NOTE — Telephone Encounter (Signed)
Noted, thank you I agree with a conservative approach given the circumstances.

## 2018-09-22 ENCOUNTER — Emergency Department (HOSPITAL_COMMUNITY)
Admission: EM | Admit: 2018-09-22 | Discharge: 2018-09-22 | Discharge status: Absent without leave | Payer: Medicaid Other

## 2018-09-23 ENCOUNTER — Telehealth (INDEPENDENT_AMBULATORY_CARE_PROVIDER_SITE_OTHER): Payer: Self-pay | Admitting: Family

## 2018-09-23 NOTE — Telephone Encounter (Signed)
I called father and left a message for him to call back.  I told him I would be available until 5:00 or next week.

## 2018-09-23 NOTE — Telephone Encounter (Signed)
°  Who's calling (name and relationship to patient) : [name] (Father)  Best contact number: [phone] Provider they see: Inetta Fermo  Reason for call: Dad lvm stating that pt had a short seizure last week. Dad would like to speak with Inetta Fermo or Dr. Sharene Skeans. I informed dad that Inetta Fermo is out of the office for a home visit and that Dr. Sharene Skeans would be able to return his call either this evening or on Monday.

## 2018-09-26 NOTE — Telephone Encounter (Addendum)
I called again and told father that I would talk to him when he calls back.  This is the second call that I have made.

## 2018-09-28 NOTE — Telephone Encounter (Signed)
I received this message today. I called and talked to Dad. He said that [name] had a brief seizure last Thursday after staying up very late. He has been compliant with medication. Dad has been trying to get him to bed on time but it is difficult to do. Dad is aware that the sleep deprivation triggers the seizures and is frustrated with Evertte's behavior. He does not want to make changes in medication at this time because when [name] gets enough sleep, he does not have seizures. Dad called to report the seizure because he was instructed to report all seizures. I commended Dad for trying to get [name] to go to sleep earlier at night. I will not make changes in his medication at this time. TG

## 2018-10-19 ENCOUNTER — Telehealth (INDEPENDENT_AMBULATORY_CARE_PROVIDER_SITE_OTHER): Payer: Self-pay | Admitting: Family

## 2018-10-19 DIAGNOSIS — Z79899 Other long term (current) drug therapy: Secondary | ICD-10-CM

## 2018-10-19 DIAGNOSIS — G40309 Generalized idiopathic epilepsy and epileptic syndromes, not intractable, without status epilepticus: Secondary | ICD-10-CM

## 2018-10-19 NOTE — Telephone Encounter (Signed)
I called and talked with Franklin [name]. He said that Franklin [name] went to bed earlier last night but got up quickly because of an appointment, and had the seizure soon after getting up. He was not injured in the seizure. I recommended to Franklin [name] that Franklin [name] have a blood test to check the Lamotrigine level and explained to him how to get that drawn. Franklin [name] agreed with this plan. TG

## 2018-10-19 NOTE — Telephone Encounter (Signed)
Who's calling (name and relationship to patient) : Franklin [name] (dad)  Best contact number: [phone]  Provider they see: Dr. Blane Ohara  Reason for call: Dad called in stating that Franklin [name] had a seizure around 0905 this morning, stated that he could not answer questions, seizure lasted for around a minute. Came out of seizure fine with no issues, and is now sleeping. Dad said Harbert is okay right and no concerns at this moment. Putting in as high priority, stated that he just wanted someone to follow up with him. Please Advise.   Call ID:      PRESCRIPTION REFILL ONLY  Name of prescription:  Pharmacy:

## 2018-10-24 LAB — LAMOTRIGINE LEVEL: Lamotrigine Lvl: 22.3 ug/mL — ABNORMAL HIGH (ref 4.0–18.0)

## 2018-10-24 NOTE — Telephone Encounter (Signed)
I agree with this plan.

## 2018-10-24 NOTE — Telephone Encounter (Signed)
I called and left a message for Dad that the Lamotrigine level was 22.3. I suspect that it was not a trough level. I recommended no changes in his medication dose at this time. TG

## 2018-10-31 ENCOUNTER — Telehealth (INDEPENDENT_AMBULATORY_CARE_PROVIDER_SITE_OTHER): Payer: Self-pay | Admitting: Family

## 2018-10-31 NOTE — Telephone Encounter (Signed)
Who's calling (name and relationship to patient) :  Mendel Ryder, Kroix Goethals (mom)  Best contact number: [phone]  Provider they see: Dr. Blane Ohara  Reason for call:  Dad called in stating that Janette had a seizure yesterday and fell and hit his head, came out of seizure without any issues, just some arm stiffness that is normally associated with his seizures.  Dad stated that Rubin is starting to get them almost weekly, which is a very large increase compared to before when they were once every few months per dad. Has not had one today, stated that Lucah is acting his normal self for the most part now, but somewhat "zoned out". Dad did state that he also had some concerns about behavior and was wondering if medications needed to be adjusted or if any of this behavior is side effects of the medications. Please Advise. Putting in as high priority due to increase seizures daily    Call ID:      PRESCRIPTION REFILL ONLY  Name of prescription:  Pharmacy:

## 2018-11-01 NOTE — Telephone Encounter (Signed)
I called Dad and scheduled an appointment with me for tomorrow at 12N. I will consult with Dr Sharene Skeans when Malikai is in the office. TG

## 2018-11-02 ENCOUNTER — Encounter (INDEPENDENT_AMBULATORY_CARE_PROVIDER_SITE_OTHER): Payer: Self-pay | Admitting: Family

## 2018-11-02 ENCOUNTER — Ambulatory Visit (INDEPENDENT_AMBULATORY_CARE_PROVIDER_SITE_OTHER): Payer: Medicaid Other | Admitting: Family

## 2018-11-02 VITALS — BP 110/80 | HR 80 | Ht 68.5 in | Wt 134.8 lb

## 2018-11-02 DIAGNOSIS — G40309 Generalized idiopathic epilepsy and epileptic syndromes, not intractable, without status epilepticus: Secondary | ICD-10-CM

## 2018-11-02 DIAGNOSIS — Z87898 Personal history of other specified conditions: Secondary | ICD-10-CM

## 2018-11-02 DIAGNOSIS — F819 Developmental disorder of scholastic skills, unspecified: Secondary | ICD-10-CM

## 2018-11-02 MED ORDER — LEVETIRACETAM 500 MG PO TABS: ORAL_TABLET | ORAL | 5 refills | Status: DC

## 2018-11-02 NOTE — Patient Instructions (Signed)
Thank you for coming in today.   Instructions for you until your next appointment are as follows: 1. Start Levetiracetam (Keppra) 500mg  - take 1/2 tablet in the morning and 1/2 tablet at night for 1 week, then take 1 tablet in the morning and 1 tablet at night after that. This is available at [facility] for $9 per month.  2. Continue taking the Lamictal as you have been doing.  3. Remember that it is important to get at least 8-9 hours of sleep each night 4. Please continue to let me know when you have a seizure. 5. Please plan to return for follow up in 3 months or sooner if needed 6. We may consider doing an EEG in the future if it is affordable for you. I will check into that and let you know.

## 2018-11-02 NOTE — Progress Notes (Signed)
Patient: Franklin [name] MRN: [id number] Sex: male DOB: 28-Mar-1996  Provider: Elveria Rising, NP Location of Care: [facility]  Note type: Routine return visit  History of Present Illness: Referral Source: [facility] History from: father, patient and [facility] chart Chief Complaint: Seizures  Franklin [name] is a 23 y.o. young man with history of generalized convulsive epilepsy. He was last seen March 07, 2018. Franklin [name] also has history of prematurity and intellectual disability. He is taking and tolerating brand Lamictal that he receives from [employer] patient assistance. Franklin [name] is seen today because he has had increasing seizure frequency since his last visit. Franklin [name] had seizures on April 08, 2018, May 28, 2018, May 30, 2018, June 27, 2018, July 22, 2018, September 14, 2018, September 23, 2018, October 18, 2018, and October 31, 2018. In the past Franklin [name]'s seizures were often related to missed medication or sleep deprivation. With the recent seizures, he has been compliant with medication and has been getting better sleep. He has fallen with some of the seizures and received treatment in the ER for minor injuries. A Lamotrigine level was obtained on October 21, 2018 and was 22.7mcg/ml. Franklin [name] has not had an EEG performed since 2012 because of lack of insurance coverage.   Franklin [name] has been unable to find employment and spends his days playing video games and walking to [employer]. There are no problems with behavior. Franklin [name] has been otherwise healthy since he was last seen. Neither he nor his father have other health concerns for him  today other than previously mentioned.  Review of Systems: Please see the HPI for neurologic and other pertinent review of systems. Otherwise, all other systems were reviewed and were negative.    Past Medical History:  Diagnosis Date  . Prematurity   . Retinopathy of prematurity   . Seizures (HCC)     Hospitalizations: No., Head Injury: No., Nervous System Infections: No., Immunizations up to date: Yes.   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 He was tried on Concerta which caused significant emotional disturbance, then on Ritalin, then Adderall which worked well. He has mild hearing loss.  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   Birth History: He was born at [redacted] weeks gestation, weighing 1 lb 13 1/2 oz. He required ventilator support for sometime. He reportedly had no CNS hemorrhage. Growth and development was delayed.    Surgical History Past Surgical History:  Procedure Laterality Date  . Central line placement     As premature infant  . REFRACTIVE SURGERY     For retinopathy of prematurity    Family History family history includes Autism in his brother; Diabetes type II in his father; Hypercholesterolemia in his father; Hypertension in his father; Stroke in his paternal aunt and paternal grandmother. Family History is otherwise negative for migraines, seizures, cognitive impairment, blindness, deafness, birth defects, chromosomal disorder, autism.  Social History Social History   Socioeconomic History  . Marital status: Single    Spouse name: Not on file  . Number of children: Not on file  . Years of education: Not on file  . Highest education level: Not on file  Occupational History  . Not on file  Social Needs  . Financial resource strain: Not on file  . Food insecurity:    Worry: Not on file    Inability: Not on file  . Transportation needs:    Medical: Not on file    Non-medical: Not on file  Tobacco Use  . Smoking status:  Never Smoker  . Smokeless tobacco: Never Used  Substance and Sexual Activity  . Alcohol use: No  . Drug use: No  . Sexual activity: Never  Lifestyle  . Physical activity:    Days per week: Not on file    Minutes per session: Not on file  . Stress: Not on file  Relationships  . Social connections:    Talks on phone: Not on file    Gets together: Not on file    Attends religious service: Not on file    Active member of club or organization: Not on file    Attends meetings of clubs or organizations: Not on file    Relationship status: Not on file  Other Topics Concern  . Not on file  Social History Narrative   Parents are divorced. Franklin [name] lives with his father.    Allergies Allergies  Allergen Reactions  . Concerta [Methylphenidate] Other (See Comments)    Would start crying for little things like when someone coughed or said a specific word for example.     Physical Exam BP 110/80   Pulse 80   Ht 5' 8.5" (1.74 m)   Wt 134 lb 12.8 oz (61.1 kg)   BMI 20.20 kg/m  General: thin young man, seated on exam table, in no evident distress; brown hair, brown eyes, right handed Head: normocephalic and atraumatic. Oropharynx benign. No dysmorphic features. Neck: supple with no carotid bruits. No focal tenderness. Cardiovascular: regular rate and rhythm, no murmurs. Respiratory: Clear to auscultation bilaterally Abdomen: Bowel sounds present all four quadrants, abdomen soft, non-tender, non-distended.  Musculoskeletal: No skeletal deformities or obvious scoliosis Skin: mild facial acne. He has reddish lesion on right parietal scalp  Neurologic Exam Mental Status: Awake and fully alert.  Attention span, concentration, and fund of knowledge subnormal for age.  Speech fluent without dysarthria.  Able to follow commands and participate in examination. Cranial Nerves: Fundoscopic exam - red reflex present.  Unable to fully visualize fundus.  Pupils equal briskly reactive to light.   Extraocular movements full without nystagmus.  Visual fields full to confrontation.  Hearing intact and symmetric to whisper.  Facial sensation intact.  Face, tongue, palate move normally and symmetrically.  Neck flexion and extension normal. Motor: Normal bulk and tone.  Normal strength in all tested extremity muscles. Sensory: Intact to touch and temperature in all extremities. Coordination: Rapid movements: finger and toe tapping normal and symmetric bilaterally.  Finger-to-nose and heel-to-shin intact bilaterally.  Able to balance on either foot. Romberg negative. Gait and Station: Arises from chair, without difficulty. Stance is normal.  Gait demonstrates normal stride length and balance. Able to walk normally. Able to heel, toe and tandem walk without difficulty. Reflexes: Diminished and symmetric. Toes downgoing. No clonus.   Impression 1. Generalized convulsive epilepsy 2.  Intellectual delay 3.  History of prematurity   Recommendations for plan of care The patient's previous [facility] records were reviewed. Franklin [name] has neither had nor required imaging studies since the last visit. He had lab studies and he and his father are aware of the results. Franklin [name] is taking and tolerating brand Lamictal but  has experienced increase in seizure frequency. I talked with Franklin [name] and his father about adding a medication to try to gain seizure control. Because he is uninsured options are limited, but after discussion Dad decided to try Levetiracetam which is available from [facility] for $9. If the finds this is not affordable, I will look into other options from a patient assistance program. We talked about performing an EEG but decided to wait to see if the medication gave him seizure control because of cost of testing. Dad asked about other treatment options, such as surgical removal of the seizure focus and I reviewed the process for evaluating that. I also talked with Dad about Vagal Nerve Stimulator. Both of  these options are prohibitive because of lack of insurance coverage.   I asked Dad to let me know if Franklin [name] has any difficulty with tolerance to Levetiracetam and if he has more seizures. If the Levetiracetam proves to be more effective than Lamictal, we may consider slowly tapering Lamictal in the future. If Levetiracetam is not tolerated or ineffective, the next treatment option will be Depakote.   Franklin [name] and his father agreed with the plans made today. I will see him back in follow up in 3 months or sooner if needed.   The medication list was reviewed and reconciled. I reviewed changes that were made in the prescribed medications today.  A complete medication list was provided to the patient.  Allergies as of 11/02/2018      Reactions   Concerta [methylphenidate] Other (See Comments)   Would start crying for little things like when someone coughed or said a specific word for example.       Medication List       Accurate as of November 02, 2018 11:59 PM. Always use your most recent med list.        acetaminophen 500 MG tablet Commonly known as:  TYLENOL Take 1,000 mg by mouth every 6 (six) hours as needed for mild pain.   LAMICTAL XR 100 MG Tb24 24 hour tablet Generic drug:  LamoTRIgine Take 1 tablet every morning along with Lamictal XR 250mg    LAMICTAL XR 200 MG Tb24 24 hour tablet Generic drug:  LamoTRIgine Take 1 tablet at night along with a Lamictal XR 300mg  tablet   LAMICTAL XR 250 MG Tb24 24 hour tablet Generic drug:  LamoTRIgine Take 1 tablet in the morning along with Lamictal XR 100mg    LAMICTAL XR 300 MG Tb24 24 hour tablet Generic drug:  LamoTRIgine Take 1 tablet at night along with Lamictal XR 200mg    levETIRAcetam 500 MG tablet Commonly known as:  KEPPRA Take 1/2 tablet twice per day for 1 week, then take 1 tablet twice per day       Dr. Sharene Skeans was consulted regarding the patient.   Total time spent with the patient was 30 minutes, of which 50% or more  was spent in counseling and coordination of care.   Elveria Rising NP-C

## 2018-11-04 ENCOUNTER — Encounter (INDEPENDENT_AMBULATORY_CARE_PROVIDER_SITE_OTHER): Payer: Self-pay | Admitting: Family

## 2019-01-06 ENCOUNTER — Telehealth (INDEPENDENT_AMBULATORY_CARE_PROVIDER_SITE_OTHER): Payer: Self-pay | Admitting: Pediatrics

## 2019-01-06 DIAGNOSIS — G40309 Generalized idiopathic epilepsy and epileptic syndromes, not intractable, without status epilepticus: Secondary | ICD-10-CM

## 2019-01-06 MED ORDER — LEVETIRACETAM 500 MG PO TABS: ORAL_TABLET | ORAL | 5 refills | Status: DC

## 2019-01-06 NOTE — Telephone Encounter (Signed)
Patient had a breakthrough seizure- was his typical seizure semiology and lasted about 2 minutes.  Previously occurring about once weekly, but it's been about a month since his last seizure.  Last night, he was up until 3am playing video games. Denies missing any doses, not sick.  Now back to baseline.   Verified he is taking Lamictal, dad unsure of dosing because he does it himself and has multiple pills. For Keppra, he is taking 1 tablet twice daily.    He has woken up several times with bad headache, which often happens with seizure, but unsure if he's had a seizure.Otherwise no other events concerning for seizure.    Advised that it is important to keep a consistent sleep schedule, this may have contributed to breakthrough seizure.  With improvement in seizure frequency and his rapid return to baseline I don't recommend any other intervention right now, but if he continues to have seizures to call back to increase Keppra.   I let them know I would forward this to [facility] and if she had any other advise, she will call them directly. I changed prescription to 1 tablet twice daily, as last one was for the uptitration.     Lorenz Coaster MD MPH

## 2019-01-06 NOTE — Telephone Encounter (Signed)
Thanks I will forward to  Derry.

## 2019-01-09 NOTE — Telephone Encounter (Signed)
Noted. The lack of sleep has been an ongoing problem. TG

## 2019-03-01 ENCOUNTER — Other Ambulatory Visit (INDEPENDENT_AMBULATORY_CARE_PROVIDER_SITE_OTHER): Payer: Self-pay | Admitting: Family

## 2019-03-01 DIAGNOSIS — G40309 Generalized idiopathic epilepsy and epileptic syndromes, not intractable, without status epilepticus: Secondary | ICD-10-CM

## 2019-03-01 MED ORDER — LAMICTAL XR 200 MG PO TB24: ORAL_TABLET | ORAL | 3 refills | Status: DC

## 2019-03-01 MED ORDER — LAMICTAL XR 100 MG PO TB24: ORAL_TABLET | ORAL | 3 refills | Status: DC

## 2019-03-01 MED ORDER — LAMICTAL XR 250 MG PO TB24: ORAL_TABLET | ORAL | 3 refills | Status: DC

## 2019-03-01 MED ORDER — LAMICTAL XR 300 MG PO TB24: ORAL_TABLET | ORAL | 3 refills | Status: DC

## 2019-03-06 ENCOUNTER — Emergency Department (HOSPITAL_COMMUNITY): Payer: Self-pay

## 2019-03-06 ENCOUNTER — Emergency Department (HOSPITAL_COMMUNITY)
Admission: EM | Admit: 2019-03-06 | Discharge: 2019-03-06 | Disposition: A | Discharge status: Home / Self Care | Payer: Self-pay | Attending: Emergency Medicine | Admitting: Emergency Medicine

## 2019-03-06 ENCOUNTER — Other Ambulatory Visit: Payer: Self-pay

## 2019-03-06 ENCOUNTER — Encounter (HOSPITAL_COMMUNITY): Payer: Self-pay | Admitting: Emergency Medicine

## 2019-03-06 DIAGNOSIS — Z79899 Other long term (current) drug therapy: Secondary | ICD-10-CM | POA: Insufficient documentation

## 2019-03-06 DIAGNOSIS — J189 Pneumonia, unspecified organism: Secondary | ICD-10-CM | POA: Insufficient documentation

## 2019-03-06 DIAGNOSIS — Z888 Allergy status to other drugs, medicaments and biological substances status: Secondary | ICD-10-CM | POA: Insufficient documentation

## 2019-03-06 DIAGNOSIS — Z20828 Contact with and (suspected) exposure to other viral communicable diseases: Secondary | ICD-10-CM | POA: Insufficient documentation

## 2019-03-06 DIAGNOSIS — G40309 Generalized idiopathic epilepsy and epileptic syndromes, not intractable, without status epilepticus: Secondary | ICD-10-CM | POA: Insufficient documentation

## 2019-03-06 LAB — COMPREHENSIVE METABOLIC PANEL
ALT: 31 U/L (ref 0–44)
AST: 35 U/L (ref 15–41)
Albumin: 5.3 g/dL — ABNORMAL HIGH (ref 3.5–5.0)
Alkaline Phosphatase: 103 U/L (ref 38–126)
Anion gap: 18 — ABNORMAL HIGH (ref 5–15)
BUN: 11 mg/dL (ref 6–20)
CO2: 21 mmol/L — ABNORMAL LOW (ref 22–32)
Calcium: 9.3 mg/dL (ref 8.9–10.3)
Chloride: 100 mmol/L (ref 98–111)
Creatinine, Ser: 1.16 mg/dL (ref 0.61–1.24)
GFR calc Af Amer: >60 mL/min (ref >60)
GFR calc non Af Amer: >60 mL/min (ref >60)
Glucose, Bld: 122 mg/dL — ABNORMAL HIGH (ref 70–99)
Potassium: 3.2 mmol/L — ABNORMAL LOW (ref 3.5–5.1)
Sodium: 139 mmol/L (ref 135–145)
Total Bilirubin: 1 mg/dL (ref 0.3–1.2)
Total Protein: 8.3 g/dL — ABNORMAL HIGH (ref 6.5–8.1)

## 2019-03-06 LAB — I-STAT CHEM 8, ED
BUN: 8 mg/dL (ref 6–20)
Calcium, Ion: 1.17 mmol/L (ref 1.15–1.40)
Chloride: 100 mmol/L (ref 98–111)
Creatinine, Ser: 0.9 mg/dL (ref 0.61–1.24)
Glucose, Bld: 122 mg/dL — ABNORMAL HIGH (ref 70–99)
HCT: 52 % (ref 39.0–52.0)
Hemoglobin: 17.7 g/dL — ABNORMAL HIGH (ref 13.0–17.0)
Potassium: 3.2 mmol/L — ABNORMAL LOW (ref 3.5–5.1)
Sodium: 140 mmol/L (ref 135–145)
TCO2: 21 mmol/L — ABNORMAL LOW (ref 22–32)

## 2019-03-06 LAB — RAPID URINE DRUG SCREEN, HOSP PERFORMED
Amphetamines: NOT DETECTED
Barbiturates: NOT DETECTED
Benzodiazepines: NOT DETECTED
Cocaine: NOT DETECTED
Opiates: NOT DETECTED
Tetrahydrocannabinol: NOT DETECTED

## 2019-03-06 LAB — LACTIC ACID, PLASMA
Lactic Acid, Venous: 4.6 mmol/L (ref 0.5–1.9)
Lactic Acid, Venous: 1.4 mmol/L (ref 0.5–1.9)

## 2019-03-06 LAB — CBC WITH DIFFERENTIAL/PLATELET
Abs Immature Granulocytes: 0.13 10*3/uL — ABNORMAL HIGH (ref 0.00–0.07)
Basophils Absolute: 0.1 10*3/uL (ref 0.0–0.1)
Basophils Relative: 0 %
Eosinophils Absolute: 0.1 10*3/uL (ref 0.0–0.5)
Eosinophils Relative: 0 %
HCT: 51.1 % (ref 39.0–52.0)
Hemoglobin: 16.4 g/dL (ref 13.0–17.0)
Immature Granulocytes: 1 %
Lymphocytes Relative: 9 %
Lymphs Abs: 1.9 10*3/uL (ref 0.7–4.0)
MCH: 31.2 pg (ref 26.0–34.0)
MCHC: 32.1 g/dL (ref 30.0–36.0)
MCV: 97.1 fL (ref 80.0–100.0)
Monocytes Absolute: 1.9 10*3/uL — ABNORMAL HIGH (ref 0.1–1.0)
Monocytes Relative: 9 %
Neutro Abs: 16.7 10*3/uL — ABNORMAL HIGH (ref 1.7–7.7)
Neutrophils Relative %: 81 %
Platelets: 348 10*3/uL (ref 150–400)
RBC: 5.26 MIL/uL (ref 4.22–5.81)
RDW: 12.5 % (ref 11.5–15.5)
WBC: 20.7 10*3/uL — ABNORMAL HIGH (ref 4.0–10.5)
nRBC: 0 % (ref 0.0–0.2)

## 2019-03-06 LAB — URINALYSIS, ROUTINE W REFLEX MICROSCOPIC
Bilirubin Urine: NEGATIVE
Glucose, UA: NEGATIVE mg/dL
Hgb urine dipstick: NEGATIVE
Ketones, ur: NEGATIVE mg/dL
Leukocytes,Ua: NEGATIVE
Nitrite: NEGATIVE
Protein, ur: NEGATIVE mg/dL
Specific Gravity, Urine: 1.014 (ref 1.005–1.030)
pH: 7 (ref 5.0–8.0)

## 2019-03-06 LAB — SARS CORONAVIRUS 2 BY RT PCR (HOSPITAL ORDER, PERFORMED IN ~~LOC~~ HOSPITAL LAB): SARS Coronavirus 2: NEGATIVE

## 2019-03-06 LAB — ETHANOL: Alcohol, Ethyl (B): <10 mg/dL (ref <10)

## 2019-03-06 LAB — CBG MONITORING, ED: Glucose-Capillary: 111 mg/dL — ABNORMAL HIGH (ref 70–99)

## 2019-03-06 MED ORDER — ALBUTEROL SULFATE HFA 108 (90 BASE) MCG/ACT IN AERS
2.0000 | INHALATION_SPRAY | Freq: Once | RESPIRATORY_TRACT | Status: AC
  Administered 2019-03-06: 2 via RESPIRATORY_TRACT
  Filled 2019-03-06: qty 6.7

## 2019-03-06 MED ORDER — SODIUM CHLORIDE 0.9 % IV SOLN
2.0000 g | Freq: Once | INTRAVENOUS | Status: AC
  Administered 2019-03-06: 2 g via INTRAVENOUS
  Filled 2019-03-06: qty 2

## 2019-03-06 MED ORDER — POTASSIUM CHLORIDE CRYS ER 20 MEQ PO TBCR
40.0000 meq | EXTENDED_RELEASE_TABLET | Freq: Once | ORAL | Status: AC
  Administered 2019-03-06: 21:00:00 40 meq via ORAL
  Filled 2019-03-06: qty 2

## 2019-03-06 MED ORDER — VANCOMYCIN HCL IN DEXTROSE 1-5 GM/200ML-% IV SOLN
1000.0000 mg | Freq: Once | INTRAVENOUS | Status: AC
  Administered 2019-03-06: 20:00:00 1000 mg via INTRAVENOUS
  Filled 2019-03-06: qty 200

## 2019-03-06 MED ORDER — LEVETIRACETAM IN NACL 1000 MG/100ML IV SOLN
1000.0000 mg | Freq: Once | INTRAVENOUS | Status: AC
  Administered 2019-03-06: 1000 mg via INTRAVENOUS
  Filled 2019-03-06: qty 100

## 2019-03-06 MED ORDER — AEROCHAMBER Z-STAT PLUS/MEDIUM MISC
1.0000 | Freq: Once | Status: AC
  Administered 2019-03-06: 1

## 2019-03-06 MED ORDER — SODIUM CHLORIDE 0.9 % IV BOLUS (SEPSIS)
1000.0000 mL | Freq: Once | INTRAVENOUS | Status: AC
  Administered 2019-03-06: 1000 mL via INTRAVENOUS

## 2019-03-06 MED ORDER — AEROCHAMBER Z-STAT PLUS/MEDIUM MISC
Status: AC
  Administered 2019-03-06: 22:00:00
  Filled 2019-03-06: qty 1

## 2019-03-06 MED ORDER — METRONIDAZOLE IN NACL 5-0.79 MG/ML-% IV SOLN
500.0000 mg | Freq: Once | INTRAVENOUS | Status: AC
  Administered 2019-03-06: 20:00:00 500 mg via INTRAVENOUS
  Filled 2019-03-06: qty 100

## 2019-03-06 MED ORDER — AZITHROMYCIN 250 MG PO TABS: 250.0000 mg | ORAL_TABLET | Freq: Every day | ORAL | 0 refills | Status: DC

## 2019-03-06 MED ORDER — ACETAMINOPHEN 325 MG PO TABS
650.0000 mg | ORAL_TABLET | Freq: Once | ORAL | Status: AC
  Administered 2019-03-06: 650 mg via ORAL
  Filled 2019-03-06: qty 2

## 2019-03-06 MED ORDER — ALBUTEROL SULFATE HFA 108 (90 BASE) MCG/ACT IN AERS: 2.0000 | INHALATION_SPRAY | Freq: Four times a day (QID) | RESPIRATORY_TRACT | 0 refills | Status: DC | PRN

## 2019-03-06 MED ORDER — ONDANSETRON HCL 4 MG/2ML IJ SOLN
4.0000 mg | Freq: Once | INTRAMUSCULAR | Status: AC
  Administered 2019-03-06: 4 mg via INTRAVENOUS
  Filled 2019-03-06: qty 2

## 2019-03-06 NOTE — ED Provider Notes (Signed)
[facility]-EMERGENCY DEPT Provider Note   CSN: [id number] Arrival date & time: 03/06/19  1805    History   Chief Complaint Chief Complaint  Patient presents with  . Fever  . Cough    HPI Franklin [name] is a 23 y.o. male.     HPI   Franklin [name] is a 23 y.o. male, with a history of seizures, presenting to the ED with fever beginning this morning.  Also notes mild cough and intermittent shortness of breath. Denies alcohol or illicit drug use.  He states he has been taking his seizure medications, as prescribed.  His last doses were this morning. Denies N/V/D, chest pain, rash, neck pain/stiffness, urinary symptoms, abdominal pain, or any other complaints. Patient denies contact with known or suspected COVID-19 patients. The above information was obtained after patient's postictal period resolved.  I was called to assess this patient by one of the RNs.  He reportedly had a seizure while being triaged.  He had full body, clonic tonic movement lasting approximately 1 minute.  Nursing staff state patient did not fall and they made attempts to protect him from injury. Upon my initial assessment, patient appears postictal.  He will look around, but does not respond.  Level 5 caveat applied during initial assessment.      Past Medical History:  Diagnosis Date  . Prematurity   . Retinopathy of prematurity   . Seizures [facility](HCC)     Patient Active Problem List   Diagnosis Date Noted  . Intellectual delay 03/10/2018  . Personal history of prematurity 03/10/2018  . 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 Long-term use of high-risk medication 06/14/2013    Past Surgical History:  Procedure Laterality Date  . Central line placement     As premature infant  . REFRACTIVE SURGERY     For retinopathy of prematurity        Home Medications    Prior to Admission medications   Medication Sig Start Date End Date Taking? Authorizing Provider   LAMICTAL XR 100 MG TB24 24 hour tablet Take 1 tablet every morning along with Lamictal XR 250mg  03/01/19  Yes Goodpasture, [name], NP  LAMICTAL XR 200 MG TB24 24 hour tablet Take 1 tablet at night along with a Lamictal XR 300mg  tablet 03/01/19  Yes Goodpasture, [name], NP  LAMICTAL XR 250 MG TB24 24 hour tablet Take 1 tablet in the morning along with Lamictal XR 100mg  03/01/19  Yes Elveria RisingGoodpasture, Tina, NP  LAMICTAL XR 300 MG TB24 24 hour tablet Take 1 tablet at night along with Lamictal XR 200mg  03/01/19  Yes Elveria RisingGoodpasture, Tina, NP  levETIRAcetam (KEPPRA) 500 MG tablet Take 1 tablet twice per day 01/06/19  Yes Lorenz CoasterWolfe, Stephanie, MD  albuterol (VENTOLIN HFA) 108 (90 Base) MCG/ACT inhaler Inhale 2 puffs into the lungs every 6 (six) hours as needed for wheezing or shortness of breath. 03/06/19   Khoa Opdahl C, PA-C  azithromycin (ZITHROMAX) 250 MG tablet Take 1 tablet (250 mg total) by mouth daily. Take first 2 tablets together, then 1 every day until finished. 03/06/19   Jaylon Boylen, Hillard DankerShawn C, PA-C    Family History Family History  Problem Relation Age of Onset  . Diabetes type II Father   . Hypercholesterolemia Father   . Hypertension Father   . Stroke Paternal Grandmother   . Autism Brother        Also has cognitive delays  . Stroke Paternal Aunt     Social History  Social History   Tobacco Use  . Smoking status: Never Smoker  . Smokeless tobacco: Never Used  Substance Use Topics  . Alcohol use: No  . Drug use: No     Allergies   Concerta [methylphenidate]   Review of Systems Review of Systems  Constitutional: Positive for fever.  Respiratory: Positive for cough and shortness of breath.   Cardiovascular: Negative for chest pain and leg swelling.  Gastrointestinal: Negative for abdominal pain, diarrhea, nausea and vomiting.  Genitourinary: Negative for dysuria, flank pain and hematuria.  Musculoskeletal: Negative for back pain.  Neurological: Positive for seizures. Negative for dizziness, syncope,  weakness, light-headedness and headaches.  All other systems reviewed and are negative.    Physical Exam Updated Vital Signs BP 110/88 (BP Location: Left Arm)   Pulse (!) 119   Temp 99.9 F (37.7 C) (Oral)   Resp 18   SpO2 99%   Physical Exam Vitals signs and nursing note reviewed.  Constitutional:      General: He is not in acute distress.    Appearance: He is well-developed. He is not diaphoretic.  HENT:     Head: Normocephalic and atraumatic.     Nose: Nose normal.     Mouth/Throat:     Mouth: Mucous membranes are moist.     Pharynx: Oropharynx is clear.  Eyes:     Conjunctiva/sclera: Conjunctivae normal.  Neck:     Musculoskeletal: Neck supple.  Cardiovascular:     Rate and Rhythm: Regular rhythm. Tachycardia present.     Pulses: Normal pulses.          Radial pulses are 2+ on the right side and 2+ on the left side.       Posterior tibial pulses are 2+ on the right side and 2+ on the left side.     Heart sounds: Normal heart sounds.     Comments: Tactile temperature in the extremities appropriate and equal bilaterally. Pulmonary:     Effort: Pulmonary effort is normal. No respiratory distress.     Breath sounds: Normal breath sounds.  Abdominal:     Palpations: Abdomen is soft.     Tenderness: There is no abdominal tenderness. There is no guarding.  Musculoskeletal:     Right lower leg: No edema.     Left lower leg: No edema.     Comments: Normal motor function intact in all extremities. No midline spinal tenderness.   Overall trauma exam performed without any abnormalities noted other than those mentioned.  Lymphadenopathy:     Cervical: No cervical adenopathy.  Skin:    General: Skin is warm and dry.  Neurological:     Mental Status: He is alert.     Comments: Initially, patient appeared to be postictal.  Eye-opening spontaneously, but would not respond verbally. He would follow some simple commands, but slowly. Once he began to respond verbally and within  a few minutes, he was confused.  This postictal period completely resolved within about 30 minutes.  Neurologic exam below was performed after postictal period cleared: Alert and oriented x4.  Sensation grossly intact to light touch in the extremities.  Grip strengths equal bilaterally.  Strength 5/5 in all extremities. No gait disturbance. Coordination intact. Cranial nerves III-XII grossly intact. No facial droop.   Psychiatric:        Mood and Affect: Mood and affect normal.        Speech: Speech normal.        Behavior: Behavior normal.  ED Treatments / Results  Labs (all labs ordered are listed, but only abnormal results are displayed) Labs Reviewed  COMPREHENSIVE METABOLIC PANEL - Abnormal; Notable for the following components:      Result Value   Potassium 3.2 (*)    CO2 21 (*)    Glucose, Bld 122 (*)    Total Protein 8.3 (*)    Albumin 5.3 (*)    Anion gap 18 (*)    All other components within normal limits  CBC WITH DIFFERENTIAL/PLATELET - Abnormal; Notable for the following components:   WBC 20.7 (*)    Neutro Abs 16.7 (*)    Monocytes Absolute 1.9 (*)    Abs Immature Granulocytes 0.13 (*)    All other components within normal limits  LACTIC ACID, PLASMA - Abnormal; Notable for the following components:   Lactic Acid, Venous 4.6 (*)    All other components within normal limits  I-STAT CHEM 8, ED - Abnormal; Notable for the following components:   Potassium 3.2 (*)    Glucose, Bld 122 (*)    TCO2 21 (*)    Hemoglobin 17.7 (*)    All other components within normal limits  CBG MONITORING, ED - Abnormal; Notable for the following components:   Glucose-Capillary 111 (*)    All other components within normal limits  SARS CORONAVIRUS 2 (HOSPITAL ORDER, [place] [facility])  CULTURE, BLOOD (ROUTINE X 2)  CULTURE, BLOOD (ROUTINE X 2)  ETHANOL  LACTIC ACID, PLASMA  RAPID URINE DRUG SCREEN, HOSP PERFORMED  URINALYSIS, ROUTINE W REFLEX MICROSCOPIC   LEVETIRACETAM LEVEL  LAMOTRIGINE LEVEL    EKG None  Radiology Ct Head Wo Contrast  Result Date: 03/06/2019 CLINICAL DATA:  Seizure activity. EXAM: CT HEAD WITHOUT CONTRAST TECHNIQUE: Contiguous axial images were obtained from the base of the skull through the vertex without intravenous contrast. COMPARISON:  CT dated February 13, 2019. FINDINGS: Brain: No evidence of acute infarction, hemorrhage, hydrocephalus, extra-axial collection or mass lesion/mass effect. Again noted is volume loss greater than expected for the patient's age. Vascular: No hyperdense vessel or unexpected calcification. Skull: Normal. Negative for fracture or focal lesion. Sinuses/Orbits: No acute finding. Other: None. IMPRESSION: No acute intracranial abnormality. Electronically Signed   By: Constance Holster M.D.   On: 03/06/2019 19:18   Dg Chest Portable 1 View  Result Date: 03/06/2019 CLINICAL DATA:  Cough and fever. EXAM: PORTABLE CHEST 1 VIEW COMPARISON:  None. FINDINGS: There are mild hazy airspace opacities bilaterally. The heart size is normal. There is no acute osseous abnormality. No pneumothorax. There is no large pleural effusion. IMPRESSION: Scattered mild hazy airspace opacities bilaterally of unknown clinical significance. This could represent atelectasis or a developing atypical infectious process. Electronically Signed   By: Constance Holster M.D.   On: 03/06/2019 19:05    Procedures Procedures (including critical care time)  Medications Ordered in ED Medications  sodium chloride 0.9 % bolus 1,000 mL (0 mLs Intravenous Stopped 03/06/19 1929)    And  sodium chloride 0.9 % bolus 1,000 mL (0 mLs Intravenous Stopped 03/06/19 2021)  ceFEPIme (MAXIPIME) 2 g in sodium chloride 0.9 % 100 mL IVPB (0 g Intravenous Stopped 03/06/19 2011)  metroNIDAZOLE (FLAGYL) IVPB 500 mg (0 mg Intravenous Stopped 03/06/19 2045)  vancomycin (VANCOCIN) IVPB 1000 mg/200 mL premix (0 mg Intravenous Stopped 03/06/19 2127)  levETIRAcetam  (KEPPRA) IVPB 1000 mg/100 mL premix (0 mg Intravenous Stopped 03/06/19 1929)  acetaminophen (TYLENOL) tablet 650 mg (650 mg Oral Given 03/06/19  2048)  potassium chloride SA (K-DUR) CR tablet 40 mEq (40 mEq Oral Given 03/06/19 2126)  ondansetron (ZOFRAN) injection 4 mg (4 mg Intravenous Given 03/06/19 2148)  albuterol (VENTOLIN HFA) 108 (90 Base) MCG/ACT inhaler 2 puff (2 puffs Inhalation Provided for home use 03/06/19 2202)  aerochamber Z-Stat Plus/medium 1 each (1 each Other Provided for home use 03/06/19 2203)  aerochamber Z-Stat Plus/medium (  Given 03/06/19 2226)     Initial Impression / Assessment and Plan / ED Course  I have reviewed the triage vital signs and the nursing notes.  Pertinent labs & imaging results that were available during my care of the patient were reviewed by me and considered in my medical decision making (see chart for details).  Clinical Course as of Mar 06 34  Mon Mar 06, 2019  1823 RN stopped me in the hallway and asked me to see this patient.  He reportedly had a seizure during the triage process.    [SJ]  2147 Spoke with patient's father, Mendel RyderHarvey Lubbers, with patient's permission.  I discussed the patient's presentation, vital signs, and lab/imaging results.  Answered all questions. He states he suspects patient has been out of his Lamictal for the past week.  He picked up refills with the patient today.   [SJ]  2215 At the patient's request I spoke with patient's mother, Sharyl NimrodMeredith, on the phone. We discussed findings and results. All questions were answered.    [SJ]    Clinical Course User Index [SJ] Sanika Brosious C, PA-C        The patient is noted to have a lactate>4. With the current information available to me, I don't think the patient is in septic shock. The lactate>4, is related to seizure activity.  Patient presents with original complaint of fever, mild cough, and intermittent shortness of breath.  He had a seizure prior to my first contact with him.  Since I was unable to accurately get information from the patient during my initial assessment, I initiated sepsis work-up based on his fever.  He does have some mild, scattered opacities on the chest x-ray which may indicate atypical pneumonia.  COVID-19 negative. Initially was found to be tachycardic.  Also had leukocytosis and lactic acidosis.  However, he is nontoxic-appearing and without hypotension.  His clinical picture is complicated by the fact that he had a seizure prior to my contact with him.  I suspect this probably increased his WBC count and lactic acid.  Considering all information and findings my suspicion for sepsis in this patient is quite low. Similarly, a CNS source of infection was considered, however, thought much less likely based on duration of symptoms, physical exam findings, and patient's complaint.  There is some suspicion by the patient's father that he has not been compliant with his Lamictal for the past week, which of course could have provoked the seizure.  Alternatively, his febrile illness could have also lowered his seizure threshold.  Patient felt "back to normal" prior to discharge.  He ambulated prior to discharge without noted difficulty, onset of shortness of breath, and while maintaining adequate SPO2 on room air. The patient was given instructions for home care as well as return precautions. Patient voices understanding of these instructions, accepts the plan, and is comfortable with discharge.  Findings and plan of care discussed with Bethann BerkshireJoseph Zammit, MD. Dr. Estell HarpinZammit personally evaluated and examined this patient.  Vitals:   03/06/19 2040 03/06/19 2100 03/06/19 2130 03/06/19 2200  BP: 114/72 113/74 115/84  111/74  Pulse: (!) 104 95 96 85  Resp: Temp:      TempSrc:      SpO2: 96% 96% 100% 100%       Final Clinical Impressions(s) / ED Diagnoses   Final diagnoses:  Atypical pneumonia    ED Discharge Orders         Ordered     azithromycin (ZITHROMAX) 250 MG tablet  Daily     03/06/19 2156    albuterol (VENTOLIN HFA) 108 (90 Base) MCG/ACT inhaler  Every 6 hours PRN     03/06/19 2156           Anselm Pancoast, PA-C 03/07/19 0046    Bethann Berkshire, MD 03/07/19 1240

## 2019-03-06 NOTE — ED Notes (Signed)
Pt given water and sandwich per orders

## 2019-03-06 NOTE — ED Notes (Signed)
Urine and culture sent to lab  

## 2019-03-06 NOTE — ED Notes (Addendum)
Pt aware that urine sample is needed.  

## 2019-03-06 NOTE — Discharge Instructions (Addendum)
There was evidence of possible pneumonia on the chest x-ray. Please take all of your antibiotics until finished!   You may develop abdominal discomfort or diarrhea from the antibiotic.  You may help offset this with probiotics which you can buy or get in yogurt. Do not eat or take the probiotics until 2 hours after your antibiotic.   Hand washing: Wash your hands throughout the day, but especially before and after touching the face, using the restroom, sneezing, coughing, or touching surfaces that have been coughed or sneezed upon. Hydration: Symptoms of most illnesses will be intensified and complicated by dehydration. Dehydration can also extend the duration of symptoms. Drink plenty of fluids and get plenty of rest. You should be drinking at least half a liter of water an hour to stay hydrated. Electrolyte drinks (ex. Gatorade, Powerade, Pedialyte) are also encouraged. You should be drinking enough fluids to make your urine light yellow, almost clear. If this is not the case, you are not drinking enough water. Please note that some of the treatments indicated below will not be effective if you are not adequately hydrated. Pain or fever: Ibuprofen, Naproxen, or acetaminophen (generic for Tylenol) for pain or fever.  Antiinflammatory medications: Take 600 mg of ibuprofen every 6 hours or 440 mg (over the counter dose) to 500 mg (prescription dose) of naproxen every 12 hours for the next 3 days. After this time, these medications may be used as needed for pain. Take these medications with food to avoid upset stomach. Choose only one of these medications, do not take them together. Acetaminophen (generic for Tylenol): Should you continue to have additional pain while taking the ibuprofen or naproxen, you may add in acetaminophen as needed. Your daily total maximum amount of acetaminophen from all sources should be limited to 4000mg /day for persons without liver problems, or 2000mg /day for those with liver  problems. Cough: Teas, warm liquids, broths, and honey can help with cough. Albuterol: May use the albuterol as needed for instances of shortness of breath. Zyrtec or Claritin: May add these medication daily to control underlying symptoms of congestion, sneezing, and other signs of allergies.  These medications are available over-the-counter. Generics: Cetirizine (generic for Zyrtec) and loratadine (generic for Claritin). Fluticasone: Use fluticasone (generic for Flonase), as directed, for nasal and sinus congestion.  This medication is available over-the-counter. Congestion: Plain guaifenesin (generic for plain Mucinex) may help relieve congestion. Saline sinus rinses and saline nasal sprays may also help relieve congestion. If you do not have high blood pressure, heart problems, or an allergy to such medications, you may also try phenylephrine or Sudafed. Follow up: Follow up with a primary care provider within the next week should symptoms fail to resolve. Return: Return to the ED for significantly worsening symptoms, shortness of breath, persistent vomiting, large amounts of blood in stool, or any other major concerns.  For prescription assistance, may try using prescription discount sites or apps, such as goodrx.com

## 2019-03-06 NOTE — ED Notes (Signed)
Date and time results received: 03/06/19 7:23 PM  (use smartphrase ".now" to insert current time)  Test: Lactic Acid Critical Value: 4.6  Name of Provider Notified:Shawn Joy PA  Orders Received? Or Actions Taken?: septic protocol initiated

## 2019-03-06 NOTE — ED Triage Notes (Signed)
Pt states that today had fever of 102  Pt started actively seizing

## 2019-03-06 NOTE — ED Notes (Signed)
Pt ambulated 80 feet with SpO2 remaining around 95%the patient assisted back into bed and SpO2 at 97%. No c/o shortness of breath

## 2019-03-06 NOTE — ED Notes (Signed)
Pt verbalized discharge instructions and follow up care. No IVs. Alert and ambulatory. Father driving him home

## 2019-03-06 NOTE — ED Notes (Signed)
Pt informed that and demonstrated how to use an inhaler and spacer.

## 2019-03-06 NOTE — ED Notes (Signed)
Pt given phone to call his father per his request. Bed in locked and lowest position and call bell at bedside

## 2019-03-06 NOTE — ED Notes (Signed)
[phone] Koben Daman (father) called and would like an update. Pt gave verbal permission that RN allowed to talk to [facility]

## 2019-03-07 LAB — LEVETIRACETAM LEVEL: Levetiracetam Lvl: 9.7 ug/mL — ABNORMAL LOW (ref 10.0–40.0)

## 2019-03-07 LAB — LAMOTRIGINE LEVEL: Lamotrigine Lvl: 14.9 ug/mL (ref 2.0–20.0)

## 2019-03-11 LAB — CULTURE, BLOOD (ROUTINE X 2)
Culture: NO GROWTH
Culture: NO GROWTH
Special Requests: ADEQUATE
Special Requests: ADEQUATE

## 2019-05-01 ENCOUNTER — Telehealth (INDEPENDENT_AMBULATORY_CARE_PROVIDER_SITE_OTHER): Payer: Self-pay | Admitting: Family

## 2019-05-01 ENCOUNTER — Other Ambulatory Visit (INDEPENDENT_AMBULATORY_CARE_PROVIDER_SITE_OTHER): Payer: Self-pay | Admitting: Family

## 2019-05-01 DIAGNOSIS — G40309 Generalized idiopathic epilepsy and epileptic syndromes, not intractable, without status epilepticus: Secondary | ICD-10-CM

## 2019-05-01 NOTE — Telephone Encounter (Signed)
°  Who's calling (name and relationship to patient) : Carleene Mains (dad) Best contact number: [phone] Provider they see: Cloretta Ned  Reason for call: Patient need Rx refilled.  He has only 2 days left.    PRESCRIPTION REFILL ONLY  Name of prescription:Keppra  Pharmacy: [facility]

## 2019-05-01 NOTE — Telephone Encounter (Signed)
Please call to schedule patient for a follow up.

## 2019-05-12 ENCOUNTER — Telehealth (INDEPENDENT_AMBULATORY_CARE_PROVIDER_SITE_OTHER): Payer: Self-pay | Admitting: Family

## 2019-05-12 ENCOUNTER — Emergency Department (HOSPITAL_COMMUNITY): Payer: Self-pay

## 2019-05-12 ENCOUNTER — Encounter (HOSPITAL_COMMUNITY): Payer: Self-pay

## 2019-05-12 ENCOUNTER — Inpatient Hospital Stay (HOSPITAL_COMMUNITY)
Admission: EM | Admit: 2019-05-12 | Discharge: 2019-05-14 | DRG: 101 | Disposition: A | Discharge status: Home / Self Care | Payer: Self-pay | Attending: Internal Medicine | Admitting: Internal Medicine

## 2019-05-12 ENCOUNTER — Other Ambulatory Visit: Payer: Self-pay

## 2019-05-12 DIAGNOSIS — M25561 Pain in right knee: Secondary | ICD-10-CM | POA: Diagnosis present

## 2019-05-12 DIAGNOSIS — G40409 Other generalized epilepsy and epileptic syndromes, not intractable, without status epilepticus: Principal | ICD-10-CM | POA: Diagnosis present

## 2019-05-12 DIAGNOSIS — E860 Dehydration: Secondary | ICD-10-CM | POA: Diagnosis present

## 2019-05-12 DIAGNOSIS — Z20828 Contact with and (suspected) exposure to other viral communicable diseases: Secondary | ICD-10-CM | POA: Diagnosis present

## 2019-05-12 DIAGNOSIS — H35109 Retinopathy of prematurity, unspecified, unspecified eye: Secondary | ICD-10-CM | POA: Diagnosis present

## 2019-05-12 DIAGNOSIS — G40309 Generalized idiopathic epilepsy and epileptic syndromes, not intractable, without status epilepticus: Secondary | ICD-10-CM

## 2019-05-12 DIAGNOSIS — Z888 Allergy status to other drugs, medicaments and biological substances status: Secondary | ICD-10-CM

## 2019-05-12 DIAGNOSIS — R569 Unspecified convulsions: Secondary | ICD-10-CM

## 2019-05-12 DIAGNOSIS — E876 Hypokalemia: Secondary | ICD-10-CM | POA: Diagnosis present

## 2019-05-12 DIAGNOSIS — Z79899 Other long term (current) drug therapy: Secondary | ICD-10-CM

## 2019-05-12 LAB — BASIC METABOLIC PANEL
Anion gap: 5 (ref 5–15)
BUN: 7 mg/dL (ref 6–20)
CO2: 21 mmol/L — ABNORMAL LOW (ref 22–32)
Calcium: 5.4 mg/dL — CL (ref 8.9–10.3)
Chloride: 117 mmol/L — ABNORMAL HIGH (ref 98–111)
Creatinine, Ser: 0.6 mg/dL — ABNORMAL LOW (ref 0.61–1.24)
GFR calc Af Amer: >60 mL/min (ref >60)
GFR calc non Af Amer: >60 mL/min (ref >60)
Glucose, Bld: 73 mg/dL (ref 70–99)
Potassium: 2.6 mmol/L — CL (ref 3.5–5.1)
Sodium: 143 mmol/L (ref 135–145)

## 2019-05-12 LAB — CBC
HCT: 46.8 % (ref 39.0–52.0)
Hemoglobin: 15.3 g/dL (ref 13.0–17.0)
MCH: 30.9 pg (ref 26.0–34.0)
MCHC: 32.7 g/dL (ref 30.0–36.0)
MCV: 94.5 fL (ref 80.0–100.0)
Platelets: 283 10*3/uL (ref 150–400)
RBC: 4.95 MIL/uL (ref 4.22–5.81)
RDW: 12.7 % (ref 11.5–15.5)
WBC: 11.3 10*3/uL — ABNORMAL HIGH (ref 4.0–10.5)
nRBC: 0 % (ref 0.0–0.2)

## 2019-05-12 LAB — ALBUMIN: Albumin: 2.9 g/dL — ABNORMAL LOW (ref 3.5–5.0)

## 2019-05-12 LAB — MAGNESIUM: Magnesium: 1.5 mg/dL — ABNORMAL LOW (ref 1.7–2.4)

## 2019-05-12 MED ORDER — LAMOTRIGINE 100 MG PO TABS
300.0000 mg | ORAL_TABLET | Freq: Every day | ORAL | Status: DC
  Administered 2019-05-13 – 2019-05-14 (×2): 300 mg via ORAL
  Filled 2019-05-12 (×2): qty 3

## 2019-05-12 MED ORDER — LAMOTRIGINE ER 100 MG PO TB24: 350.0000 mg | ORAL_TABLET | Freq: Every morning | ORAL | Status: DC

## 2019-05-12 MED ORDER — CALCIUM GLUCONATE-NACL 1-0.675 GM/50ML-% IV SOLN
1.0000 g | Freq: Once | INTRAVENOUS | Status: AC
  Administered 2019-05-12: 1000 mg via INTRAVENOUS
  Filled 2019-05-12: qty 50

## 2019-05-12 MED ORDER — ACETAMINOPHEN 325 MG PO TABS
650.0000 mg | ORAL_TABLET | ORAL | Status: DC | PRN
  Administered 2019-05-13: 650 mg via ORAL
  Filled 2019-05-12: qty 2

## 2019-05-12 MED ORDER — ENOXAPARIN SODIUM 40 MG/0.4ML ~~LOC~~ SOLN
40.0000 mg | Freq: Every day | SUBCUTANEOUS | Status: DC
  Administered 2019-05-13 – 2019-05-14 (×2): 40 mg via SUBCUTANEOUS
  Filled 2019-05-12 (×3): qty 0.4

## 2019-05-12 MED ORDER — SODIUM CHLORIDE 0.9 % IV SOLN: 1.0000 g | Freq: Once | INTRAVENOUS | Status: DC

## 2019-05-12 MED ORDER — MAGNESIUM SULFATE 2 GM/50ML IV SOLN
2.0000 g | Freq: Once | INTRAVENOUS | Status: AC
  Administered 2019-05-13: 2 g via INTRAVENOUS
  Filled 2019-05-12: qty 50

## 2019-05-12 MED ORDER — LORAZEPAM 2 MG/ML IJ SOLN: 1.0000 mg | INTRAMUSCULAR | Status: DC | PRN

## 2019-05-12 MED ORDER — ACETAMINOPHEN 650 MG RE SUPP: 650.0000 mg | RECTAL | Status: DC | PRN

## 2019-05-12 MED ORDER — POTASSIUM CHLORIDE 10 MEQ/100ML IV SOLN
10.0000 meq | INTRAVENOUS | Status: AC
  Administered 2019-05-13 (×2): 10 meq via INTRAVENOUS
  Filled 2019-05-12 (×2): qty 100

## 2019-05-12 MED ORDER — LAMOTRIGINE 25 MG PO TABS
250.0000 mg | ORAL_TABLET | Freq: Every day | ORAL | Status: DC
  Administered 2019-05-13: 250 mg via ORAL
  Filled 2019-05-12: qty 2

## 2019-05-12 MED ORDER — LAMOTRIGINE 100 MG PO TABS
300.0000 mg | ORAL_TABLET | Freq: Every day | ORAL | Status: DC
  Administered 2019-05-13 (×2): 300 mg via ORAL
  Filled 2019-05-12 (×2): qty 3

## 2019-05-12 MED ORDER — POTASSIUM CHLORIDE CRYS ER 20 MEQ PO TBCR
40.0000 meq | EXTENDED_RELEASE_TABLET | Freq: Two times a day (BID) | ORAL | Status: DC
  Filled 2019-05-12: qty 2

## 2019-05-12 MED ORDER — POTASSIUM CHLORIDE 20 MEQ/15ML (10%) PO SOLN
60.0000 meq | Freq: Once | ORAL | Status: AC
  Administered 2019-05-13: 60 meq via ORAL
  Filled 2019-05-12: qty 45

## 2019-05-12 MED ORDER — LEVETIRACETAM 500 MG PO TABS
1500.0000 mg | ORAL_TABLET | Freq: Two times a day (BID) | ORAL | Status: DC
  Administered 2019-05-12 – 2019-05-13 (×2): 1500 mg via ORAL
  Filled 2019-05-12 (×2): qty 3

## 2019-05-12 MED ORDER — LAMOTRIGINE ER 200 MG PO TB24: 500.0000 mg | ORAL_TABLET | Freq: Every evening | ORAL | Status: DC

## 2019-05-12 MED ORDER — POTASSIUM CHLORIDE 10 MEQ/100ML IV SOLN
10.0000 meq | Freq: Once | INTRAVENOUS | Status: AC
  Administered 2019-05-12: 21:00:00 10 meq via INTRAVENOUS
  Filled 2019-05-12: qty 100

## 2019-05-12 NOTE — ED Provider Notes (Signed)
[facility]-EMERGENCY DEPT Provider Note   CSN: [id number] Arrival date & time: 05/12/19  1723     History   Chief Complaint Chief Complaint  Patient presents with   Seizures    HPI Franklin [name] is a 23 y.o. male.     The history is provided by the patient and a caregiver.  Seizures Seizure activity on arrival: no   Seizure type:  Grand mal Initial focality:  Multifocal Episode characteristics: abnormal movements, confusion, generalized shaking and unresponsiveness   Postictal symptoms: confusion   Return to baseline: yes   Severity:  Moderate Timing:  Clustered Number of seizures this episode:  2 Progression:  Resolved Context: not drug use   Recent head injury:  During the event PTA treatment:  None History of seizures: yes     Past Medical History:  Diagnosis Date   Prematurity    Retinopathy of prematurity    Seizures (HCC)     Patient Active Problem List   Diagnosis Date Noted   Hypokalemia 05/12/2019   Hypomagnesemia 05/12/2019   Hypocalcemia 05/12/2019   Intellectual delay 03/10/2018   Personal history of prematurity 03/10/2018   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 Authorizing Provider  LAMICTAL XR 100 MG TB24 24 hour tablet Take 1 tablet every morning along with Lamictal XR 250mg  Patient taking differently: Take 100 mg by mouth every morning. Take 1 tablet every morning along with Lamictal XR 250mg  03/01/19  Yes Goodpasture, Inetta Fermo, NP  LAMICTAL XR 200 MG TB24 24 hour tablet Take 1 tablet at night along with a Lamictal XR 300mg  tablet Patient taking differently: Take 200 mg by mouth  every evening. Take 1 tablet at night along with a Lamictal XR 300mg  tablet 03/01/19  Yes Goodpasture, Inetta Fermo, NP  LAMICTAL XR 250 MG TB24 24 hour tablet Take 1 tablet in the morning along with Lamictal XR 100mg  Patient taking differently: Take 250 mg by mouth every morning.  03/01/19  Yes Goodpasture, Inetta Fermo, NP  LAMICTAL XR 300 MG TB24 24 hour tablet Take 1 tablet at night along with Lamictal XR 200mg  Patient taking differently: Take 300 mg by mouth every evening.  03/01/19  Yes Goodpasture, Inetta Fermo, NP  levETIRAcetam (KEPPRA) 500 MG tablet TAKE 1/2 (ONE-HALF) TABLET BY MOUTH TWICE DAILY FOR 1 WEEK, THEN TAKE 1 TABLET BY MOUTH TWICE DAILY Patient taking differently: Take 500 mg by mouth 2 (two) times daily.  05/02/19  Yes Elveria Rising, NP  albuterol (VENTOLIN HFA) 108 (90 Base) MCG/ACT inhaler Inhale 2 puffs into the lungs every 6 (six) hours as needed for wheezing or shortness of breath. Patient not taking: Reported on 05/12/2019 03/06/19   Joy, Ines Bloomer C, PA-C  azithromycin (ZITHROMAX) 250 MG tablet Take 1 tablet (250 mg total) by mouth daily. Take first 2 tablets together, then 1 every day until finished. Patient not taking: Reported on 05/12/2019 03/06/19   Anselm Pancoast, PA-C    Family History Family History  Problem Relation Age of Onset   Diabetes type II Father    Hypercholesterolemia Father    Hypertension Father    Stroke  Paternal Grandmother    Autism Brother        Also has cognitive delays   Stroke Paternal Aunt     Social History Social History   Tobacco Use   Smoking status: Never Smoker   Smokeless tobacco: Never Used  Substance Use Topics   Alcohol use: No   Drug use: No     Allergies   Concerta [methylphenidate]   Review of Systems Review of Systems  Constitutional: Negative for chills and fever.  HENT: Negative for ear pain and sore throat.   Eyes: Negative for pain and visual disturbance.  Respiratory: Negative for cough and shortness of breath.     Cardiovascular: Negative for chest pain and palpitations.  Gastrointestinal: Negative for abdominal pain and vomiting.  Genitourinary: Negative for dysuria and hematuria.  Musculoskeletal: Positive for arthralgias and neck pain. Negative for back pain.  Skin: Negative for color change and rash.  Neurological: Positive for seizures and headaches. Negative for dizziness, tremors, syncope, facial asymmetry, speech difficulty, weakness, light-headedness and numbness.  All other systems reviewed and are negative.    Physical Exam Updated Vital Signs  ED Triage Vitals  Enc Vitals Group     BP 05/12/19 1745 118/77     Pulse Rate 05/12/19 1745 100     Resp 05/12/19 2045 14     Temp 05/12/19 1745 98.3 F (36.8 C)     Temp Source 05/12/19 1745 Oral     SpO2 05/12/19 1745 97 %     Weight 05/12/19 1747 138 lb (62.6 kg)     Height 05/12/19 1747 5\' 9"  (1.753 m)     Head Circumference --      Peak Flow --      Pain Score 05/12/19 1746 9     Pain Loc --      Pain Edu? --      Excl. in [place]? --     Physical Exam Vitals signs and nursing note reviewed.  Constitutional:      General: He is not in acute distress.    Appearance: He is well-developed. He is not ill-appearing.  HENT:     Head: Normocephalic and atraumatic.     Nose: Nose normal.     Mouth/Throat:     Mouth: Mucous membranes are moist.  Eyes:     Extraocular Movements: Extraocular movements intact.     Conjunctiva/sclera: Conjunctivae normal.     Pupils: Pupils are equal, round, and reactive to light.  Neck:     Musculoskeletal: Neck supple. No muscular tenderness.  Cardiovascular:     Rate and Rhythm: Normal rate and regular rhythm.     Pulses: Normal pulses.     Heart sounds: Normal heart sounds. No murmur.  Pulmonary:     Effort: Pulmonary effort is normal. No respiratory distress.     Breath sounds: Normal breath sounds.  Abdominal:     Palpations: Abdomen is soft.     Tenderness: There is no abdominal  tenderness.  Musculoskeletal: Normal range of motion.        General: Tenderness present.     Comments: Tenderness to right knee, no midline spinal tenderness, patient in cervical collar  Skin:    General: Skin is warm and dry.     Comments: Multiple mild hematomas to the head, legs, no active bleeding, no bruising  Neurological:     General: No focal deficit present.     Mental Status: He is alert and oriented to person, place,  and time.     Cranial Nerves: No cranial nerve deficit.     Sensory: No sensory deficit.     Motor: No weakness.     Gait: Gait normal.     Comments: 5+ out of 5 strength throughout, normal sensation, normal finger-to-nose finger,  Psychiatric:        Mood and Affect: Mood normal.      ED Treatments / Results  Labs (all labs ordered are listed, but only abnormal results are displayed) Labs Reviewed  CBC - Abnormal; Notable for the following components:      Result Value   WBC 11.3 (*)    All other components within normal limits  BASIC METABOLIC PANEL - Abnormal; Notable for the following components:   Potassium 2.6 (*)    Chloride 117 (*)    CO2 21 (*)    Creatinine, Ser 0.60 (*)    Calcium 5.4 (*)    All other components within normal limits  MAGNESIUM - Abnormal; Notable for the following components:   Magnesium 1.5 (*)    All other components within normal limits  ALBUMIN - Abnormal; Notable for the following components:   Albumin 2.9 (*)    All other components within normal limits  SARS CORONAVIRUS 2 (TAT 6-24 HRS)  CALCIUM, IONIZED  LEVETIRACETAM LEVEL  LAMOTRIGINE LEVEL  HIV ANTIBODY (ROUTINE TESTING W REFLEX)    EKG EKG Interpretation  Date/Time:  Friday May 12 2019 20:36:50 EDT Ventricular Rate:  93 PR Interval:    QRS Duration: 80 QT Interval:  346 QTC Calculation: 431 R Axis:   82 Text Interpretation:  Sinus rhythm Confirmed by Virgina NorfolkAdam, Alycea Segoviano [phone]([id number]) on 05/13/2019 12:37:49 AM   Radiology Ct Head Wo  Contrast  Result Date: 05/12/2019 CLINICAL DATA:  Seizures EXAM: CT HEAD WITHOUT CONTRAST CT CERVICAL SPINE WITHOUT CONTRAST TECHNIQUE: Multidetector CT imaging of the head and cervical spine was performed following the standard protocol without intravenous contrast. Multiplanar CT image reconstructions of the cervical spine were also generated. COMPARISON:  CT brain 03/06/2019 FINDINGS: CT HEAD FINDINGS Brain: No acute territorial infarction, hemorrhage, or intracranial mass. Stable ventricle size. Vascular: No hyperdense vessel.  No unexpected calcification Skull: Normal. Negative for fracture or focal lesion. Sinuses/Orbits: Mucosal thickening in the ethmoid and sphenoid sinus Other: None CT CERVICAL SPINE FINDINGS Alignment: No subluxation.  Facet alignment within normal limits. Skull base and vertebrae: No acute fracture. No primary bone lesion or focal pathologic process. Soft tissues and spinal canal: No prevertebral fluid or swelling. No visible canal hematoma. Disc levels:  Within normal limits Upper chest: Negative. Other: None IMPRESSION: 1. Negative.  No CT evidence for acute intracranial abnormality. 2. No acute osseous abnormality of the cervical spine Electronically Signed   By: Jasmine PangKim  Fujinaga M.D.   On: 05/12/2019 20:19   Ct Cervical Spine Wo Contrast  Result Date: 05/12/2019 CLINICAL DATA:  Seizures EXAM: CT HEAD WITHOUT CONTRAST CT CERVICAL SPINE WITHOUT CONTRAST TECHNIQUE: Multidetector CT imaging of the head and cervical spine was performed following the standard protocol without intravenous contrast. Multiplanar CT image reconstructions of the cervical spine were also generated. COMPARISON:  CT brain 03/06/2019 FINDINGS: CT HEAD FINDINGS Brain: No acute territorial infarction, hemorrhage, or intracranial mass. Stable ventricle size. Vascular: No hyperdense vessel.  No unexpected calcification Skull: Normal. Negative for fracture or focal lesion. Sinuses/Orbits: Mucosal thickening in the  ethmoid and sphenoid sinus Other: None CT CERVICAL SPINE FINDINGS Alignment: No subluxation.  Facet alignment within normal limits. Skull base  and vertebrae: No acute fracture. No primary bone lesion or focal pathologic process. Soft tissues and spinal canal: No prevertebral fluid or swelling. No visible canal hematoma. Disc levels:  Within normal limits Upper chest: Negative. Other: None IMPRESSION: 1. Negative.  No CT evidence for acute intracranial abnormality. 2. No acute osseous abnormality of the cervical spine Electronically Signed   By: Jasmine PangKim  Fujinaga M.D.   On: 05/12/2019 20:19   Dg Knee Complete 4 Views Right  Result Date: 05/12/2019 CLINICAL DATA:  Right knee pain following 2 seizures today. EXAM: RIGHT KNEE - COMPLETE 4+ VIEW COMPARISON:  09/14/2018 FINDINGS: Prepatellar soft tissue swelling. No fracture, dislocation or effusion. IMPRESSION: Prepatellar soft tissue swelling without fracture. Electronically Signed   By: Beckie SaltsSteven  Reid M.D.   On: 05/12/2019 20:01    Procedures .Critical Care Performed by: Virgina Norfolkuratolo, Nyjai Graff, DO Authorized by: Virgina Norfolkuratolo, Xylon Croom, DO   Critical care provider statement:    Critical care time (minutes):  35   Critical care was necessary to treat or prevent imminent or life-threatening deterioration of the following conditions:  Metabolic crisis and CNS failure or compromise   Critical care was time spent personally by me on the following activities:  Blood draw for specimens, development of treatment plan with patient or surrogate, discussions with consultants, discussions with primary provider, examination of patient, evaluation of patient's response to treatment, obtaining history from patient or surrogate, ordering and performing treatments and interventions, ordering and review of laboratory studies, re-evaluation of patient's condition, ordering and review of radiographic studies, pulse oximetry and review of old charts   I assumed direction of critical care for this  patient from another provider in my specialty: no     (including critical care time)  Medications Ordered in ED Medications  magnesium sulfate IVPB 2 g 50 mL (has no administration in time range)  levETIRAcetam (KEPPRA) tablet 1,500 mg (1,500 mg Oral Given 05/12/19 2222)  enoxaparin (LOVENOX) injection 40 mg (has no administration in time range)  LORazepam (ATIVAN) injection 1-2 mg (has no administration in time range)  acetaminophen (TYLENOL) tablet 650 mg (has no administration in time range)    Or  acetaminophen (TYLENOL) suppository 650 mg (has no administration in time range)  potassium chloride 20 MEQ/15ML (10%) solution 60 mEq (has no administration in time range)  potassium chloride 10 mEq in 100 mL IVPB (has no administration in time range)  lamoTRIgine (LAMICTAL) tablet 300 mg (has no administration in time range)  lamoTRIgine (LAMICTAL) tablet 300 mg (has no administration in time range)  lamoTRIgine (LAMICTAL) tablet 250 mg (has no administration in time range)  potassium chloride 10 mEq in 100 mL IVPB (0 mEq Intravenous Stopped 05/12/19 2222)  calcium gluconate 1 g/ 50 mL sodium chloride IVPB (0 g Intravenous Stopped 05/12/19 2222)     Initial Impression / Assessment and Plan / ED Course  I have reviewed the triage vital signs and the nursing notes.  Pertinent labs & imaging results that were available during my care of the patient were reviewed by me and considered in my medical decision making (see chart for details).        Majel HomerDustin R Helle is a 23 year old male with history of seizures who presents to the ED after seizures.  Patient normal vitals.  No fever.  Patient with 2 tonic-clonic type seizures today.  Patient is on Lamictal and Keppra.  States he is compliant with his medication.  States that he has had poor sleep recently.  Patient with  multiple bruises on his face and bilateral lower extremities from seizure-like activity today.  He appears to be back at his baseline  upon my evaluation.  He is neurologically intact.  CT scan of his head and neck were unremarkable.  X-ray of his right knee was unremarkable.  Likely with bony contusions on his lower extremity from shaken from seizures.  EKG showed sinus rhythm.  Normal intervals.  Patient with multiple electrolyte abnormalities with a potassium of 2.6, calcium 5.6, magnesium 1.5.  These electrolyte findings could be consistent with breakthrough seizures as well.  Patient was given IV calcium, potassium, magnesium.  Neurology recommends increasing his Keppra dose to 1500 mg twice a day and admission for electrolyte repletion.  Unsure if seizures today are related to electrolyte abnormalities or normal breakthrough seizures or combination of both.  To be admitted for further care.  Ionized calcium was ordered and is pending.  Hemodynamically stable throughout my care.  No other signs to suggest symptoms from low calcium.  This chart was dictated using voice recognition software.  Despite best efforts to proofread,  errors can occur which can change the documentation meaning.    Final Clinical Impressions(s) / ED Diagnoses   Final diagnoses:  Hypocalcemia  Seizures (HCC)  Hypokalemia  Hypomagnesemia    ED Discharge Orders    None       Virgina Norfolk, DO 05/13/19 0038

## 2019-05-12 NOTE — ED Triage Notes (Signed)
23 yo male arrived by [facility] from home. Pt had two grand mal seizures today three hours in between. 1st seizure was a few minutes as per family. Second seizure was unwitnessed pt was found in a room typically not in. It is abnormal per family for him to have more than one seizure. Pt is compliant with medication. Family stated that he did not hit his head but patient has multiple abrasions on head. Pt has been in post ictal state from about 25 min per [facility]. Pt has 18 gauge in left forearm given 548ml.of fluid, 4mg  of zofran given. No emesis. Bo 113/68 Hr 114 rr 22 spo2 100 on 2 liters cbg 109

## 2019-05-12 NOTE — ED Notes (Signed)
Pt remains on cardiac monitor.

## 2019-05-12 NOTE — Telephone Encounter (Signed)
  Who's calling (name and relationship to patient) : Tamaj Jurgens, dad  Best contact number: [phone]  Provider they see: [facility] [place]  Reason for call: Dad just wanted to inform Otila Kluver that patient stayed up late last night and had a seizure this afternoon. The seizure did not last long, just wanted to let Otila Kluver know about it.    PRESCRIPTION REFILL ONLY  Name of prescription:  Pharmacy:

## 2019-05-12 NOTE — Telephone Encounter (Signed)
He had a second seizure and is having trouble recovering from it.  I suspect this is because he had 2 in a day.  Father called EMS which I think was the right thing to do.  I told him that he might be admitted overnight for observation but that if he recovered they probably send him home.  I do not think that increasing his medication is going to make a difference.  His father is planning on cutting off the Internet which may help him deal with his sleep deprivation which lowers seizure threshold.

## 2019-05-12 NOTE — H&P (Signed)
History and Physical    Franklin [name] OXB:[id number] DOB: Sep 01, 1996 DOA: 05/12/2019  PCP: Patient, No Pcp Per  Patient coming from: Home via EMS  I have personally briefly reviewed patient's old medical records in [facility]  Chief Complaint: Seizures  HPI: KENNIETH [name] is a 23 y.o. male with medical history significant for generalized convulsive epilepsy who presents to the ED for evaluation of seizures.  Patient had 2 grand mal seizures at home occurring 3 hours apart.  First 1 was reportedly a few minutes and second was unwitnessed.  Patient currently takes Lamictal and 50 mg every a.m. and 500 mg every p.m. and Keppra 500 mg twice daily and reports adherence.  EMS were called and patient was reportedly in postictal state for about 25 minutes per EMS.  He was given 500 cc of fluid, 4 mg of Zofran and brought to the ED for further evaluation.  Patient reports feeling confused after his seizure and noticed multiple bruises and lacerations on his head and legs afterwards.  He denies any bowel or bladder incontinence.  He has had some muscle soreness.  He denies any recent subjective fevers, chills, diaphoresis, chest pain, dyspnea, cough, dysuria, diarrhea.  ED Course:  Initial vitals showed BP 118/77, pulse 100, RR 14, temp 98.3 Fahrenheit, SPO2 97% on room air.  Labs notable for potassium 2.6, magnesium 1.5, calcium 5.4, albumin 2.9, BUN 7, creatinine 0.6, serum glucose 73, bicarb 21, WBC 11.3, hemoglobin 15.3, platelets 283,000.  Lamotrigine and levetiracetam levels which were obtained and pending.  SARS-CoV-2 test was obtained and pending.  CT head and cervical spine without contrast were negative for acute intracranial abnormality or osseous abnormality of the cervical spine.  Right knee x-ray showed prepatellar soft tissue swelling without fracture.  Patient was given IV calcium gluconate, IV potassium, and IV magnesium.  EDP discussed the case with on-call neurology who  recommended increasing home Keppra to 1500 mg twice daily and can observe at [facility] but if having recurrent seizures will need to transfer to [facility].  The hospitalist service was consulted admit for further evaluation management.   Review of Systems: All systems reviewed and are negative except as documented in history of present illness above.   Past Medical History:  Diagnosis Date  . Prematurity   . Retinopathy of prematurity   . Seizures (HCC)     Past Surgical History:  Procedure Laterality Date  . Central line placement     As premature infant  . REFRACTIVE SURGERY     For retinopathy of prematurity    Social History:  reports that he has never smoked. He has never used smokeless tobacco. He reports that he does not drink alcohol or use drugs.  Allergies  Allergen Reactions  . Concerta [Methylphenidate] Other (See Comments)    Would start crying for little things like when someone coughed or said a specific word for example.     Family History  Problem Relation Age of Onset  . Diabetes type II Father   . Hypercholesterolemia Father   . Hypertension Father   . Stroke Paternal Grandmother   . Autism Brother        Also has cognitive delays  . Stroke Paternal Aunt      Prior to Admission medications   Medication Sig Start Date End Date Taking? Authorizing Provider  albuterol (VENTOLIN HFA) 108 (90 Base) MCG/ACT inhaler Inhale 2 puffs into the lungs every 6 (six) hours as needed  for wheezing or shortness of breath. 03/06/19   Joy, Shawn C, PA-C  azithromycin (ZITHROMAX) 250 MG tablet Take 1 tablet (250 mg total) by mouth daily. Take first 2 tablets together, then 1 every day until finished. 03/06/19   Lorayne Bender, PA-C  LAMICTAL XR 100 MG TB24 24 hour tablet Take 1 tablet every morning along with Lamictal XR 250mg  03/01/19   Rockwell Germany, NP  LAMICTAL XR 200 MG TB24 24 hour tablet Take 1 tablet at night along with a Lamictal XR 300mg  tablet  03/01/19   Rockwell Germany, NP  LAMICTAL XR 250 MG TB24 24 hour tablet Take 1 tablet in the morning along with Lamictal XR 100mg  03/01/19   Rockwell Germany, NP  LAMICTAL XR 300 MG TB24 24 hour tablet Take 1 tablet at night along with Lamictal XR 200mg  03/01/19   Rockwell Germany, NP  levETIRAcetam (KEPPRA) 500 MG tablet TAKE 1/2 (ONE-HALF) TABLET BY MOUTH TWICE DAILY FOR 1 WEEK, THEN TAKE 1 TABLET BY MOUTH TWICE DAILY 05/02/19   Rockwell Germany, NP    Physical Exam: Vitals:   05/12/19 1745 05/12/19 1747  BP: 118/77   Pulse: 100   Temp: 98.3 F (36.8 C)   TempSrc: Oral   SpO2: 97%   Weight:  62.6 kg  Height:  5\' 9"  (1.753 m)    Constitutional: Thin man resting supine in bed, NAD, calm, comfortable Eyes: PERRL, EOMI, lids and conjunctivae normal ENMT: Mucous membranes are moist. Posterior pharynx clear of any exudate or lesions.Normal dentition.  Small laceration lower lip. Neck: normal, supple, no masses. Respiratory: clear to auscultation bilaterally, no wheezing, no crackles. Normal respiratory effort. No accessory muscle use.  Cardiovascular: Regular rate and rhythm, no murmurs / rubs / gallops. No extremity edema. 2+ pedal pulses. Abdomen: no tenderness, no masses palpated. No hepatosplenomegaly. Bowel sounds positive.  Musculoskeletal: no clubbing / cyanosis. No joint deformity upper and lower extremities. Good ROM, no contractures. Normal muscle tone.  Skin: Multiple small areas of swelling on head and both legs with abrasions on both legs Neurologic: CN 2-12 grossly intact. Sensation intact, Strength 5/5 in all 4.  Psychiatric: Normal judgment and insight. Alert and oriented x 3. Normal mood.     Labs on Admission: I have personally reviewed following labs and imaging studies  CBC: Recent Labs  Lab 05/12/19 1938  WBC 11.3*  HGB 15.3  HCT 46.8  MCV 94.5  PLT 742   Basic Metabolic Panel: Recent Labs  Lab 05/12/19 1938 05/12/19 1945  NA 143  --   K 2.6*  --    CL 117*  --   CO2 21*  --   GLUCOSE 73  --   BUN 7  --   CREATININE 0.60*  --   CALCIUM 5.4*  --   MG  --  1.5*   GFR: Estimated Creatinine Clearance: 125 mL/min (A) (by C-G formula based on SCr of 0.6 mg/dL (L)). Liver Function Tests: Recent Labs  Lab 05/12/19 1945  ALBUMIN 2.9*   No results for input(s): LIPASE, AMYLASE in the last 168 hours. No results for input(s): AMMONIA in the last 168 hours. Coagulation Profile: No results for input(s): INR, PROTIME in the last 168 hours. Cardiac Enzymes: No results for input(s): CKTOTAL, CKMB, CKMBINDEX, TROPONINI in the last 168 hours. BNP (last 3 results) No results for input(s): PROBNP in the last 8760 hours. HbA1C: No results for input(s): HGBA1C in the last 72 hours. CBG: No results for input(s): GLUCAP in  the last 168 hours. Lipid Profile: No results for input(s): CHOL, HDL, LDLCALC, TRIG, CHOLHDL, LDLDIRECT in the last 72 hours. Thyroid Function Tests: No results for input(s): TSH, T4TOTAL, FREET4, T3FREE, THYROIDAB in the last 72 hours. Anemia Panel: No results for input(s): VITAMINB12, FOLATE, FERRITIN, TIBC, IRON, RETICCTPCT in the last 72 hours. Urine analysis:    Component Value Date/Time   COLORURINE YELLOW 03/06/2019 2012   APPEARANCEUR CLEAR 03/06/2019 2012   LABSPEC 1.014 03/06/2019 2012   PHURINE 7.0 03/06/2019 2012   GLUCOSEU NEGATIVE 03/06/2019 2012   HGBUR NEGATIVE 03/06/2019 2012   BILIRUBINUR NEGATIVE 03/06/2019 2012   KETONESUR NEGATIVE 03/06/2019 2012   PROTEINUR NEGATIVE 03/06/2019 2012   UROBILINOGEN 0.2 06/30/2015 1900   NITRITE NEGATIVE 03/06/2019 2012   LEUKOCYTESUR NEGATIVE 03/06/2019 2012    Radiological Exams on Admission: Ct Head Wo Contrast  Result Date: 05/12/2019 CLINICAL DATA:  Seizures EXAM: CT HEAD WITHOUT CONTRAST CT CERVICAL SPINE WITHOUT CONTRAST TECHNIQUE: Multidetector CT imaging of the head and cervical spine was performed following the standard protocol without intravenous  contrast. Multiplanar CT image reconstructions of the cervical spine were also generated. COMPARISON:  CT brain 03/06/2019 FINDINGS: CT HEAD FINDINGS Brain: No acute territorial infarction, hemorrhage, or intracranial mass. Stable ventricle size. Vascular: No hyperdense vessel.  No unexpected calcification Skull: Normal. Negative for fracture or focal lesion. Sinuses/Orbits: Mucosal thickening in the ethmoid and sphenoid sinus Other: None CT CERVICAL SPINE FINDINGS Alignment: No subluxation.  Facet alignment within normal limits. Skull base and vertebrae: No acute fracture. No primary bone lesion or focal pathologic process. Soft tissues and spinal canal: No prevertebral fluid or swelling. No visible canal hematoma. Disc levels:  Within normal limits Upper chest: Negative. Other: None IMPRESSION: 1. Negative.  No CT evidence for acute intracranial abnormality. 2. No acute osseous abnormality of the cervical spine Electronically Signed   By: Jasmine PangKim  Fujinaga M.D.   On: 05/12/2019 20:19   Ct Cervical Spine Wo Contrast  Result Date: 05/12/2019 CLINICAL DATA:  Seizures EXAM: CT HEAD WITHOUT CONTRAST CT CERVICAL SPINE WITHOUT CONTRAST TECHNIQUE: Multidetector CT imaging of the head and cervical spine was performed following the standard protocol without intravenous contrast. Multiplanar CT image reconstructions of the cervical spine were also generated. COMPARISON:  CT brain 03/06/2019 FINDINGS: CT HEAD FINDINGS Brain: No acute territorial infarction, hemorrhage, or intracranial mass. Stable ventricle size. Vascular: No hyperdense vessel.  No unexpected calcification Skull: Normal. Negative for fracture or focal lesion. Sinuses/Orbits: Mucosal thickening in the ethmoid and sphenoid sinus Other: None CT CERVICAL SPINE FINDINGS Alignment: No subluxation.  Facet alignment within normal limits. Skull base and vertebrae: No acute fracture. No primary bone lesion or focal pathologic process. Soft tissues and spinal canal: No  prevertebral fluid or swelling. No visible canal hematoma. Disc levels:  Within normal limits Upper chest: Negative. Other: None IMPRESSION: 1. Negative.  No CT evidence for acute intracranial abnormality. 2. No acute osseous abnormality of the cervical spine Electronically Signed   By: Jasmine PangKim  Fujinaga M.D.   On: 05/12/2019 20:19   Dg Knee Complete 4 Views Right  Result Date: 05/12/2019 CLINICAL DATA:  Right knee pain following 2 seizures today. EXAM: RIGHT KNEE - COMPLETE 4+ VIEW COMPARISON:  09/14/2018 FINDINGS: Prepatellar soft tissue swelling. No fracture, dislocation or effusion. IMPRESSION: Prepatellar soft tissue swelling without fracture. Electronically Signed   By: Beckie SaltsSteven  Reid M.D.   On: 05/12/2019 20:01    EKG: Independently reviewed. Sinus rhythm with LVH, not significantly changed from prior.  Assessment/Plan Principal Problem:   Generalized convulsive epilepsy (HCC)  Majel HomerDustin R Duchene is a 23 y.o. male with medical history significant for generalized convulsive epilepsy who is admitted after having seizures at home.  Recurrent seizures and history of generalized convulsive epilepsy: Suspect secondary to significant electrolyte abnormalities.  Reports adherence to home medications.  Has not had further seizures since arrival to the ED. -Keppra and Lamictal levels ordered and pending -Increase Keppra to 1500 mg twice daily per neurology recommendations -Continue Lamictal (home XR formulation not available, discussed with pharmacy who have adjusted to IR formulation) -IV Ativan ordered to use as needed for seizure activity -Correct electrolyte abnormalities  Hypokalemia and hypomagnesemia: IV magnesium supplementation ordered followed by potassium.  Repeat labs in a.m.  Hypocalcemia: Received IV calcium gluconate in ED.  Recheck in a.m.  DVT prophylaxis: Lovenox Code Status: Full code, confirmed with patient Family Communication: Discussed with patient, patient discussed with  family Disposition Plan: Likely discharge to home in 1-2 days Consults called: EDP discussed with neurology by phone Admission status: Observation   Darreld McleanVishal  MD [facility]  If 7PM-7AM, please contact night-coverage [email or web address]  05/12/2019, 10:19 PM

## 2019-05-12 NOTE — Telephone Encounter (Signed)
Noted, there is nothing to do.

## 2019-05-13 ENCOUNTER — Encounter (HOSPITAL_COMMUNITY): Payer: Self-pay | Admitting: *Deleted

## 2019-05-13 DIAGNOSIS — E876 Hypokalemia: Secondary | ICD-10-CM

## 2019-05-13 DIAGNOSIS — R569 Unspecified convulsions: Secondary | ICD-10-CM

## 2019-05-13 LAB — RENAL FUNCTION PANEL
Albumin: 4.4 g/dL (ref 3.5–5.0)
Anion gap: 10 (ref 5–15)
BUN: 7 mg/dL (ref 6–20)
CO2: 26 mmol/L (ref 22–32)
Calcium: 8.8 mg/dL — ABNORMAL LOW (ref 8.9–10.3)
Chloride: 102 mmol/L (ref 98–111)
Creatinine, Ser: 0.9 mg/dL (ref 0.61–1.24)
GFR calc Af Amer: >60 mL/min (ref >60)
GFR calc non Af Amer: >60 mL/min (ref >60)
Glucose, Bld: 134 mg/dL — ABNORMAL HIGH (ref 70–99)
Phosphorus: 2.8 mg/dL (ref 2.5–4.6)
Potassium: 4 mmol/L (ref 3.5–5.1)
Sodium: 138 mmol/L (ref 135–145)

## 2019-05-13 LAB — CBC
HCT: 44.8 % (ref 39.0–52.0)
Hemoglobin: 14.7 g/dL (ref 13.0–17.0)
MCH: 31.1 pg (ref 26.0–34.0)
MCHC: 32.8 g/dL (ref 30.0–36.0)
MCV: 94.7 fL (ref 80.0–100.0)
Platelets: 289 10*3/uL (ref 150–400)
RBC: 4.73 MIL/uL (ref 4.22–5.81)
RDW: 12.7 % (ref 11.5–15.5)
WBC: 8.3 10*3/uL (ref 4.0–10.5)
nRBC: 0 % (ref 0.0–0.2)

## 2019-05-13 LAB — SARS CORONAVIRUS 2 (TAT 6-24 HRS): SARS Coronavirus 2: NEGATIVE

## 2019-05-13 LAB — GLUCOSE, CAPILLARY: Glucose-Capillary: 116 mg/dL — ABNORMAL HIGH (ref 70–99)

## 2019-05-13 LAB — MAGNESIUM: Magnesium: 2.4 mg/dL (ref 1.7–2.4)

## 2019-05-13 MED ORDER — ENSURE ENLIVE PO LIQD
237.0000 mL | Freq: Two times a day (BID) | ORAL | Status: DC
  Administered 2019-05-13 (×2): 237 mL via ORAL

## 2019-05-13 MED ORDER — LEVETIRACETAM 500 MG PO TABS
500.0000 mg | ORAL_TABLET | Freq: Two times a day (BID) | ORAL | Status: DC
  Administered 2019-05-13 – 2019-05-14 (×2): 500 mg via ORAL
  Filled 2019-05-13 (×2): qty 1

## 2019-05-13 MED ORDER — CHLORHEXIDINE GLUCONATE CLOTH 2 % EX PADS
6.0000 | MEDICATED_PAD | Freq: Every day | CUTANEOUS | Status: DC
  Administered 2019-05-13 – 2019-05-14 (×2): 6 via TOPICAL

## 2019-05-13 MED ORDER — SODIUM CHLORIDE 0.9 % IV BOLUS
1000.0000 mL | Freq: Once | INTRAVENOUS | Status: AC
  Administered 2019-05-13: 10:00:00 1000 mL via INTRAVENOUS

## 2019-05-13 NOTE — Progress Notes (Signed)
PROGRESS NOTE  Franklin [name] UJW:[id number] DOB: [birth year] DOA: 05/12/2019 PCP: Patient, No Pcp Per  HPI/Recap of past 24 hours:  No seizure since admitted to the hospital, he appears to slow in answering questions, but aaox3, his father states patient is not at his baseline,  Assessment/Plan: Principal Problem:   Generalized convulsive epilepsy (Franklin [name]) Active Problems:   Hypokalemia   Hypomagnesemia   Hypocalcemia   Recurrent seizures: -had two tonic-clonic type seizures yesterday on 9/4 with abrasion and contusions -CT head /CT cervical spine no acute findings -he reports compliance with meds ( home meds Lamictal and 350 mg every a.m. and 500 mg every p.m. and Keppra 500 mg twice daily ),  keppra level and lamictal level in process -recurrent seizures thought due to sleep deprivation and electrolytes abnormalities due to poor appetite.  on presentation, he denies ab pain, no n/v.  -EDP consulted neurology on presentation who recommended increase keppra to 1500mg  bid and admit for electrolyte repletion -patient received two doses of keppra 1500mg  bid so far, I have reviewed paitent's neurologist Dr Melanee Left note from 9/4 who recommends continue home dose keppra, I have discussed case with neurology Dr Leonel Ramsay today regarding keppra dose, Dr Leonel Ramsay recommend keep keppra at 500mg  bid as patient's own neurologist recommended on 9/4. Dose changed back to 500mg  bid -no seizure since arrived to the hospital, he is oriented x3 but appear very sleepy, dad reports patient is not back to his baseline, will keep in the hospital overnight   Hypokalemia/hypomagnesemia/hypocalcemia: Replaced, normalized, will d/c tele, repeat lab in am He appear dehydrated , reports poor appetite recently, will give ivf bolus  Right knee pain: Prepatellar soft tissue swelling without fracture Will get PT eval  DVT Prophylaxis: lovenox   Code Status: full  Family Communication: patient and his dad  over the phone  Disposition Plan: home tomorrow if back to baseline   Consultants:  Neurology Dr Leonel Ramsay over the phone  Procedures:  none  Antibiotics:  none   Objective: BP (!) 86/41    Pulse 77    Temp 97.8 F (36.6 C) (Oral)    Resp 14    Ht 5\' 9"  (1.753 m)    Wt 62.6 kg    SpO2 91%    BMI 20.38 kg/m   Intake/Output Summary (Last 24 hours) at 05/13/2019 0957 Last data filed at 05/13/2019 0700 Gross per 24 hour  Intake 142.02 ml  Output 400 ml  Net -257.98 ml   Filed Weights   05/12/19 1747  Weight: 62.6 kg    Exam: Patient is examined daily including today on 05/13/2019, exams remain the same as of yesterday except that has changed    General:  Drowsy, but oriented x3  Cardiovascular: RRR  Respiratory: CTABL  Abdomen: Soft/ND/NT, positive BS  Musculoskeletal: No Edema  Neuro: oriented , no focal deficit   Data Reviewed: Basic Metabolic Panel: Recent Labs  Lab 05/12/19 1938 05/12/19 1945 05/13/19 0207  NA 143  --  138  K 2.6*  --  4.0  CL 117*  --  102  CO2 21*  --  26  GLUCOSE 73  --  134*  BUN 7  --  7  CREATININE 0.60*  --  0.90  CALCIUM 5.4*  --  8.8*  MG  --  1.5* 2.4  PHOS  --   --  2.8   Liver Function Tests: Recent Labs  Lab 05/12/19 1945 05/13/19 0207  ALBUMIN 2.9* 4.4   No  results for input(s): LIPASE, AMYLASE in the last 168 hours. No results for input(s): AMMONIA in the last 168 hours. CBC: Recent Labs  Lab 05/12/19 1938 05/13/19 0207  WBC 11.3* 8.3  HGB 15.3 14.7  HCT 46.8 44.8  MCV 94.5 94.7  PLT 283 289   Cardiac Enzymes:   No results for input(s): CKTOTAL, CKMB, CKMBINDEX, TROPONINI in the last 168 hours. BNP (last 3 results) No results for input(s): BNP in the last 8760 hours.  ProBNP (last 3 results) No results for input(s): PROBNP in the last 8760 hours.  CBG: No results for input(s): GLUCAP in the last 168 hours.  Recent Results (from the past 240 hour(s))  SARS CORONAVIRUS 2 (TAT 6-24 HRS)  Nasopharyngeal Nasopharyngeal Swab     Status: None   Collection Time: 05/12/19  8:28 PM   Specimen: Nasopharyngeal Swab  Result Value Ref Range Status   SARS Coronavirus 2 NEGATIVE NEGATIVE Final    Comment: (NOTE) SARS-CoV-2 target nucleic acids are NOT DETECTED. The SARS-CoV-2 RNA is generally detectable in upper and lower respiratory specimens during the acute phase of infection. Negative results do not preclude SARS-CoV-2 infection, do not rule out co-infections with other pathogens, and should not be used as the sole basis for treatment or other patient management decisions. Negative results must be combined with clinical observations, patient history, and epidemiological information. The expected result is Negative. Fact Sheet for Patients: HairSlick.nohttps://www.fda.gov/media/138098/download Fact Sheet for Healthcare Providers: quierodirigir.comhttps://www.fda.gov/media/138095/download This test is not yet approved or cleared by the [place] FDA and  has been authorized for detection and/or diagnosis of SARS-CoV-2 by FDA under an Emergency Use Authorization (EUA). This EUA will remain  in effect (meaning this test can be used) for the duration of the COVID-19 declaration under Section 56 4(b)(1) of the Act, 21 U.S.C. section 360bbb-3(b)(1), unless the authorization is terminated or revoked sooner. Performed at [facility], [address] [place], [place] [address]      Studies: Ct Head Wo Contrast  Result Date: 05/12/2019 CLINICAL DATA:  Seizures EXAM: CT HEAD WITHOUT CONTRAST CT CERVICAL SPINE WITHOUT CONTRAST TECHNIQUE: Multidetector CT imaging of the head and cervical spine was performed following the standard protocol without intravenous contrast. Multiplanar CT image reconstructions of the cervical spine were also generated. COMPARISON:  CT brain 03/06/2019 FINDINGS: CT HEAD FINDINGS Brain: No acute territorial infarction, hemorrhage, or intracranial mass. Stable ventricle size.  Vascular: No hyperdense vessel.  No unexpected calcification Skull: Normal. Negative for fracture or focal lesion. Sinuses/Orbits: Mucosal thickening in the ethmoid and sphenoid sinus Other: None CT CERVICAL SPINE FINDINGS Alignment: No subluxation.  Facet alignment within normal limits. Skull base and vertebrae: No acute fracture. No primary bone lesion or focal pathologic process. Soft tissues and spinal canal: No prevertebral fluid or swelling. No visible canal hematoma. Disc levels:  Within normal limits Upper chest: Negative. Other: None IMPRESSION: 1. Negative.  No CT evidence for acute intracranial abnormality. 2. No acute osseous abnormality of the cervical spine Electronically Signed   By: Jasmine PangKim  Fujinaga M.D.   On: 05/12/2019 20:19   Ct Cervical Spine Wo Contrast  Result Date: 05/12/2019 CLINICAL DATA:  Seizures EXAM: CT HEAD WITHOUT CONTRAST CT CERVICAL SPINE WITHOUT CONTRAST TECHNIQUE: Multidetector CT imaging of the head and cervical spine was performed following the standard protocol without intravenous contrast. Multiplanar CT image reconstructions of the cervical spine were also generated. COMPARISON:  CT brain 03/06/2019 FINDINGS: CT HEAD FINDINGS Brain: No acute territorial infarction, hemorrhage, or  intracranial mass. Stable ventricle size. Vascular: No hyperdense vessel.  No unexpected calcification Skull: Normal. Negative for fracture or focal lesion. Sinuses/Orbits: Mucosal thickening in the ethmoid and sphenoid sinus Other: None CT CERVICAL SPINE FINDINGS Alignment: No subluxation.  Facet alignment within normal limits. Skull base and vertebrae: No acute fracture. No primary bone lesion or focal pathologic process. Soft tissues and spinal canal: No prevertebral fluid or swelling. No visible canal hematoma. Disc levels:  Within normal limits Upper chest: Negative. Other: None IMPRESSION: 1. Negative.  No CT evidence for acute intracranial abnormality. 2. No acute osseous abnormality of the  cervical spine Electronically Signed   By: Jasmine Pang M.D.   On: 05/12/2019 20:19   Dg Knee Complete 4 Views Right  Result Date: 05/12/2019 CLINICAL DATA:  Right knee pain following 2 seizures today. EXAM: RIGHT KNEE - COMPLETE 4+ VIEW COMPARISON:  09/14/2018 FINDINGS: Prepatellar soft tissue swelling. No fracture, dislocation or effusion. IMPRESSION: Prepatellar soft tissue swelling without fracture. Electronically Signed   By: Beckie Salts M.D.   On: 05/12/2019 20:01    Scheduled Meds:  Chlorhexidine Gluconate Cloth  6 each Topical Daily   enoxaparin (LOVENOX) injection  40 mg Subcutaneous Daily   feeding supplement (ENSURE ENLIVE)  237 mL Oral BID BM   lamoTRIgine  250 mg Oral Q1400   lamoTRIgine  300 mg Oral QHS   lamoTRIgine  300 mg Oral Q0600   levETIRAcetam  500 mg Oral BID    Continuous Infusions:   Time spent: I have personally reviewed and interpreted on  05/13/2019 daily labs, tele strips, imagings as discussed above under date review session and assessment and plans.  I reviewed all nursing notes, pharmacy notes, consultant notes,  vitals, pertinent old records  I have discussed plan of care as described above with RN , patient and family on 05/13/2019   Albertine Grates MD, PhD, FACP  [facility] Pager [phone]. If 7PM-7AM, please contact night-coverage at [email or web address], password [facility] 05/13/2019, 9:57 AM  LOS: 0 days

## 2019-05-13 NOTE — Progress Notes (Signed)
Pt alert x 4 pleasant and cooperative tx from the ED with c/o seizure. No active seizure noted at this time. Bilateral frontal lobe contusion noted with laceration to bil legs, bil ecchymosis noted to bil knee caps. I will continue to monitor.

## 2019-05-13 NOTE — Progress Notes (Signed)
Spoke with patient's mom. Updated on patient's condition and all questions answered. Mother was concerned that patient did not sound right when she spoke with him over the phone. She mentioned that he took a long time to respond and she had to call his name multiple times before he responded to her. I checked on patient before calling his mom and he was stable, no acute changes noted, and seemed to be in good spirits. She mentioned patient usually eats better than what he has been while in the hospital. Will continue to encourage patient to eat and offer food & drinks frequently.   Patient's mother requested that a list of potassium rich foods be sent home with patient when he discharges to give to his father.   Will continue to monitor patient and update family with any significant changes.

## 2019-05-13 NOTE — Progress Notes (Signed)
Received report from [place] around 23:30, awaiting COVID- 19 results, made the charge RN aware. I will continue to monitor.

## 2019-05-14 LAB — CBC WITH DIFFERENTIAL/PLATELET
Abs Immature Granulocytes: 0.01 10*3/uL (ref 0.00–0.07)
Basophils Absolute: 0.1 10*3/uL (ref 0.0–0.1)
Basophils Relative: 1 %
Eosinophils Absolute: 0.4 10*3/uL (ref 0.0–0.5)
Eosinophils Relative: 7 %
HCT: 45.8 % (ref 39.0–52.0)
Hemoglobin: 15.1 g/dL (ref 13.0–17.0)
Immature Granulocytes: 0 %
Lymphocytes Relative: 36 %
Lymphs Abs: 2.1 10*3/uL (ref 0.7–4.0)
MCH: 30.8 pg (ref 26.0–34.0)
MCHC: 33 g/dL (ref 30.0–36.0)
MCV: 93.3 fL (ref 80.0–100.0)
Monocytes Absolute: 0.9 10*3/uL (ref 0.1–1.0)
Monocytes Relative: 15 %
Neutro Abs: 2.4 10*3/uL (ref 1.7–7.7)
Neutrophils Relative %: 41 %
Platelets: 328 10*3/uL (ref 150–400)
RBC: 4.91 MIL/uL (ref 4.22–5.81)
RDW: 12.4 % (ref 11.5–15.5)
WBC: 5.8 10*3/uL (ref 4.0–10.5)
nRBC: 0 % (ref 0.0–0.2)

## 2019-05-14 LAB — BASIC METABOLIC PANEL
Anion gap: 13 (ref 5–15)
BUN: 7 mg/dL (ref 6–20)
CO2: 26 mmol/L (ref 22–32)
Calcium: 9.1 mg/dL (ref 8.9–10.3)
Chloride: 98 mmol/L (ref 98–111)
Creatinine, Ser: 0.75 mg/dL (ref 0.61–1.24)
GFR calc Af Amer: >60 mL/min (ref >60)
GFR calc non Af Amer: >60 mL/min (ref >60)
Glucose, Bld: 104 mg/dL — ABNORMAL HIGH (ref 70–99)
Potassium: 3.4 mmol/L — ABNORMAL LOW (ref 3.5–5.1)
Sodium: 137 mmol/L (ref 135–145)

## 2019-05-14 LAB — TSH: TSH: 1.729 u[IU]/mL (ref 0.350–4.500)

## 2019-05-14 LAB — HIV ANTIBODY (ROUTINE TESTING W REFLEX): HIV Screen 4th Generation wRfx: NONREACTIVE

## 2019-05-14 LAB — MAGNESIUM: Magnesium: 2.2 mg/dL (ref 1.7–2.4)

## 2019-05-14 LAB — CALCIUM, IONIZED: Calcium, Ionized, Serum: 3.4 mg/dL — ABNORMAL LOW (ref 4.5–5.6)

## 2019-05-14 MED ORDER — MAGNESIUM OXIDE -MG SUPPLEMENT 400 (240 MG) MG PO TABS: 400.0000 mg | ORAL_TABLET | Freq: Every day | ORAL | 0 refills | Status: DC

## 2019-05-14 MED ORDER — ACETAMINOPHEN 325 MG PO TABS: 650.0000 mg | ORAL_TABLET | ORAL | 0 refills | Status: DC | PRN

## 2019-05-14 MED ORDER — ENSURE ENLIVE PO LIQD: 237.0000 mL | Freq: Two times a day (BID) | ORAL | 12 refills | Status: DC

## 2019-05-14 MED ORDER — POTASSIUM CHLORIDE CRYS ER 20 MEQ PO TBCR: 40.0000 meq | EXTENDED_RELEASE_TABLET | ORAL | 0 refills | Status: DC

## 2019-05-14 MED ORDER — ENSURE ENLIVE PO LIQD
237.0000 mL | Freq: Two times a day (BID) | ORAL | Status: DC
  Administered 2019-05-14: 237 mL via ORAL

## 2019-05-14 MED ORDER — POTASSIUM CHLORIDE CRYS ER 20 MEQ PO TBCR
40.0000 meq | EXTENDED_RELEASE_TABLET | Freq: Once | ORAL | Status: AC
  Administered 2019-05-14: 40 meq via ORAL
  Filled 2019-05-14: qty 2

## 2019-05-14 MED ORDER — BOOST / RESOURCE BREEZE PO LIQD CUSTOM: 1.0000 | ORAL | Status: DC

## 2019-05-14 NOTE — Discharge Instructions (Signed)
High-Potassium Foods List Food Amount Potassium (milligrams) Baked potato, with skin 1 medium; 925 mg White beans, canned  cup; 595 mg Avocado  fruit; 487 mg Fish: halibut, tuna, cod, snapper 3 oz; 480 mg Swiss chard  cup, cooked; 480 mg Banana 1 medium; 425 mg Spinach  cup cooked; 420 mg Papaya 1 small; 391 mg Milk: fat free, low fat, whole, buttermilk 1 cup (8 oz); 350-380 mg Lima beans  cup; 353 mg Artichoke, cooked 1 medium; 343 mg Soy milk 1 cup (8 oz); 287 mg Tomato or vegetable juice  cup (4 oz); 275 mg Dates 5 pieces; 270 mg Raisins  cup (2 oz); 270 mg Potato, boiled  cup; 255 mg Brussels sprouts  cup; 250 mg [place] 3 oz; 250 mg Sunflower or pumpkin seeds 1 oz; 240 mg Yogurt  cup (4 oz); 238 mg Orange 1 fruit; 237 mg Broccoli  cup; 230 mg Cantaloupe  cup; 215 mg Nuts: almonds, peanuts, hazelnuts, [place], cashew, mixed 1 oz; 200 mg Tuna fish, canned 3 oz; 200 mg

## 2019-05-14 NOTE — Progress Notes (Signed)
Initial Nutrition Assessment  RD working remotely.   DOCUMENTATION CODES:   Not applicable  INTERVENTION:  - continue Ensure Enlive BID, each supplement provides 350 kcal and 20 grams of protein. - will order Boost Breeze once/day, each supplement provides 250 kcal and 9 grams of protein. - will order daily multivitamin with minerals. - continue to encourage PO intakes.  - High Potassium food list placed in Discharge Instructions.    NUTRITION DIAGNOSIS:   Increased nutrient needs related to acute illness as evidenced by estimated needs.  GOAL:   Patient will meet greater than or equal to 90% of their needs  MONITOR:   PO intake, Supplement acceptance, Labs, Weight trends  REASON FOR ASSESSMENT:   Malnutrition Screening Tool  ASSESSMENT:   23 y.o. male with medical history significant for generalized convulsive epilepsy. He presented to the ED on 9/4 for evaluation of seizures. He had 2 grand mal seizures at home occurring 3 hours apart. First 1 was reportedly a few minutes and second was unwitnessed. EMS was called and patient was reportedly in postictal state for about 25 minutes. He reported feeling confused after his seizure and noticed multiple bruises and lacerations on his head and legs. He denied any bowel or bladder incontinence. He had some muscle soreness. CT head and cervical spine without contrast were negative for acute intracranial abnormality or osseous abnormality of the cervical spine. Right knee x-ray showed prepatellar soft tissue swelling without fracture.  Per chart review, patient consumed 5% of breakfast and 0% of lunch yesterday. He reports that his appetite was mainly good until he had the two seizures, although his sleep schedule has been off (mainly staying up late and sleeping in later) so eating habits have been somewhat affected by this. Appetite has been poor since admission.  Per chart review, current weight is 138 lb and weight on 2/26 was 134  lb. This indicates 4 lb weight gain in the past 6 months. No other more recent weight hx information available. Ensure Enlive was ordered BID yesterday and patient accepted both bottles of supplement offered to him.    Labs reviewed; K: 3.4 mmol/l. Medications reviewed; 40 mEq K-Dur x1 dose 9/6.     NUTRITION - FOCUSED PHYSICAL EXAM:  unable to complete at this time.   Diet Order:   Diet Order            Diet regular Room service appropriate? Yes; Fluid consistency: Thin  Diet effective now              EDUCATION NEEDS:   No education needs have been identified at this time  Skin:  Skin Assessment: Reviewed RN Assessment  Last BM:  PTA/unknown  Height:   Ht Readings from Last 1 Encounters:  05/12/19 5\' 9"  (1.753 m)    Weight:   Wt Readings from Last 1 Encounters:  05/12/19 62.6 kg    Ideal Body Weight:  72.7 kg  BMI:  Body mass index is 20.38 kg/m.  Estimated Nutritional Needs:   Kcal:  9242-6834 kcal  Protein:  90-105 grams  Fluid:  >/= 2 L/day     Jarome Matin, MS, RD, LDN, [facility] Inpatient Clinical Dietitian Pager # [phone] After hours/weekend pager # [phone]

## 2019-05-14 NOTE — Discharge Summary (Signed)
Discharge Summary  Franklin HomerDustin [name] RUE:[id number]:[id number] DOB: 06/28/1996  PCP: Patient, No Pcp Per  Admit date: 05/12/2019 Discharge date: 05/14/2019  Time spent: 30mins  Recommendations for Outpatient Follow-up:  1. F/u with PCP within a week  for hospital discharge follow up, repeat cbc/bmp at follow up.  2. F/u with neurology Dr Sharene SkeansHickling in one week  Discharge Diagnoses:  Active Hospital Problems   Diagnosis Date Noted   Generalized convulsive epilepsy (HCC) 06/14/2013   Seizure (HCC) 05/13/2019   Hypokalemia 05/12/2019   Hypomagnesemia 05/12/2019   Hypocalcemia 05/12/2019    Resolved Hospital Problems  No resolved problems to display.    Discharge Condition: stable  Diet recommendation: regular diet   Filed Weights   05/12/19 1747  Weight: 62.6 kg    History of present illness: (per admitting MD Dr Allena KatzPatel) Chief Complaint: Seizures  HPI: Franklin [name] is a 23 y.o. male with medical history significant for generalized convulsive epilepsy who presents to the ED for evaluation of seizures.  Patient had 2 grand mal seizures at home occurring 3 hours apart.  First 1 was reportedly a few minutes and second was unwitnessed.  Patient currently takes Lamictal and 50 mg every a.m. and 500 mg every p.m. and Keppra 500 mg twice daily and reports adherence.  EMS were called and patient was reportedly in postictal state for about 25 minutes per EMS.  He was given 500 cc of fluid, 4 mg of Zofran and brought to the ED for further evaluation.  Patient reports feeling confused after his seizure and noticed multiple bruises and lacerations on his head and legs afterwards.  He denies any bowel or bladder incontinence.  He has had some muscle soreness.  He denies any recent subjective fevers, chills, diaphoresis, chest pain, dyspnea, cough, dysuria, diarrhea.  ED Course:  Initial vitals showed BP 118/77, pulse 100, RR 14, temp 98.3 Fahrenheit, SPO2 97% on room air.  Labs notable for  potassium 2.6, magnesium 1.5, calcium 5.4, albumin 2.9, BUN 7, creatinine 0.6, serum glucose 73, bicarb 21, WBC 11.3, hemoglobin 15.3, platelets 283,000.  Lamotrigine and levetiracetam levels which were obtained and pending.  SARS-CoV-2 test was obtained and pending.  CT head and cervical spine without contrast were negative for acute intracranial abnormality or osseous abnormality of the cervical spine.  Right knee x-ray showed prepatellar soft tissue swelling without fracture.  Patient was given IV calcium gluconate, IV potassium, and IV magnesium.  EDP discussed the case with on-call neurology who recommended increasing home Keppra to 1500 mg twice daily and [facility] but if having recurrent seizures will need to transfer [facility].  The hospitalist service was consulted admit for further evaluation management.   Hospital Course:  Principal Problem:   Generalized convulsive epilepsy [facility](HCC) Active Problems:   Hypokalemia   Hypomagnesemia   Hypocalcemia   Seizure (HCC)    Recurrent seizures: -had two tonic-clonic type seizures yesterday on 9/4 with abrasion and contusions -CT head /CT cervical spine no acute findings -he reports compliance with meds ( home meds Lamictal and 350 mg every a.m. and 500 mg every p.m. and Keppra 500 mg twice daily ),  keppra level and lamictal level in process -recurrent seizures thought due to sleep deprivation and electrolytes abnormalities due to poor appetite.  on presentation, he denies ab pain, no n/v.  -EDP consulted neurology on presentation who recommended increase keppra to 1500mg  bid and admit for electrolyte repletion -patient received two doses of  keppra 1500mg  bid so far, I have reviewed paitent's neurologist Dr Darl Householder note from 9/4 who recommends continue home dose keppra, I have discussed case with neurology Dr Amada Jupiter today regarding keppra dose, Dr Amada Jupiter recommend keep keppra at 500mg  bid as  patient's own neurologist recommended on 9/4. Dose changed back to 500mg  bid -no seizure since arrived to the hospital, he is oriented x3 but appear very sleepy, dad reports patient is not back to his baseline, will keep in the hospital overnight -he is improving, no seizure, patient wants to go home, father agreed, will discharge home today , continue home seizure meds,  close follow up with neurology Dr Sharene Skeans.   Hypokalemia/hypomagnesemia/hypocalcemia: Replaced, normalized,  He appear dehydrated , reports poor appetite recently, he received ivf bolus He is discharged on k a nd mag supplement  Right knee pain: Prepatellar soft tissue swelling without fracture He ambulated without assistance , f/u with pcp  DVT Prophylaxis while in the hospital: lovenox   Code Status: full  Family Communication: patient and his dad over the phone daily  Disposition Plan: home with dad   Consultants:  Neurology Dr Amada Jupiter over the phone on 9/5  Procedures:  none  Antibiotics:  none    Discharge Exam: BP (!) 101/57    Pulse 61    Temp 98.1 F (36.7 C) (Oral)    Resp 15    Ht 5\' 9"  (1.753 m)    Wt 62.6 kg    SpO2 97%    BMI 20.38 kg/m   General: NAD, aaox3 Cardiovascular: RRR Respiratory: CTABL  Discharge Instructions You were cared for by a hospitalist during your hospital stay. If you have any questions about your discharge medications or the care you received while you were in the hospital after you are discharged, you can call the unit and asked to speak with the hospitalist on call if the hospitalist that took care of you is not available. Once you are discharged, your primary care physician will handle any further medical issues. Please note that NO REFILLS for any discharge medications will be authorized once you are discharged, as it is imperative that you return to your primary care physician (or establish a relationship with a primary care physician if you do  not have one) for your aftercare needs so that they can reassess your need for medications and monitor your lab values.  Discharge Instructions    Diet general   Complete by: As directed    Increase activity slowly   Complete by: As directed      Allergies as of 05/14/2019      Reactions   Concerta [methylphenidate] Other (See Comments)   Would start crying for little things like when someone coughed or said a specific word for example.       Medication List    STOP taking these medications   albuterol 108 (90 Base) MCG/ACT inhaler Commonly known as: VENTOLIN HFA   azithromycin 250 MG tablet Commonly known as: ZITHROMAX     TAKE these medications   acetaminophen 325 MG tablet Commonly known as: TYLENOL Take 2 tablets (650 mg total) by mouth every 4 (four) hours as needed for mild pain (or temp >/= 99.5).   feeding supplement (ENSURE ENLIVE) Liqd Take 237 mLs by mouth 2 (two) times daily between meals.   LaMICtal XR 100 MG Tb24 24 hour tablet Generic drug: LamoTRIgine Take 1 tablet every morning along with Lamictal XR 250mg  What changed:   how much to  take  how to take this  when to take this   LaMICtal XR 200 MG Tb24 24 hour tablet Generic drug: LamoTRIgine Take 1 tablet at night along with a Lamictal XR 300mg  tablet What changed:   how much to take  how to take this  when to take this   LaMICtal XR 250 MG Tb24 24 hour tablet Generic drug: LamoTRIgine Take 1 tablet in the morning along with Lamictal XR 100mg  What changed:   how much to take  how to take this  when to take this  additional instructions   LaMICtal XR 300 MG Tb24 24 hour tablet Generic drug: LamoTRIgine Take 1 tablet at night along with Lamictal XR 200mg  What changed:   how much to take  how to take this  when to take this  additional instructions   levETIRAcetam 500 MG tablet Commonly known as: KEPPRA TAKE 1/2 (ONE-HALF) TABLET BY MOUTH TWICE DAILY FOR 1 WEEK, THEN TAKE 1  TABLET BY MOUTH TWICE DAILY What changed:   how much to take  how to take this  when to take this  additional instructions   Magnesium Oxide 400 (240 Mg) MG Tabs Take 1 tablet (400 mg total) by mouth daily.   potassium chloride SA 20 MEQ tablet Commonly known as: K-DUR Take 2 tablets (40 mEq total) by mouth every Monday, Wednesday, and Friday. Start taking on: May 15, 2019      Allergies  Allergen Reactions   Concerta [Methylphenidate] Other (See Comments)    Would start crying for little things like when someone coughed or said a specific word for example.    Follow-up Information    Jodi Geralds, MD Follow up in 3 day(s).   Specialties: Pediatrics, Radiology Why: for recurrent seizures Contact information: [address] [place] [address] [phone]        [facility] Follow up in 1 week(s).   Contact information: [address] [place] [id number] [phone]           The results of significant diagnostics from this hospitalization (including imaging, microbiology, ancillary and laboratory) are listed below for reference.    Significant Diagnostic Studies: Ct Head Wo Contrast  Result Date: 05/12/2019 CLINICAL DATA:  Seizures EXAM: CT HEAD WITHOUT CONTRAST CT CERVICAL SPINE WITHOUT CONTRAST TECHNIQUE: Multidetector CT imaging of the head and cervical spine was performed following the standard protocol without intravenous contrast. Multiplanar CT image reconstructions of the cervical spine were also generated. COMPARISON:  CT brain 03/06/2019 FINDINGS: CT HEAD FINDINGS Brain: No acute territorial infarction, hemorrhage, or intracranial mass. Stable ventricle size. Vascular: No hyperdense vessel.  No unexpected calcification Skull: Normal. Negative for fracture or focal lesion. Sinuses/Orbits: Mucosal thickening in the ethmoid and sphenoid sinus Other: None CT CERVICAL SPINE  FINDINGS Alignment: No subluxation.  Facet alignment within normal limits. Skull base and vertebrae: No acute fracture. No primary bone lesion or focal pathologic process. Soft tissues and spinal canal: No prevertebral fluid or swelling. No visible canal hematoma. Disc levels:  Within normal limits Upper chest: Negative. Other: None IMPRESSION: 1. Negative.  No CT evidence for acute intracranial abnormality. 2. No acute osseous abnormality of the cervical spine Electronically Signed   By: Donavan Foil M.D.   On: 05/12/2019 20:19   Ct Cervical Spine Wo Contrast  Result Date: 05/12/2019 CLINICAL DATA:  Seizures EXAM: CT HEAD WITHOUT CONTRAST CT CERVICAL SPINE WITHOUT CONTRAST TECHNIQUE: Multidetector CT imaging  of the head and cervical spine was performed following the standard protocol without intravenous contrast. Multiplanar CT image reconstructions of the cervical spine were also generated. COMPARISON:  CT brain 03/06/2019 FINDINGS: CT HEAD FINDINGS Brain: No acute territorial infarction, hemorrhage, or intracranial mass. Stable ventricle size. Vascular: No hyperdense vessel.  No unexpected calcification Skull: Normal. Negative for fracture or focal lesion. Sinuses/Orbits: Mucosal thickening in the ethmoid and sphenoid sinus Other: None CT CERVICAL SPINE FINDINGS Alignment: No subluxation.  Facet alignment within normal limits. Skull base and vertebrae: No acute fracture. No primary bone lesion or focal pathologic process. Soft tissues and spinal canal: No prevertebral fluid or swelling. No visible canal hematoma. Disc levels:  Within normal limits Upper chest: Negative. Other: None IMPRESSION: 1. Negative.  No CT evidence for acute intracranial abnormality. 2. No acute osseous abnormality of the cervical spine Electronically Signed   By: Jasmine PangKim  Fujinaga M.D.   On: 05/12/2019 20:19   Dg Knee Complete 4 Views Right  Result Date: 05/12/2019 CLINICAL DATA:  Right knee pain following 2 seizures today. EXAM: RIGHT  KNEE - COMPLETE 4+ VIEW COMPARISON:  09/14/2018 FINDINGS: Prepatellar soft tissue swelling. No fracture, dislocation or effusion. IMPRESSION: Prepatellar soft tissue swelling without fracture. Electronically Signed   By: Beckie SaltsSteven  Reid M.D.   On: 05/12/2019 20:01    Microbiology: Recent Results (from the past 240 hour(s))  SARS CORONAVIRUS 2 (TAT 6-24 HRS) Nasopharyngeal Nasopharyngeal Swab     Status: None   Collection Time: 05/12/19  8:28 PM   Specimen: Nasopharyngeal Swab  Result Value Ref Range Status   SARS Coronavirus 2 NEGATIVE NEGATIVE Final    Comment: (NOTE) SARS-CoV-2 target nucleic acids are NOT DETECTED. The SARS-CoV-2 RNA is generally detectable in upper and lower respiratory specimens during the acute phase of infection. Negative results do not preclude SARS-CoV-2 infection, do not rule out co-infections with other pathogens, and should not be used as the sole basis for treatment or other patient management decisions. Negative results must be combined with clinical observations, patient history, and epidemiological information. The expected result is Negative. Fact Sheet for Patients: HairSlick.nohttps://www.fda.gov/media/138098/download Fact Sheet for Healthcare Providers: quierodirigir.comhttps://www.fda.gov/media/138095/download This test is not yet approved or cleared by the [place] FDA and  has been authorized for detection and/or diagnosis of SARS-CoV-2 by FDA under an Emergency Use Authorization (EUA). This EUA will remain  in effect (meaning this test can be used) for the duration of the COVID-19 declaration under Section 56 4(b)(1) of the Act, 21 U.S.C. section 360bbb-3(b)(1), unless the authorization is terminated or revoked sooner. Performed at [facility], [address] [place], [place] [address]      Labs: Basic Metabolic Panel: Recent Labs  Lab 05/12/19 1938 05/12/19 1945 05/13/19 0207 05/14/19 0243  NA 143  --  138 137  K 2.6*  --  4.0 3.4*  CL 117*  --   102 98  CO2 21*  --  26 26  GLUCOSE 73  --  134* 104*  BUN 7  --  7 7  CREATININE 0.60*  --  0.90 0.75  CALCIUM 5.4*  --  8.8* 9.1  MG  --  1.5* 2.4 2.2  PHOS  --   --  2.8  --    Liver Function Tests: Recent Labs  Lab 05/12/19 1945 05/13/19 0207  ALBUMIN 2.9* 4.4   No results for input(s): LIPASE, AMYLASE in the last 168 hours. No results for input(s): AMMONIA in the last 168 hours. CBC:  Recent Labs  Lab 05/12/19 1938 05/13/19 0207 05/14/19 0243  WBC 11.3* 8.3 5.8  NEUTROABS  --   --  2.4  HGB 15.3 14.7 15.1  HCT 46.8 44.8 45.8  MCV 94.5 94.7 93.3  PLT 283 289 328   Cardiac Enzymes: No results for input(s): CKTOTAL, CKMB, CKMBINDEX, TROPONINI in the last 168 hours. BNP: BNP (last 3 results) No results for input(s): BNP in the last 8760 hours.  ProBNP (last 3 results) No results for input(s): PROBNP in the last 8760 hours.  CBG: Recent Labs  Lab 05/13/19 1719  GLUCAP 116*       Signed:  Albertine Grates MD, PhD, FACP  [facility] 05/14/2019, 9:54 AM

## 2019-05-14 NOTE — Progress Notes (Signed)
Spoke with patient about the conversation over the phone with his mother (please see previous progress note 05/13/19 @ 2304). When asked if he remembers not responding to his mother when they were speaking on the phone, he mentioned that he gets easily distracted and always has even when he was younger.   Also spoke with him about how his mother was concerned with him not eating and offered to bring him something to eat. Educated on the importance of nutrition. He acknowledged the concern and requested crackers, peanut butter, and a chocolate protein shake. All items were given to him.

## 2019-05-16 LAB — LAMOTRIGINE LEVEL: Lamotrigine Lvl: 21.2 ug/mL — ABNORMAL HIGH (ref 2.0–20.0)

## 2019-05-17 LAB — LEVETIRACETAM LEVEL: Levetiracetam Lvl: <1 ug/mL — ABNORMAL LOW (ref 10.0–40.0)

## 2019-06-05 NOTE — Telephone Encounter (Signed)
LVM inviting dad to call and schedule f/u appt for pt.

## 2019-06-06 MED ORDER — LEVETIRACETAM 500 MG PO TABS: ORAL_TABLET | ORAL | 4 refills | Status: DC

## 2019-06-06 NOTE — Telephone Encounter (Signed)
I will continue to refill his medication but would like for Murice to come in at least once per year. He was last seen February 2020 so will need to come in February 2021. I asked our billing manager to call Dad to discuss financial plan for [facility]. TG

## 2019-06-06 NOTE — Telephone Encounter (Signed)
Dad returned my call regarding scheduling a f/u appt for pt. Dad wanted to inform clinic that due to pt only having family planning insurance he is not able to schedule an appointment at this present time. Pt may need refills in the future but may not desire to sched appt due to insurance issues. Dad wanted clinic to be aware.

## 2019-06-15 ENCOUNTER — Encounter (INDEPENDENT_AMBULATORY_CARE_PROVIDER_SITE_OTHER): Payer: Self-pay | Admitting: Family

## 2019-06-15 ENCOUNTER — Ambulatory Visit (INDEPENDENT_AMBULATORY_CARE_PROVIDER_SITE_OTHER): Payer: Self-pay | Admitting: Family

## 2019-06-15 ENCOUNTER — Other Ambulatory Visit: Payer: Self-pay

## 2019-06-15 VITALS — BP 122/74 | HR 96 | Ht 69.0 in | Wt 137.0 lb

## 2019-06-15 DIAGNOSIS — G40309 Generalized idiopathic epilepsy and epileptic syndromes, not intractable, without status epilepticus: Secondary | ICD-10-CM

## 2019-06-15 NOTE — Progress Notes (Signed)
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 He is taking and tolerating Lamictal that he receives from [employer] Patient Assistance Program and Levetiracetam that he purchases from the pharmacy. He has no insurance coverage.    Today's concerns: Franklin [name]'s [name] reports today that he had 2 seizures on May 12, 2019 and was admitted to the hospital for 2 days afterwards. He was found to be dehydrated and need potassium and magnesium supplementation. He had abrasions, lacerations and bruising from the fall that accompanied the seizure but no fractures. He has recovered from these injuries and has had no further seizures. Franklin [name] is generally compliant with medication but has difficulty with going to sleep at night. He is often awake until 2 or 3 AM and then sleeps until noon the next day. Dad feels that his seizures may be triggered by lack of sleep.   Levels were obtained at the recent hospitalization and found to have a Lamotrigine level for 21.102mcg/ml and Levetiracetam level of less than 29mcg/ml. There are problems with affording his medication but Dad says that he has not missed any doses.   Franklin [name] has been otherwise generally heatlhy since he was last seen. Neither he nor [name] have other health concerns for him today other than previously mentioned.   Review of systems: Please see HPI for neurologic and other pertinent review of systems. Otherwise all other systems were reviewed and were negative.  Problem List: Patient Active Problem List   Diagnosis Date Noted  . Seizure (HCC) 05/13/2019  . Hypokalemia 05/12/2019  . Hypomagnesemia  05/12/2019  . Hypocalcemia 05/12/2019  . Intellectual delay 03/10/2018  . Personal history of prematurity 03/10/2018  . 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 Long-term use of high-risk medication 06/14/2013     Past Medical History:  Diagnosis Date  . Prematurity   . Retinopathy of prematurity   . 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 He was tried on Concerta which caused significant emotional disturbance, then on Ritalin, then Adderall which worked well. He has mild hearing loss.  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   Birth History: He was born at [redacted] weeks gestation, weighing 1 lb 13 1/2 oz. He required ventilator support for sometime. He reportedly had no CNS hemorrhage. Growth and development was delayed.    Surgical history: Past Surgical History:  Procedure Laterality Date  . Central line placement     As premature infant  . REFRACTIVE SURGERY     For retinopathy of prematurity     Family history: family history includes Autism in his brother; Diabetes type II in [name]; Hypercholesterolemia in [name]; Hypertension in [name]; Stroke in his paternal aunt and paternal grandmother.   Social history: Social History   Socioeconomic History  . Marital status:  Single    Spouse name: Not on file  . Number of children: Not on file  . Years of education: Not on file  . Highest education level: Not on file  Occupational History  . Not on file  Social Needs  . Financial resource strain: Not hard at all  . Food insecurity    Worry: Never true    Inability:  Sometimes true  . Transportation needs    Medical: No    Non-medical: No  Tobacco Use  . Smoking status: Never Smoker  . Smokeless tobacco: Never Used  Substance and Sexual Activity  . Alcohol use: No  . Drug use: No  . Sexual activity: Not Currently  Lifestyle  . Physical activity    Days per week: 7 days    Minutes per session: 30 min  . Stress: To some extent  Relationships  . Social Musician on phone: Once a week    Gets together: Once a week    Attends religious service: Never    Active member of club or organization: No    Attends meetings of clubs or organizations: Never    Relationship status: Never married  . Intimate partner violence    Fear of current or ex partner: No    Emotionally abused: No    Physically abused: No    Forced sexual activity: No  Other Topics Concern  . Not on file  Social History Narrative   Parents are divorced. Franklin [name] lives with [name].      Past/failed meds:   Allergies: Allergies  Allergen Reactions  . Concerta [Methylphenidate] Other (See Comments)    Would start crying for little things like when someone coughed or said a specific word for example.       Immunizations: Immunization History  Administered Date(s) Administered  . Tdap 06/30/2015, 09/14/2018     Diagnostics/Screenings: EEG in 2006 - generalized convulsive epilepsy  Physical Exam: BP 122/74   Pulse 96   Ht 5\' 9"  (1.753 m)   Wt 137 lb (62.1 kg)   BMI 20.23 kg/m   General: Thin but well developed young man, seated on exam table, in no evident distress, brown hair, brown eyes, right handed Head: Head normocephalic and atraumatic.  Oropharynx benign. Neck: Supple with no carotid bruits Cardiovascular: Regular rate and rhythm, no murmurs Respiratory: Breath sounds clear to auscultation Musculoskeletal: No obvious deformities or scoliosis Skin: No rashes or neurocutaneous lesions  Neurologic Exam Mental Status: Awake and fully alert.   Oriented to place and time.  Recent and remote memory intact.  Attention span, concentration, and fund of knowledge subnormal for age.  Mood and affect appropriate. Cranial Nerves: Fundoscopic exam reveals sharp disc margins.  Pupils equal, briskly reactive to light.  Extraocular movements full without nystagmus.  Visual fields full to confrontation.  Hearing intact and symmetric to finger rub.  Facial sensation intact.  Face tongue, palate move normally and symmetrically.  Neck flexion and extension normal. Motor: Normal bulk and tone. Normal strength in all tested extremity muscles. Sensory: Intact to touch and temperature in all extremities.  Coordination: Rapid alternating movements normal in all extremities.  Finger-to-nose and heel-to shin performed accurately bilaterally.  Romberg negative. Gait and Station: Arises from chair without difficulty.  Stance is normal. Gait demonstrates normal stride length and balance.   Able to heel, toe and tandem walk without difficulty. Reflexes: 1+ and symmetric. Toes downgoing.  Impression: 1. Generalized convulsive epilepsy 2. Intellectual delay  3. History of prematurity   Recommendations for plan of care: [facility]. Dustinhas neither had nor required imaging or lab studies since the last visit other than what was performed at the ER in September. He and [name] are aware of those results. He is a 23 year old young man with generalized convulsive epilepsy, intellectual delay and history of prematurity. He is taking and tolerating Brand Lamictal that he gets from a patient assistance program and Levetiracetam that he purchases out of pocket due to lack of insurance. Franklin [name] has occasional breakthrough seizures typically in the setting of sleep deprivation. I talked with Franklin [name] about his sleep habits and about ways to work on sleep hygiene. He typically goes to bed at around 3AM and I recommended trying  to go to bed at 2AM for a few days, then 1AM for a few days, then midnight for a few days and so on, to help reset his sleep pattern. Franklin [name] agreed to try this. I will see him back in follow up in 1 year or sooner if needed. Franklin [name] agreed with the plans made today.   The medication list was reviewed and reconciled. No changes were made in the prescribed medications today. A complete medication list was provided to the patient.  Allergies as of 06/15/2019      Reactions   Concerta [methylphenidate] Other (See Comments)   Would start crying for little things like when someone coughed or said a specific word for example.       Medication List       Accurate as of June 15, 2019  2:44 PM. If you have any questions, ask your nurse or doctor.        acetaminophen 325 MG tablet Commonly known as: TYLENOL Take 2 tablets (650 mg total) by mouth every 4 (four) hours as needed for mild pain (or temp >/= 99.5).   feeding supplement (ENSURE ENLIVE) Liqd Take 237 mLs by mouth 2 (two) times daily between meals.   LaMICtal XR 100 MG Tb24 24 hour tablet Generic drug: LamoTRIgine Take 1 tablet every morning along with Lamictal XR 250mg  What changed:   how much to take  how to take this  when to take this   LaMICtal XR 200 MG Tb24 24 hour tablet Generic drug: LamoTRIgine Take 1 tablet at night along with a Lamictal XR 300mg  tablet What changed:   how much to take  how to take this  when to take this   LaMICtal XR 250 MG Tb24 24 hour tablet Generic drug: LamoTRIgine Take 1 tablet in the morning along with Lamictal XR 100mg  What changed:   how much to take  how to take this  when to take this  additional instructions   LaMICtal XR 300 MG Tb24 24 hour tablet Generic drug: LamoTRIgine Take 1 tablet at night along with Lamictal XR 200mg  What changed:   how much to take  how to take this  when to take this  additional instructions   levETIRAcetam 500 MG  tablet Commonly known as: KEPPRA TAKE 1 TABLET BY MOUTH TWICE DAILY   Magnesium Oxide 400 (240 Mg) MG Tabs Take 1 tablet (400 mg total) by mouth daily.   potassium chloride SA 20 MEQ tablet Commonly known as: KLOR-CON Take 2 tablets (40 mEq total) by mouth every Monday, Wednesday, and Friday.       I consulted with Dr Sharene SkeansHickling regarding this patient.  Total time spent with the patient was 15 minutes, of which 50% or more was spent in counseling and coordination of care.  Rockwell Germany NP-C [facility] Ph. [phone] Fax [phone]

## 2019-06-18 ENCOUNTER — Encounter (INDEPENDENT_AMBULATORY_CARE_PROVIDER_SITE_OTHER): Payer: Self-pay | Admitting: Family

## 2019-06-18 NOTE — Patient Instructions (Signed)
Thank you for coming in today.   Instructions for you until your next appointment are as follows: 1. Continue taking the Lamictal and Levetiracetam as you have been taking them. Try not to miss any doses.  2. Work on getting more sleep as we discussed today. Try going to bed earlier each night so that you will get more sleep before midnight. For example try going to bed at 2AM tonight instead of 3AM. Do that for a few nights, then go to bed at 1AM for a few nights, then midnight for a few nights, then 11PM etc. Sleep deprivation is known to trigger seizures and it is important for you to try to get more sleep and on a regular schedule. 3. Let me know if you have any seizures 4. Please sign up for MyChart if you have not done so 5. Please plan to return for follow up in one year or sooner if needed.

## 2019-09-18 ENCOUNTER — Telehealth (INDEPENDENT_AMBULATORY_CARE_PROVIDER_SITE_OTHER): Payer: Self-pay | Admitting: Family

## 2019-09-18 ENCOUNTER — Other Ambulatory Visit (INDEPENDENT_AMBULATORY_CARE_PROVIDER_SITE_OTHER): Payer: Self-pay | Admitting: Family

## 2019-09-18 DIAGNOSIS — G40309 Generalized idiopathic epilepsy and epileptic syndromes, not intractable, without status epilepticus: Secondary | ICD-10-CM

## 2019-09-18 MED ORDER — LAMICTAL XR 250 MG PO TB24: ORAL_TABLET | ORAL | 0 refills | Status: DC

## 2019-09-18 MED ORDER — LAMICTAL XR 200 MG PO TB24: ORAL_TABLET | ORAL | 0 refills | Status: DC

## 2019-09-18 MED ORDER — LAMICTAL XR 100 MG PO TB24: ORAL_TABLET | ORAL | 0 refills | Status: DC

## 2019-09-18 MED ORDER — LAMICTAL XR 300 MG PO TB24: ORAL_TABLET | ORAL | 0 refills | Status: DC

## 2019-09-18 NOTE — Telephone Encounter (Signed)
Dad called Dr Devonne Doughty, who called me about Franklin [name]'s medication. I called Dad who said that [facility] on [address] did not get the Rx's I sent in. He asked for me to send them to [facility] on [address], which I did. I called the [facility] and learned that they do not have the medication in stock but will order it and will be available on 09/20/19. I called Dad back and explained the situation to him. I instructed him to give Franklin [name] an extra Levetiracetam tonight and in the morning. I will call Dad in the morning and follow up on this situation. TG

## 2019-09-18 NOTE — Telephone Encounter (Signed)
Franklin [name] receives his medication from a patient assistance program. I sent in the 10 day emergency supply Rx's electronically and will fax a hard copy in the morning. TG

## 2019-09-18 NOTE — Addendum Note (Signed)
Addended by: Princella Ion on: 09/18/2019 09:15 PM   Modules accepted: Orders

## 2019-09-18 NOTE — Telephone Encounter (Signed)
  Who's calling (name and relationship to patient) : Franklin [name],Franklin [name] contact number: [phone] Provider they see: Blane Ohara Reason for call: Franklin [name] needs a 10 day emergency supply for all his Lamictal doses. Please call dad.     PRESCRIPTION REFILL ONLY  Name of prescription:  Pharmacy:

## 2019-09-19 MED ORDER — LAMICTAL XR 300 MG PO TB24: ORAL_TABLET | ORAL | 0 refills | Status: DC

## 2019-09-19 MED ORDER — LAMICTAL XR 250 MG PO TB24: ORAL_TABLET | ORAL | 0 refills | Status: DC

## 2019-09-19 MED ORDER — LAMICTAL XR 100 MG PO TB24: ORAL_TABLET | ORAL | 0 refills | Status: DC

## 2019-09-19 MED ORDER — LAMICTAL XR 200 MG PO TB24: ORAL_TABLET | ORAL | 0 refills | Status: DC

## 2019-09-19 NOTE — Telephone Encounter (Signed)
I faxed the Rx's to both [facility], then called Dad and instructed him to call [facility] to see when he can pick up the medication. Until he gets the medication, I instructed him to give Le[place] 2 of the Levetiracetam BID. Dad agreed with this plan. TG

## 2019-09-19 NOTE — Addendum Note (Signed)
Addended by: Princella Ion on: 09/19/2019 07:59 AM   Modules accepted: Orders

## 2019-10-30 ENCOUNTER — Encounter (INDEPENDENT_AMBULATORY_CARE_PROVIDER_SITE_OTHER): Payer: Self-pay | Admitting: Family

## 2019-12-08 ENCOUNTER — Ambulatory Visit: Payer: Medicaid Other | Attending: Internal Medicine

## 2019-12-08 DIAGNOSIS — Z23 Encounter for immunization: Secondary | ICD-10-CM

## 2019-12-08 NOTE — Progress Notes (Signed)
   Covid-19 Vaccination Clinic  Name:  RUFINO STAUP    MRN: [id number] DOB: [birth year]  12/08/2019  Mr. Tibbs was observed post Covid-19 immunization for 15 minutes without incident. He was provided with Vaccine Information Sheet and instruction to access the V-Safe system.   Mr. Lovingood was instructed to call 911 with any severe reactions post vaccine: Marland Kitchen Difficulty breathing  . Swelling of face and throat  . A fast heartbeat  . A bad rash all over body  . Dizziness and weakness   Immunizations Administered    Name Date Dose VIS Date Route   Pfizer COVID-19 Vaccine 12/08/2019  4:57 PM 0.3 mL 08/18/2019 Intramuscular   Manufacturer: [employer], [employer]   Lot: [id number]   NDC: [address]-1

## 2019-12-11 ENCOUNTER — Encounter (HOSPITAL_COMMUNITY): Payer: Self-pay

## 2019-12-11 ENCOUNTER — Other Ambulatory Visit: Payer: Self-pay

## 2019-12-11 ENCOUNTER — Emergency Department (HOSPITAL_COMMUNITY)
Admission: EM | Admit: 2019-12-11 | Discharge: 2019-12-11 | Disposition: A | Discharge status: Home / Self Care | Payer: Self-pay | Attending: Emergency Medicine | Admitting: Emergency Medicine

## 2019-12-11 ENCOUNTER — Emergency Department (HOSPITAL_COMMUNITY): Payer: Self-pay

## 2019-12-11 DIAGNOSIS — R509 Fever, unspecified: Secondary | ICD-10-CM | POA: Insufficient documentation

## 2019-12-11 DIAGNOSIS — R059 Cough, unspecified: Secondary | ICD-10-CM

## 2019-12-11 DIAGNOSIS — R05 Cough: Secondary | ICD-10-CM | POA: Insufficient documentation

## 2019-12-11 DIAGNOSIS — R071 Chest pain on breathing: Secondary | ICD-10-CM | POA: Insufficient documentation

## 2019-12-11 DIAGNOSIS — Z20822 Contact with and (suspected) exposure to covid-19: Secondary | ICD-10-CM | POA: Insufficient documentation

## 2019-12-11 NOTE — Discharge Instructions (Addendum)
Your xray today was within normal limits.  You have been tested for Covid 19, your results will be available to you within the next 24 hours.

## 2019-12-11 NOTE — ED Triage Notes (Signed)
Pt reports rib pain when he coughs. He states that he has been sneezing and coughing for about a week and those symptoms have improved, but not this pain. Pt legal guardian is his father, who drove him here. A&Ox4.

## 2019-12-11 NOTE — ED Provider Notes (Signed)
[facility] DEPT Provider Note   CSN: [id number] Arrival date & time: 12/11/19  1923     History Chief Complaint  Patient presents with  . Cough    Franklin [name] is a 24 y.o. male.  24 y.o male with a PMH of Seizures, Epilepsy presents to the ED presents with a chief complaint of cough x 1 week. Patient reports a dry cough without sputum, there is pain with deep inspiration along the right side of his ribs. Father at the bedside reports a similar episode in the past with patient had pneumonia and had similar pain. According, to father at the bedside patient has been running a low grade temperature with a Tmax of 99. Has not been taking any medication for improvement in symptoms. No chest pain, no prior history of blood clots, no other complaints.   The history is provided by the patient.       Past Medical History:  Diagnosis Date  . Prematurity   . Retinopathy of prematurity   . Seizures [facility])     Patient Active Problem List   Diagnosis Date Noted  . Seizure ([place]) 05/13/2019  . Hypokalemia 05/12/2019  . Hypomagnesemia 05/12/2019  . Hypocalcemia 05/12/2019  . Intellectual delay 03/10/2018  . Personal history of prematurity 03/10/2018  . Generalized convulsive epilepsy ([place]) 06/14/2013  . Long-term use of high-risk medication 06/14/2013    Past Surgical History:  Procedure Laterality Date  . Central line placement     As premature infant  . REFRACTIVE SURGERY     For retinopathy of prematurity       Family History  Problem Relation Age of Onset  . Diabetes type II Father   . Hypercholesterolemia Father   . Hypertension Father   . Stroke Paternal Grandmother   . Autism Brother        Also has cognitive delays  . Stroke Paternal Aunt     Social History   Tobacco Use  . Smoking status: Never Smoker  . Smokeless tobacco: Never Used  Substance Use Topics  . Alcohol use: No  . Drug use: No    Home Medications Prior to  Admission medications   Medication Sig Start Date End Date Taking? Authorizing Provider  acetaminophen (TYLENOL) 325 MG tablet Take 2 tablets (650 mg total) by mouth every 4 (four) hours as needed for mild pain (or temp >/= 99.5). Patient not taking: Reported on 06/15/2019 05/14/19   Florencia Reasons, MD  feeding supplement, ENSURE ENLIVE, (ENSURE ENLIVE) LIQD Take 237 mLs by mouth 2 (two) times daily between meals. 05/14/19   Florencia Reasons, MD  LAMICTAL XR 100 MG TB24 24 hour tablet Take 1 tablet every morning along with Lamictal XR 250mg  09/19/19   Rockwell Germany, NP  LAMICTAL XR 200 MG TB24 24 hour tablet Take 1 tablet at night along with a Lamictal XR 300mg  tablet 09/19/19   Rockwell Germany, NP  LAMICTAL XR 250 MG TB24 24 hour tablet Take 1 tablet in the morning along with Lamictal XR 100mg  09/19/19   Rockwell Germany, NP  LAMICTAL XR 300 MG TB24 24 hour tablet Take 1 tablet at night along with Lamictal XR 200mg  09/19/19   Rockwell Germany, NP  levETIRAcetam (KEPPRA) 500 MG tablet TAKE 1 TABLET BY MOUTH TWICE DAILY 06/06/19   Rockwell Germany, NP  Magnesium Oxide 400 (240 Mg) MG TABS Take 1 tablet (400 mg total) by mouth daily. Patient not taking: Reported on 06/15/2019 05/14/19   Erlinda Hong,  Parke Poisson, MD  potassium chloride SA (K-DUR) 20 MEQ tablet Take 2 tablets (40 mEq total) by mouth every Monday, Wednesday, and Friday. Patient not taking: Reported on 06/15/2019 05/15/19   Albertine Grates, MD    Allergies    Concerta [methylphenidate]  Review of Systems   Review of Systems  Constitutional: Negative for fever.  HENT: Negative for sore throat.   Respiratory: Positive for cough. Negative for shortness of breath.   Cardiovascular: Negative for chest pain and leg swelling.    Physical Exam Updated Vital Signs BP 132/82 (BP Location: Left Arm)   Pulse (!) 101   Temp 98.6 F (37 C) (Oral)   Resp 16   Ht 5\' 9"  (1.753 m)   Wt 59 kg   SpO2 100%   BMI 19.20 kg/m   Physical Exam Vitals and nursing note reviewed.   Constitutional:      Appearance: Normal appearance.  HENT:     Head: Normocephalic and atraumatic.     Nose: Nose normal.     Mouth/Throat:     Mouth: Mucous membranes are moist.  Eyes:     Pupils: Pupils are equal, round, and reactive to light.  Cardiovascular:     Rate and Rhythm: Normal rate.  Pulmonary:     Effort: Pulmonary effort is normal.     Breath sounds: Examination of the right-middle field reveals decreased breath sounds. Examination of the right-lower field reveals decreased breath sounds. Decreased breath sounds present. No wheezing.     Comments: Breath sounds are diminished to the right middle and lower lobe.  Abdominal:     General: Abdomen is flat.     Tenderness: There is no abdominal tenderness. There is no guarding.  Musculoskeletal:     Cervical back: Normal range of motion and neck supple.  Skin:    General: Skin is warm and dry.  Neurological:     Mental Status: He is alert and oriented to person, place, and time. Mental status is at baseline.     Comments: At baseline per father.      ED Results / Procedures / Treatments   Labs (all labs ordered are listed, but only abnormal results are displayed) Labs Reviewed  SARS CORONAVIRUS 2 (TAT 6-24 HRS)    EKG None  Radiology DG Chest 2 View  Result Date: 12/11/2019 CLINICAL DATA:  24 year old male with cough. EXAM: CHEST - 2 VIEW COMPARISON:  Chest radiograph dated 03/06/2019. FINDINGS: The lungs are clear. There is no pleural effusion or pneumothorax. The cardiac silhouette is within limits. No acute osseous pathology. IMPRESSION: No active cardiopulmonary disease. Electronically Signed   By: 03/08/2019 M.D.   On: 12/11/2019 21:20    Procedures Procedures (including critical care time)  Medications Ordered in ED Medications - No data to display  ED Course  I have reviewed the triage vital signs and the nursing notes.  Pertinent labs & imaging results that were available during my care of  the patient were reviewed by me and considered in my medical decision making (see chart for details).    MDM Rules/Calculators/A&P   Patient with a PMH of hydrocephalus presents to the ED with father at the bedside, reports URI symptoms for the past week including some right pain on his back, he reports a similar pain in the past when he had pneumonia.  According to that he is running fevers with a T-max of 99, has taken over-the-counter medication to manage his symptoms.  Reports there is  no color to his sputum.  No prior history of asthma, no chest pain or shortness of breath.  Xray of his chest showed:  No consolidation, no pneumothorax, no pleural effusion.   Father at the bedside is requesting covid 19 swab, discussed that this is very unlikely with mild URI symptoms that patient is having, but will provide this for patient as well. No prior history of blood clots, he is non ill, non toxic, HR was slightly elevated but within his baseline according to past visits.   I discussed the results of chest x-ray with father at the bedside, he is also aware that results will likely come back within 24 hours.  COVID-19 testing.  Return precautions were discussed at length.  Patient discharged in stable condition.   Portions of this note were generated with Scientist, clinical (histocompatibility and immunogenetics). Dictation errors may occur despite best attempts at proofreading.  Final Clinical Impression(s) / ED Diagnoses Final diagnoses:  Cough    Rx / DC Orders ED Discharge Orders    None       Freddy Jaksch 12/11/19 2146    Wynetta Fines, MD 12/13/19 [phone]

## 2019-12-11 NOTE — ED Notes (Signed)
Pt verbalizes understanding of DC instructions. Pt belongings returned and is ambulatory out of ED.    Pts legal guardian at beside and also verbalized understanding of DC instructions

## 2019-12-12 LAB — SARS CORONAVIRUS 2 (TAT 6-24 HRS): SARS Coronavirus 2: NEGATIVE

## 2019-12-14 ENCOUNTER — Emergency Department (HOSPITAL_COMMUNITY): Payer: Self-pay

## 2019-12-14 ENCOUNTER — Other Ambulatory Visit: Payer: Self-pay

## 2019-12-14 ENCOUNTER — Emergency Department (HOSPITAL_COMMUNITY)
Admission: EM | Admit: 2019-12-14 | Discharge: 2019-12-15 | Disposition: A | Discharge status: Home / Self Care | Payer: Self-pay | Attending: Emergency Medicine | Admitting: Emergency Medicine

## 2019-12-14 ENCOUNTER — Encounter (HOSPITAL_COMMUNITY): Payer: Self-pay | Admitting: Emergency Medicine

## 2019-12-14 DIAGNOSIS — R112 Nausea with vomiting, unspecified: Secondary | ICD-10-CM | POA: Insufficient documentation

## 2019-12-14 DIAGNOSIS — Z79899 Other long term (current) drug therapy: Secondary | ICD-10-CM | POA: Insufficient documentation

## 2019-12-14 DIAGNOSIS — R197 Diarrhea, unspecified: Secondary | ICD-10-CM | POA: Insufficient documentation

## 2019-12-14 DIAGNOSIS — R509 Fever, unspecified: Secondary | ICD-10-CM | POA: Insufficient documentation

## 2019-12-14 DIAGNOSIS — Z20822 Contact with and (suspected) exposure to covid-19: Secondary | ICD-10-CM | POA: Insufficient documentation

## 2019-12-14 LAB — URINALYSIS, ROUTINE W REFLEX MICROSCOPIC
Bacteria, UA: NONE SEEN
Bilirubin Urine: NEGATIVE
Glucose, UA: NEGATIVE mg/dL
Ketones, ur: 20 mg/dL — AB
Nitrite: NEGATIVE
Protein, ur: 30 mg/dL — AB
Specific Gravity, Urine: 1.014 (ref 1.005–1.030)
pH: 6 (ref 5.0–8.0)

## 2019-12-14 LAB — COMPREHENSIVE METABOLIC PANEL WITH GFR
ALT: 159 U/L — ABNORMAL HIGH (ref 0–44)
AST: 86 U/L — ABNORMAL HIGH (ref 15–41)
Albumin: 3 g/dL — ABNORMAL LOW (ref 3.5–5.0)
Alkaline Phosphatase: 1062 U/L — ABNORMAL HIGH (ref 38–126)
Anion gap: 11 (ref 5–15)
BUN: 10 mg/dL (ref 6–20)
CO2: 29 mmol/L (ref 22–32)
Calcium: 8.4 mg/dL — ABNORMAL LOW (ref 8.9–10.3)
Chloride: 96 mmol/L — ABNORMAL LOW (ref 98–111)
Creatinine, Ser: 0.68 mg/dL (ref 0.61–1.24)
GFR calc Af Amer: >60 mL/min (ref >60)
GFR calc non Af Amer: >60 mL/min (ref >60)
Glucose, Bld: 102 mg/dL — ABNORMAL HIGH (ref 70–99)
Potassium: 3.1 mmol/L — ABNORMAL LOW (ref 3.5–5.1)
Sodium: 136 mmol/L (ref 135–145)
Total Bilirubin: 2.5 mg/dL — ABNORMAL HIGH (ref 0.3–1.2)
Total Protein: 7.2 g/dL (ref 6.5–8.1)

## 2019-12-14 LAB — CBC WITH DIFFERENTIAL/PLATELET
Abs Immature Granulocytes: 0.1 K/uL — ABNORMAL HIGH (ref 0.00–0.07)
Basophils Absolute: 0.1 K/uL (ref 0.0–0.1)
Basophils Relative: 0 %
Eosinophils Absolute: 0.6 K/uL — ABNORMAL HIGH (ref 0.0–0.5)
Eosinophils Relative: 5 %
HCT: 41.5 % (ref 39.0–52.0)
Hemoglobin: 13.8 g/dL (ref 13.0–17.0)
Immature Granulocytes: 1 %
Lymphocytes Relative: 12 %
Lymphs Abs: 1.5 K/uL (ref 0.7–4.0)
MCH: 30.9 pg (ref 26.0–34.0)
MCHC: 33.3 g/dL (ref 30.0–36.0)
MCV: 93 fL (ref 80.0–100.0)
Monocytes Absolute: 1.2 K/uL — ABNORMAL HIGH (ref 0.1–1.0)
Monocytes Relative: 10 %
Neutro Abs: 8.9 K/uL — ABNORMAL HIGH (ref 1.7–7.7)
Neutrophils Relative %: 72 %
Platelets: 584 K/uL — ABNORMAL HIGH (ref 150–400)
RBC: 4.46 MIL/uL (ref 4.22–5.81)
RDW: 12.7 % (ref 11.5–15.5)
WBC: 12.4 K/uL — ABNORMAL HIGH (ref 4.0–10.5)
nRBC: 0 % (ref 0.0–0.2)

## 2019-12-14 MED ORDER — SODIUM CHLORIDE 0.9 % IV BOLUS
1000.0000 mL | Freq: Once | INTRAVENOUS | Status: AC
  Administered 2019-12-14: 1000 mL via INTRAVENOUS

## 2019-12-14 NOTE — ED Provider Notes (Signed)
[name] Provider Note   CSN: [id number] Arrival date & time: 12/14/19  1734     History Chief Complaint  Patient presents with  . Fever  . Emesis  . Headache    Franklin [name] is a 24 y.o. male.  HPI 24 year old male presents with fever.  History is from patient and dad.  Patient has been having cough, diarrhea, and a couple episodes of vomiting with decreased p.o. intake over the last week.  Nonproductive cough.  He also has headaches has been having for a long time though today the headache seemed a little worse but is now gone after taking some sort of pain reliever.  No sore throat.  Was here on 4/5 and had negative Covid test and chest x-ray but came back due to the temperature up to 101 today.  No urinary symptoms or abdominal pain.   Past Medical History:  Diagnosis Date  . Prematurity   . Retinopathy of prematurity   . Seizures [facility])     Patient Active Problem List   Diagnosis Date Noted  . Seizure ([place]) 05/13/2019  . Hypokalemia 05/12/2019  . Hypomagnesemia 05/12/2019  . Hypocalcemia 05/12/2019  . Intellectual delay 03/10/2018  . Personal history of prematurity 03/10/2018  . 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 Long-term use of high-risk medication 06/14/2013    Past Surgical History:  Procedure Laterality Date  . Central line placement     As premature infant  . REFRACTIVE SURGERY     For retinopathy of prematurity       Family History  Problem Relation Age of Onset  . Diabetes type II Father   . Hypercholesterolemia Father   . Hypertension Father   . Stroke Paternal Grandmother   . Autism Brother        Also has cognitive delays  . Stroke Paternal Aunt     Social History   Tobacco Use  . Smoking status: Never Smoker  . Smokeless tobacco: Never Used  Substance Use Topics  . Alcohol use: No  . Drug use: No    Home Medications Prior to Admission medications   Medication Sig Start  Date End Date Taking? Authorizing Provider  ibuprofen (ADVIL) 400 MG tablet Take 400 mg by mouth every 6 (six) hours as needed for fever or moderate pain.   Yes [provider]  LAMICTAL XR 100 MG TB24 24 hour tablet Take 1 tablet every morning along with Lamictal XR 266m 09/19/19  Yes Goodpasture, TOtila Kluver NP  LAMICTAL XR 200 MG TB24 24 hour tablet Take 1 tablet at night along with a Lamictal XR 3043mtablet 09/19/19  Yes Goodpasture, TiOtila KluverNP  LAMICTAL XR 250 MG TB24 24 hour tablet Take 1 tablet in the morning along with Lamictal XR 10073m/12/21  Yes Goodpasture, TinOtila KluverP  LAMICTAL XR 300 MG TB24 24 hour tablet Take 1 tablet at night along with Lamictal XR 200m24m12/21  Yes Goodpasture, TinaOtila Kluver  levETIRAcetam (KEPPRA) 500 MG tablet TAKE 1 TABLET BY MOUTH TWICE DAILY Patient taking differently: Take 500 mg by mouth 2 (two) times daily.  06/06/19  Yes [facility] [place]  acetaminophen (TYLENOL) 325 MG tablet Take 2 tablets (650 mg total) by mouth every 4 (four) hours as needed for mild pain (or temp >/= 99.5). Patient not taking: Reported on 06/15/2019 05/14/19   Xu, Florencia Reasons  feeding supplement, ENSURE ENLIVE, (ENSURE ENLIVE) LIQD Take 237 mLs by mouth 2 (two) times daily between  meals. Patient not taking: Reported on 12/14/2019 05/14/19   Florencia Reasons, MD  Magnesium Oxide 400 (240 Mg) MG TABS Take 1 tablet (400 mg total) by mouth daily. Patient not taking: Reported on 06/15/2019 05/14/19   Florencia Reasons, MD  potassium chloride SA (K-DUR) 20 MEQ tablet Take 2 tablets (40 mEq total) by mouth every Monday, Wednesday, and Friday. Patient not taking: Reported on 06/15/2019 05/15/19   Florencia Reasons, MD    Allergies    Concerta [methylphenidate]  Review of Systems   Review of Systems  Constitutional: Positive for fever.  Respiratory: Positive for cough. Negative for shortness of breath.   Cardiovascular: Positive for chest pain (intermittently).  Gastrointestinal: Positive for diarrhea and vomiting. Negative for  abdominal pain.  Neurological: Positive for headaches.  All other systems reviewed and are negative.   Physical Exam Updated Vital Signs BP 130/89   Pulse 98   Temp 98.7 F (37.1 C) (Oral)   Resp 16   SpO2 98%   Physical Exam Vitals and nursing note reviewed.  Constitutional:      General: He is not in acute distress.    Appearance: He is well-developed. He is not ill-appearing or diaphoretic.  HENT:     Head: Normocephalic and atraumatic.     Right Ear: External ear normal.     Left Ear: External ear normal.     Nose: Nose normal.  Eyes:     General:        Right eye: No discharge.        Left eye: No discharge.     Extraocular Movements: Extraocular movements intact.     Pupils: Pupils are equal, round, and reactive to light.  Cardiovascular:     Rate and Rhythm: Normal rate and regular rhythm.     Heart sounds: Normal heart sounds.  Pulmonary:     Effort: Pulmonary effort is normal.     Breath sounds: Normal breath sounds.  Abdominal:     General: There is no distension.     Palpations: Abdomen is soft.     Tenderness: There is no abdominal tenderness.  Musculoskeletal:     Cervical back: Normal range of motion and neck supple. No rigidity.  Skin:    General: Skin is warm and dry.  Neurological:     Mental Status: He is alert.     Comments: CN 3-12 grossly intact. 5/5 strength in all 4 extremities. Grossly normal sensation. Normal finger to nose.   Psychiatric:        Mood and Affect: Mood is not anxious.     ED Results / Procedures / Treatments   Labs (all labs ordered are listed, but only abnormal results are displayed) Labs Reviewed  COMPREHENSIVE METABOLIC PANEL - Abnormal; Notable for the following components:      Result Value   Potassium 3.1 (*)    Chloride 96 (*)    Glucose, Bld 102 (*)    Calcium 8.4 (*)    Albumin 3.0 (*)    AST 86 (*)    ALT 159 (*)    Alkaline Phosphatase 1,062 (*)    Total Bilirubin 2.5 (*)    All other components  within normal limits  CBC WITH DIFFERENTIAL/PLATELET - Abnormal; Notable for the following components:   WBC 12.4 (*)    Platelets 584 (*)    Neutro Abs 8.9 (*)    Monocytes Absolute 1.2 (*)    Eosinophils Absolute 0.6 (*)    Abs Immature  Granulocytes 0.10 (*)    All other components within normal limits  URINALYSIS, ROUTINE W REFLEX MICROSCOPIC - Abnormal; Notable for the following components:   Color, Urine AMBER (*)    Hgb urine dipstick SMALL (*)    Ketones, ur 20 (*)    Protein, ur 30 (*)    Leukocytes,Ua TRACE (*)    All other components within normal limits  URINE CULTURE  SARS CORONAVIRUS 2 (TAT 6-24 HRS)  INFLUENZA PANEL BY PCR (TYPE A & B)    EKG None  Radiology DG Chest 2 View  Result Date: 12/14/2019 CLINICAL DATA:  Cough.  Chest pain and fever. EXAM: CHEST - 2 VIEW COMPARISON:  December 11, 2019 FINDINGS: The heart size and mediastinal contours are within normal limits. Both lungs are clear. The visualized skeletal structures are unremarkable. The lungs are once again hyperexpanded. IMPRESSION: No active cardiopulmonary disease. Electronically Signed   By: Constance Holster M.D.   On: 12/14/2019 21:24    Procedures Procedures (including critical care time)  Medications Ordered in ED Medications  LamoTRIgine 24 hour tablet TB24 200 mg (has no administration in time range)  LamoTRIgine 24 hour tablet TB24 300 mg (has no administration in time range)  levETIRAcetam (KEPPRA) tablet 500 mg (has no administration in time range)  sodium chloride 0.9 % bolus 1,000 mL (0 mLs Intravenous Stopped 12/14/19 2359)    ED Course  I have reviewed the triage vital signs and the nursing notes.  Pertinent labs & imaging results that were available during my care of the patient were reviewed by me and considered in my medical decision making (see chart for details).    MDM Rules/Calculators/A&P                      Patient is well appearing. When he first checked in he was  tachycardic though on repeat exam this is benign.  He was given IV fluids.  He does complain of a headache earlier today but this probably went along with the fever.  Given his benign exam including normal range of motion of his neck, no altered mental status, no current fever, I highly doubt acute meningitis or encephalitis.  Otherwise, he states that when I push on his abdomen it tickles but he does have a little bit of guarding and his LFTs including alk phos are significantly abnormal.  Will get right upper quadrant ultrasound.  Care to Dr. Leonette Monarch.  I have personally reviewed the labs and images as above and incorporated these into my medical decision making.  At this point besides pending gallbladder evaluation, there is no clear bacterial etiology. Final Clinical Impression(s) / ED Diagnoses Final diagnoses:  Fever in adult    Rx / DC Orders ED Discharge Orders    None       Sherwood Gambler, MD 12/15/19 [phone]

## 2019-12-14 NOTE — ED Triage Notes (Signed)
Pt reports since last week had fevers, headaches intermittently and some vomiting (none since Sunday). Reports had headache medicine before left house and doesnt have one now.

## 2019-12-15 ENCOUNTER — Emergency Department (HOSPITAL_COMMUNITY): Payer: Self-pay

## 2019-12-15 ENCOUNTER — Other Ambulatory Visit (INDEPENDENT_AMBULATORY_CARE_PROVIDER_SITE_OTHER): Payer: Self-pay | Admitting: Family

## 2019-12-15 DIAGNOSIS — G40309 Generalized idiopathic epilepsy and epileptic syndromes, not intractable, without status epilepticus: Secondary | ICD-10-CM

## 2019-12-15 LAB — RESP PANEL BY RT PCR (RSV, FLU A&B, COVID)
Influenza A by PCR: NEGATIVE
Influenza B by PCR: NEGATIVE
Respiratory Syncytial Virus by PCR: NEGATIVE
SARS Coronavirus 2 by RT PCR: NEGATIVE

## 2019-12-15 LAB — SARS CORONAVIRUS 2 (TAT 6-24 HRS): SARS Coronavirus 2: NEGATIVE

## 2019-12-15 MED ORDER — LAMOTRIGINE ER 200 MG PO TB24: 200.0000 mg | ORAL_TABLET | Freq: Once | ORAL | Status: DC

## 2019-12-15 MED ORDER — LEVETIRACETAM 500 MG PO TABS
500.0000 mg | ORAL_TABLET | Freq: Two times a day (BID) | ORAL | Status: DC
  Administered 2019-12-15: 01:00:00 500 mg via ORAL
  Filled 2019-12-15: qty 1

## 2019-12-15 MED ORDER — POTASSIUM CHLORIDE CRYS ER 20 MEQ PO TBCR
40.0000 meq | EXTENDED_RELEASE_TABLET | Freq: Once | ORAL | Status: AC
  Administered 2019-12-15: 40 meq via ORAL
  Filled 2019-12-15: qty 2

## 2019-12-15 MED ORDER — LAMOTRIGINE ER 300 MG PO TB24: 300.0000 mg | ORAL_TABLET | Freq: Once | ORAL | Status: DC

## 2019-12-15 MED ORDER — ONDANSETRON 4 MG PO TBDP: 4.0000 mg | ORAL_TABLET | Freq: Three times a day (TID) | ORAL | 0 refills | Status: AC | PRN

## 2019-12-15 MED ORDER — IOHEXOL 300 MG/ML  SOLN
100.0000 mL | Freq: Once | INTRAMUSCULAR | Status: AC | PRN
  Administered 2019-12-15: 100 mL via INTRAVENOUS

## 2019-12-15 MED ORDER — SODIUM CHLORIDE (PF) 0.9 % IJ SOLN
INTRAMUSCULAR | Status: AC
  Filled 2019-12-15: qty 50

## 2019-12-15 NOTE — ED Notes (Signed)
Pt and legal guardian given discharge instructions and veberlized understanding.

## 2019-12-15 NOTE — ED Notes (Signed)
US at bedside

## 2019-12-15 NOTE — ED Provider Notes (Signed)
I assumed care of this patient.  Please see previous provider note for further details of Hx, PE.  Briefly patient is a 24 y.o. male who presented with fever, nausea, vomiting, diarrhea abdominal discomfort.  Labs revealed evidence of biliary obstruction right upper quadrant ultrasound.  Ultrasound without evidence of cholelithiasis or acute cholecystitis.  Common bile ducts upper limits of normal.  CT scan obtained to rule out obstructing mass which was negative for mass or other intra-abdominal inflammatory/infectious processes.  Patient was able to tolerate oral intake.  Stable for outpatient management.  Recommended close follow-up at home health and wellness.  Repeats LFTs.    The patient appears reasonably screened and/or stabilized for discharge and I doubt any other medical condition or other [facility] requiring further screening, evaluation, or treatment in the ED at this time prior to discharge. Safe for discharge with strict return precautions.  Disposition: Discharge  Condition: Good  I have discussed the results, Dx and Tx plan with the patient/family who expressed understanding and agree(s) with the plan. Discharge instructions discussed at length. The patient/family was given strict return precautions who verbalized understanding of the instructions. No further questions at time of discharge.    ED Discharge Orders         Ordered    ondansetron (ZOFRAN ODT) 4 MG disintegrating tablet  Every 8 hours PRN     12/15/19 0441           Follow Up: [facility] [address] [place] [address] [phone] Schedule an appointment as soon as possible for a visit  in 5-7 days, For close follow up to assess for repeat of liver enzymes  Primary care provider  Schedule an appointment as soon as possible for a visit  If you do not have a primary care physician, contact [facility] at [phone] for referral      Nira Conn, MD 12/15/19 [phone]

## 2019-12-16 LAB — URINE CULTURE: Culture: NO GROWTH

## 2019-12-28 ENCOUNTER — Emergency Department (HOSPITAL_COMMUNITY)
Admission: EM | Admit: 2019-12-28 | Discharge: 2019-12-28 | Disposition: A | Discharge status: Home / Self Care | Payer: Medicaid Other | Attending: Emergency Medicine | Admitting: Emergency Medicine

## 2019-12-28 ENCOUNTER — Other Ambulatory Visit: Payer: Self-pay

## 2019-12-28 DIAGNOSIS — H1032 Unspecified acute conjunctivitis, left eye: Secondary | ICD-10-CM | POA: Insufficient documentation

## 2019-12-28 MED ORDER — FLUORESCEIN SODIUM 1 MG OP STRP
1.0000 | ORAL_STRIP | Freq: Once | OPHTHALMIC | Status: AC
  Administered 2019-12-28: 16:00:00 1 via OPHTHALMIC
  Filled 2019-12-28: qty 1

## 2019-12-28 MED ORDER — ERYTHROMYCIN 5 MG/GM OP OINT: 1.0000 "application " | TOPICAL_OINTMENT | Freq: Four times a day (QID) | OPHTHALMIC | 0 refills | Status: AC

## 2019-12-28 NOTE — ED Triage Notes (Signed)
Left eye redness since the weekend; denies drainage. States there is associated pain especially with blinking. Denies contact use but does wear glasses.

## 2019-12-28 NOTE — Discharge Instructions (Addendum)
Please follow up with the eye doctor on 4/23

## 2019-12-28 NOTE — ED Provider Notes (Signed)
[facility]-EMERGENCY DEPT Provider Note   CSN: [id number] Arrival date & time: 12/28/19  1428     History Chief Complaint  Patient presents with  . Eye Problem    Franklin [name] is a 24 y.o. male.  Patient with past medical history of seizure disorder, intellectual delay, and retinopathy of prematurity presenting for issues of eye pain.  Patient reports that starting 1 week ago he started to have left eye pain as well as tearing.  Patient reports that pain is whenever he blinks or has to look fast in any direction.  Denies any trauma to the eye.  States he does do yard work but always wears glasses when he does this.  Reports no pustular drainage.  Does endorse photophobia.  Denies any headaches.  Eye has continued to stay red without any improvement x1 week.  Denies any vision changes and states that his left eye is usually stronger than the right eye.        Past Medical History:  Diagnosis Date  . Prematurity   . Retinopathy of prematurity   . Seizures [facility])     Patient Active Problem List   Diagnosis Date Noted  . Seizure ([facility]) 05/13/2019  . Hypokalemia 05/12/2019  . Hypomagnesemia 05/12/2019  . Hypocalcemia 05/12/2019  . Intellectual delay 03/10/2018  . Personal history of prematurity 03/10/2018  . 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 Long-term use of high-risk medication 06/14/2013    Past Surgical History:  Procedure Laterality Date  . Central line placement     As premature infant  . REFRACTIVE SURGERY     For retinopathy of prematurity       Family History  Problem Relation Age of Onset  . Diabetes type II Father   . Hypercholesterolemia Father   . Hypertension Father   . Stroke Paternal Grandmother   . Autism Brother        Also has cognitive delays  . Stroke Paternal Aunt     Social History   Tobacco Use  . Smoking status: Never Smoker  . Smokeless tobacco: Never Used  Substance Use Topics  .  Alcohol use: No  . Drug use: No    Home Medications Prior to Admission medications   Medication Sig Start Date End Date Taking? Authorizing Provider  acetaminophen (TYLENOL) 325 MG tablet Take 2 tablets (650 mg total) by mouth every 4 (four) hours as needed for mild pain (or temp >/= 99.5). Patient not taking: Reported on 06/15/2019 05/14/19   Albertine Grates, MD  erythromycin ophthalmic ointment Place 1 application into the left eye 4 (four) times daily for 7 days. Use ~1cm ribbon with each application 12/28/19 01/04/20  Oralia Manis, DO  feeding supplement, ENSURE ENLIVE, (ENSURE ENLIVE) LIQD Take 237 mLs by mouth 2 (two) times daily between meals. Patient not taking: Reported on 12/14/2019 05/14/19   Albertine Grates, MD  ibuprofen (ADVIL) 400 MG tablet Take 400 mg by mouth every 6 (six) hours as needed for fever or moderate pain.    [provider]  LAMICTAL XR 100 MG TB24 24 hour tablet Take 1 tablet every morning along with Lamictal XR 250mg  09/19/19   11/17/19, NP  LAMICTAL XR 200 MG TB24 24 hour tablet Take 1 tablet at night along with a Lamictal XR 300mg  tablet 09/19/19   , NP  LAMICTAL XR 250 MG TB24 24 hour tablet Take 1 tablet in the morning along with Lamictal XR 100mg  09/19/19  Elveria Rising, NP  LAMICTAL XR 300 MG TB24 24 hour tablet Take 1 tablet at night along with Lamictal XR 200mg  09/19/19   11/17/19, NP  levETIRAcetam (KEPPRA) 500 MG tablet Take 1 tablet by mouth twice daily 12/15/19   02/14/20, NP  Magnesium Oxide 400 (240 Mg) MG TABS Take 1 tablet (400 mg total) by mouth daily. Patient not taking: Reported on 06/15/2019 05/14/19   07/14/19, MD  potassium chloride SA (K-DUR) 20 MEQ tablet Take 2 tablets (40 mEq total) by mouth every Monday, Wednesday, and Friday. Patient not taking: Reported on 06/15/2019 05/15/19   07/15/19, MD    Allergies    Concerta [methylphenidate]  Review of Systems   Review of Systems  Constitutional: Negative for  fever.  Eyes: Positive for photophobia and pain. Negative for visual disturbance.    Physical Exam Updated Vital Signs BP 113/67   Pulse (!) 117   Temp 98.1 F (36.7 C) (Oral)   Resp 16   SpO2 100%   Physical Exam Constitutional:      General: He is not in acute distress.    Appearance: Normal appearance.     Comments: Wearing sunglasses   HENT:     Head: Normocephalic.     Nose: Nose normal.     Mouth/Throat:     Mouth: Mucous membranes are moist.     Pharynx: No oropharyngeal exudate or posterior oropharyngeal erythema.  Eyes:     General: Lids are normal.     Extraocular Movements: Extraocular movements intact.     Conjunctiva/sclera:     Left eye: Left conjunctiva is injected. No exudate or hemorrhage.    Pupils: Pupils are equal, round, and reactive to light.     Comments: Pain with EOM  Cardiovascular:     Rate and Rhythm: Normal rate and regular rhythm.     Heart sounds: No murmur. No friction rub. No gallop.   Pulmonary:     Effort: No respiratory distress.     Breath sounds: No wheezing, rhonchi or rales.  Abdominal:     General: Bowel sounds are normal.     Tenderness: There is no abdominal tenderness.  Musculoskeletal:        General: No swelling.  Skin:    General: Skin is warm.     Findings: No rash.  Neurological:     General: No focal deficit present.     Mental Status: He is alert.  Psychiatric:        Mood and Affect: Mood normal.     ED Results / Procedures / Treatments   Labs (all labs ordered are listed, but only abnormal results are displayed) Labs Reviewed - No data to display  EKG None  Radiology No results found.  Procedures Procedures (including critical care time)  Medications Ordered in ED Medications  fluorescein ophthalmic strip 1 strip (1 strip Left Eye Given by Other 12/28/19 1609)    ED Course  I have reviewed the triage vital signs and the nursing notes.  Pertinent labs & imaging results that were available  during my care of the patient were reviewed by me and considered in my medical decision making (see chart for details).    MDM Rules/Calculators/A&P                      Patient presenting with left eye erythema as well as clear drainage.  There is pain with extraocular movement which is concerning.  pH strip was normal with pH of 7.  Fluorescein strip was negative.  Concern for possible conjunctivitis.  Since increased pressures.  Unable to obtain pressures here in ED.  Patient endorses no vision changes but visual acuity did show left eye with 20/200.  I anticipate this is likely chronic as patient did have retinopathy of prematurity.  Discussed with ophthalmologist, Dr. Coralyn Pear.  Per Dr. Coralyn Pear he recommended treating as if he had bacterial conjunctivitis with a topical antibiotic.  We will plan to see patient in clinic tomorrow.  I have sent him an epic in basket message and he reports that his clinic will call the patient to set this up.  ED return precautions discussed.  Discussed with Dr. Vanita Panda who also saw patient  Final Clinical Impression(s) / ED Diagnoses Final diagnoses:  Acute conjunctivitis of left eye, unspecified acute conjunctivitis type    Rx / DC Orders ED Discharge Orders         Ordered    erythromycin ophthalmic ointment  4 times daily     12/28/19 [employer], [place], DO 12/28/19 1631    Carmin Muskrat, MD 01/01/20 0001

## 2019-12-29 ENCOUNTER — Encounter (INDEPENDENT_AMBULATORY_CARE_PROVIDER_SITE_OTHER): Payer: Self-pay | Admitting: Ophthalmology

## 2019-12-29 ENCOUNTER — Ambulatory Visit (INDEPENDENT_AMBULATORY_CARE_PROVIDER_SITE_OTHER): Payer: Self-pay | Admitting: Ophthalmology

## 2019-12-29 DIAGNOSIS — H2000 Unspecified acute and subacute iridocyclitis: Secondary | ICD-10-CM

## 2019-12-29 DIAGNOSIS — H35373 Puckering of macula, bilateral: Secondary | ICD-10-CM

## 2019-12-29 DIAGNOSIS — H35103 Retinopathy of prematurity, unspecified, bilateral: Secondary | ICD-10-CM

## 2019-12-29 DIAGNOSIS — H3581 Retinal edema: Secondary | ICD-10-CM

## 2019-12-29 MED ORDER — PREDNISOLONE ACETATE 1 % OP SUSP: 1.0000 [drp] | OPHTHALMIC | 0 refills | Status: DC

## 2019-12-29 MED ORDER — KETOROLAC TROMETHAMINE 0.5 % OP SOLN: 1.0000 [drp] | Freq: Four times a day (QID) | OPHTHALMIC | 0 refills | Status: AC

## 2019-12-29 NOTE — Progress Notes (Signed)
[facility] - Clinic Note  12/29/2019     CHIEF COMPLAINT Patient presents for Retina Follow Up   HISTORY OF PRESENT ILLNESS: Franklin [name] is a 24 y.o. male who presents to the clinic today for:   HPI    Retina Follow Up    Patient presents with  Other.  In left eye.  This started days ago.  Severity is moderate.  I, the attending physician,  performed the HPI with the patient and updated documentation appropriately.          Comments    Patient here for hospital follow up for conjunctival erythem OS. Patient states vision is getting better. The white part of eye hasn't got better. Has an eye patch that wears around the house because of sensitive to light. Got gel drops yesterday started using QID OS. Today best day. OS tears up in the PM.        Last edited by Rennis Chris, MD on 12/30/2019 11:57 PM. (History)    pt went to the ED yesterday bc his left eye was red, he states no one around him has had pink eye, pt states he was given a Rx for an antibiotic drop that he has been using, pt states he has not seen a eye dr since he was a teenager, he states his eye has been red for about a week and has not gotten any better, pt has ROP and epilepsy, he only takes his seizure medication on a daily basis  Referring physician: No referring provider defined for this encounter.  HISTORICAL INFORMATION:   Selected notes from the MEDICAL RECORD NUM[facility] ED f/u for conjunctival erythema   CURRENT MEDICATIONS: Current Outpatient Medications (Ophthalmic Drugs)  Medication Sig  . erythromycin ophthalmic ointment Place 1 application into the left eye 4 (four) times daily for 7 days. Use ~1cm ribbon with each application  . ketorolac (ACULAR) 0.5 % ophthalmic solution Place 1 drop into the left eye 4 (four) times daily.  . prednisoLONE acetate (PRED FORTE) 1 % ophthalmic suspension Place 1 drop into the left eye every hour.   No current facility-administered medications  for this visit. (Ophthalmic Drugs)   Current Outpatient Medications (Other)  Medication Sig  . acetaminophen (TYLENOL) 325 MG tablet Take 2 tablets (650 mg total) by mouth every 4 (four) hours as needed for mild pain (or temp >/= 99.5). (Patient not taking: Reported on 06/15/2019)  . feeding supplement, ENSURE ENLIVE, (ENSURE ENLIVE) LIQD Take 237 mLs by mouth 2 (two) times daily between meals. (Patient not taking: Reported on 12/14/2019)  . ibuprofen (ADVIL) 400 MG tablet Take 400 mg by mouth every 6 (six) hours as needed for fever or moderate pain.  Marland Kitchen LAMICTAL XR 100 MG TB24 24 hour tablet Take 1 tablet every morning along with Lamictal XR 250mg   . LAMICTAL XR 200 MG TB24 24 hour tablet Take 1 tablet at night along with a Lamictal XR 300mg  tablet  . LAMICTAL XR 250 MG TB24 24 hour tablet Take 1 tablet in the morning along with Lamictal XR 100mg   . LAMICTAL XR 300 MG TB24 24 hour tablet Take 1 tablet at night along with Lamictal XR 200mg   . levETIRAcetam (KEPPRA) 500 MG tablet Take 1 tablet by mouth twice daily  . Magnesium Oxide 400 (240 Mg) MG TABS Take 1 tablet (400 mg total) by mouth daily. (Patient not taking: Reported on 06/15/2019)  . potassium chloride SA (K-DUR) 20 MEQ tablet Take  2 tablets (40 mEq total) by mouth every Monday, Wednesday, and Friday. (Patient not taking: Reported on 06/15/2019)   No current facility-administered medications for this visit. (Other)      REVIEW OF SYSTEMS:    ALLERGIES Allergies  Allergen Reactions  . Concerta [Methylphenidate] Other (See Comments)    Would start crying for little things like when someone coughed or said a specific word for example.     PAST MEDICAL HISTORY Past Medical History:  Diagnosis Date  . Prematurity   . Retinopathy of prematurity   . Seizures (HCC)    Past Surgical History:  Procedure Laterality Date  . Central line placement     As premature infant  . REFRACTIVE SURGERY     For retinopathy of prematurity     FAMILY HISTORY Family History  Problem Relation Age of Onset  . Diabetes type II Father   . Hypercholesterolemia Father   . Hypertension Father   . Stroke Paternal Grandmother   . Autism Brother        Also has cognitive delays  . Stroke Paternal Aunt     SOCIAL HISTORY Social History   Tobacco Use  . Smoking status: Never Smoker  . Smokeless tobacco: Never Used  Substance Use Topics  . Alcohol use: No  . Drug use: No         OPHTHALMIC EXAM:  Base Eye Exam    Visual Acuity (Snellen - Linear)      Right Left   Dist cc 20/40 -2 20/70   Dist ph cc NI NI   Correction: Glasses       Tonometry (Tonopen, 1:07 PM)      Right Left   Pressure 12 12       Pupils      Dark Light Shape React APD   Right 2 1 Round Minimal None   Left 3 3 Round Minimal None       Visual Fields      Left Right   Restrictions Partial outer superior temporal, inferior temporal deficiencies Partial outer inferior nasal deficiency       Extraocular Movement      Right Left    Full, Ortho Full, Ortho       Neuro/Psych    Oriented x3: Yes   Mood/Affect: Normal       Dilation    Both eyes: 1.0% Mydriacyl, 2.5% Phenylephrine @ 1:05 PM        Slit Lamp and Fundus Exam    External Exam      Right Left   External  Epiphora       Slit Lamp Exam      Right Left   Lids/Lashes Meibomian gland dysfunction, Scurf Mild Meibomian gland dysfunction;   Conjunctiva/Sclera White and quiet 3-4+ Injection    Cornea Clear Clear; high tear lake; no KP   Anterior Chamber Deep and quiet Deep, 3-4+cell/pigment, fibrin ball inf nasal pupil   Iris Round and dilated poorly dilated to 36mm, +PS at 1200 and 0600   Lens 1+ Posterior subcapsular cataract pigment deposition on anterior capsule   Vitreous Normal Normal       Fundus Exam      Right Left   Disc Mild Pallor, Sharp rim no view due to poor dilation   Macula Flat, Good foveal reflex, mild Epiretinal membrane, RPE mottling and  clumping, incomplete development due to ROP no view    Vessels Vascular attenuation, Tortuous no view   Periphery  Attached, 360 laser scarring    no view due to poor dilation        Refraction    Wearing Rx      Sphere Cylinder Axis   Right -7.75 +1.75 058   Left -5.50 +1.25 136          IMAGING AND PROCEDURES  Imaging and Procedures for @TODAY @  OCT, Retina - OU - Both Eyes       Right Eye Quality was good. Central Foveal Thickness: 349. Progression has no prior data. Findings include abnormal foveal contour, epiretinal membrane, no IRF, no SRF (Peripheral ORA caught on widefield).   Left Eye Quality was borderline. Central Foveal Thickness: 384. Progression has no prior data. Findings include abnormal foveal contour, epiretinal membrane, no IRF, no SRF (ERM with central thickening).   Notes *Images captured and stored on drive  Diagnosis / Impression:  OD: hx of ROP with peripheral ORA, +ERM OS: ERM with central thickening  Clinical management:  See below  Abbreviations: NFP - Normal foveal profile. CME - cystoid macular edema. PED - pigment epithelial detachment. IRF - intraretinal fluid. SRF - subretinal fluid. EZ - ellipsoid zone. ERM - epiretinal membrane. ORA - outer retinal atrophy. ORT - outer retinal tubulation. SRHM - subretinal hyper-reflective material        Color Fundus Photography Optos - OU - Both Eyes       Right Eye Progression has no prior data. Disc findings include pallor. Macula : epiretinal membrane. Vessels : attenuated, tortuous vessels (ROP). Periphery : RPE abnormality (Hazy view, 360 laser scars).   Left Eye Progression has no prior data. Disc findings include pallor. Macula : epiretinal membrane. Vessels : tortuous vessels, attenuated (ROP). Periphery : RPE abnormality (Hazy view, 360 laser scars).   Notes **Images stored on drive**  Impression: History of ROP 360 peripheral CR scarring (laser vs cryo)                   ASSESSMENT/PLAN:    ICD-10-CM   1. Acute anterior uveitis  H20.00   2. Retinopathy of prematurity of both eyes  H35.103 Color Fundus Photography Optos - OU - Both Eyes  3. Epiretinal membrane (ERM) of both eyes  H35.373   4. Retinal edema  H35.81 OCT, Retina - OU - Both Eyes    1. Anterior uveitis OS  - 1 wk history of eye pain, redness, tearing, photophobia  - pt denies prior episodes  - exam shows 3-4+ AC cell/pigment w/ fibrin ball; 3-4+ conj injxn; +posterior synechiae and poor dilation  - Start PF Q1H OS  - Ketorolac QID OS  - Atropine BID OS  - po Ibuprofen 600mg  TID  - f/u Monday, April 26, 9:00AM  2. History of ROP  - 360 CR scarring OU -- laser vs cryo  - VA 20/40 OD, 20/70 OS (decreased OS due to ant uveitis?)  - monitor   3,4. Mild ERM OU - The natural history, anatomy, potential for loss of vision, and treatment options including vitrectomy techniques and the complications of endophthalmitis, retinal detachment, vitreous hemorrhage, cataract progression and permanent vision loss discussed with the patient. - mild ERM with central edema - asymptomatic, no metamorphopsia - no indication for surgery at this time - monitor for now   Ophthalmic Meds Ordered this visit:  Meds ordered this encounter  Medications  . prednisoLONE acetate (PRED FORTE) 1 % ophthalmic suspension    Sig: Place 1 drop into the left eye every hour.  Dispense:  15 mL    Refill:  0  . ketorolac (ACULAR) 0.5 % ophthalmic solution    Sig: Place 1 drop into the left eye 4 (four) times daily.    Dispense:  10 mL    Refill:  0       Return in about 3 days (around 01/01/2020) for f/u Monday, April 26 9:00AM.  There are no Patient Instructions on file for this visit.   Explained the diagnoses, plan, and follow up with the patient and they expressed understanding.  Patient expressed understanding of the importance of proper follow up care.   This document serves as a record of services  personally performed by Karie Chimera, MD, PhD. It was created on their behalf by Laurian Brim, OA, an ophthalmic assistant. The creation of this record is the provider's dictation and/or activities during the visit.    Electronically signed by: Laurian Brim, OA 04.23.2021 12:26 AM .  Karie Chimera, M.D., Ph.D. Diseases & Surgery of the Retina and Vitreous [facility]  I have reviewed the above documentation for accuracy and completeness, and I agree with the above. Karie Chimera, M.D., Ph.D. 12/31/19 12:26 AM   Abbreviations: M myopia (nearsighted); A astigmatism; H hyperopia (farsighted); P presbyopia; Mrx spectacle prescription;  CTL contact lenses; OD right eye; OS left eye; OU both eyes  XT exotropia; ET esotropia; PEK punctate epithelial keratitis; PEE punctate epithelial erosions; DES dry eye syndrome; MGD meibomian gland dysfunction; ATs artificial tears; PFAT's preservative free artificial tears; NSC nuclear sclerotic cataract; PSC posterior subcapsular cataract; ERM epi-retinal membrane; PVD posterior vitreous detachment; RD retinal detachment; DM diabetes mellitus; DR diabetic retinopathy; NPDR non-proliferative diabetic retinopathy; PDR proliferative diabetic retinopathy; CSME clinically significant macular edema; DME diabetic macular edema; dbh dot blot hemorrhages; CWS cotton wool spot; POAG primary open angle glaucoma; C/D cup-to-disc ratio; HVF humphrey visual field; GVF goldmann visual field; OCT optical coherence tomography; IOP intraocular pressure; BRVO Branch retinal vein occlusion; CRVO central retinal vein occlusion; CRAO central retinal artery occlusion; BRAO branch retinal artery occlusion; RT retinal tear; SB scleral buckle; PPV pars plana vitrectomy; VH Vitreous hemorrhage; PRP panretinal laser photocoagulation; IVK intravitreal kenalog; VMT vitreomacular traction; MH Macular hole;  NVD neovascularization of the disc; NVE neovascularization  elsewhere; AREDS age related eye disease study; ARMD age related macular degeneration; POAG primary open angle glaucoma; EBMD epithelial/anterior basement membrane dystrophy; ACIOL anterior chamber intraocular lens; IOL intraocular lens; PCIOL posterior chamber intraocular lens; Phaco/IOL phacoemulsification with intraocular lens placement; PRK photorefractive keratectomy; LASIK laser assisted in situ keratomileusis; HTN hypertension; DM diabetes mellitus; COPD chronic obstructive pulmonary disease

## 2019-12-30 ENCOUNTER — Encounter (INDEPENDENT_AMBULATORY_CARE_PROVIDER_SITE_OTHER): Payer: Self-pay | Admitting: Ophthalmology

## 2020-01-01 ENCOUNTER — Ambulatory Visit: Payer: Medicaid Other | Attending: Internal Medicine

## 2020-01-01 ENCOUNTER — Ambulatory Visit (INDEPENDENT_AMBULATORY_CARE_PROVIDER_SITE_OTHER): Payer: Self-pay | Admitting: Ophthalmology

## 2020-01-01 ENCOUNTER — Other Ambulatory Visit: Payer: Self-pay

## 2020-01-01 ENCOUNTER — Encounter (INDEPENDENT_AMBULATORY_CARE_PROVIDER_SITE_OTHER): Payer: Self-pay | Admitting: Ophthalmology

## 2020-01-01 DIAGNOSIS — H35373 Puckering of macula, bilateral: Secondary | ICD-10-CM

## 2020-01-01 DIAGNOSIS — Z23 Encounter for immunization: Secondary | ICD-10-CM

## 2020-01-01 DIAGNOSIS — H3581 Retinal edema: Secondary | ICD-10-CM

## 2020-01-01 DIAGNOSIS — H2000 Unspecified acute and subacute iridocyclitis: Secondary | ICD-10-CM

## 2020-01-01 DIAGNOSIS — H35103 Retinopathy of prematurity, unspecified, bilateral: Secondary | ICD-10-CM

## 2020-01-01 NOTE — Progress Notes (Signed)
[facility] Note  01/01/2020     CHIEF COMPLAINT Patient presents for Retina Follow Up   HISTORY OF PRESENT ILLNESS: Franklin [name] is a 24 y.o. male who presents to the clinic today for:   HPI    Retina Follow Up    Patient presents with  Other.  In left eye.  This started 3 days ago.  Severity is moderate.  I, the attending physician,  performed the HPI with the patient and updated documentation appropriately.          Comments    Patient here for 3 days retina follow up for conjuct. erythem OS. Patient states vision gotten better. No eye pain. Using drops. Used them this am.        Last edited by Bernarda Caffey, MD on 01/01/2020 12:37 PM. (History)    pt states he has been using the drips and ibuprofen as directed, he states his vision has gotten better as well as the pain and light sensitivity  Referring physician: No referring provider defined for this encounter.  HISTORICAL INFORMATION:   Selected notes from the [place] ED f/u for conjunctival erythema   CURRENT MEDICATIONS: Current Outpatient Medications (Ophthalmic Drugs)  Medication Sig  . erythromycin ophthalmic ointment Place 1 application into the left eye 4 (four) times daily for 7 days. Use ~1cm ribbon with each application  . ketorolac (ACULAR) 0.5 % ophthalmic solution Place 1 drop into the left eye 4 (four) times daily.  . prednisoLONE acetate (PRED FORTE) 1 % ophthalmic suspension Place 1 drop into the left eye every hour.   No current facility-administered medications for this visit. (Ophthalmic Drugs)   Current Outpatient Medications (Other)  Medication Sig  . acetaminophen (TYLENOL) 325 MG tablet Take 2 tablets (650 mg total) by mouth every 4 (four) hours as needed for mild pain (or temp >/= 99.5). (Patient not taking: Reported on 06/15/2019)  . feeding supplement, ENSURE ENLIVE, (ENSURE ENLIVE) LIQD Take 237 mLs by mouth 2 (two) times daily between meals.  (Patient not taking: Reported on 12/14/2019)  . ibuprofen (ADVIL) 400 MG tablet Take 400 mg by mouth every 6 (six) hours as needed for fever or moderate pain.  Marland Kitchen LAMICTAL XR 100 MG TB24 24 hour tablet Take 1 tablet every morning along with Lamictal XR 250mg   . LAMICTAL XR 200 MG TB24 24 hour tablet Take 1 tablet at night along with a Lamictal XR 300mg  tablet  . LAMICTAL XR 250 MG TB24 24 hour tablet Take 1 tablet in the morning along with Lamictal XR 100mg   . LAMICTAL XR 300 MG TB24 24 hour tablet Take 1 tablet at night along with Lamictal XR 200mg   . levETIRAcetam (KEPPRA) 500 MG tablet Take 1 tablet by mouth twice daily  . Magnesium Oxide 400 (240 Mg) MG TABS Take 1 tablet (400 mg total) by mouth daily. (Patient not taking: Reported on 06/15/2019)  . potassium chloride SA (K-DUR) 20 MEQ tablet Take 2 tablets (40 mEq total) by mouth every Monday, Wednesday, and Friday. (Patient not taking: Reported on 06/15/2019)   No current facility-administered medications for this visit. (Other)      REVIEW OF SYSTEMS: ROS    Positive for: Eyes   Last edited by Theodore Demark, COA on 01/01/2020  9:27 AM. (History)       ALLERGIES Allergies  Allergen Reactions  . Concerta [Methylphenidate] Other (See Comments)    Would start crying for little things  like when someone coughed or said a specific word for example.     PAST MEDICAL HISTORY Past Medical History:  Diagnosis Date  . Prematurity   . Retinopathy of prematurity   . Seizures (HCC)    Past Surgical History:  Procedure Laterality Date  . Central line placement     As premature infant  . REFRACTIVE SURGERY     For retinopathy of prematurity    FAMILY HISTORY Family History  Problem Relation Age of Onset  . Diabetes type II Father   . Hypercholesterolemia Father   . Hypertension Father   . Stroke Paternal Grandmother   . Autism Brother        Also has cognitive delays  . Stroke Paternal Aunt     SOCIAL HISTORY Social  History   Tobacco Use  . Smoking status: Never Smoker  . Smokeless tobacco: Never Used  Substance Use Topics  . Alcohol use: No  . Drug use: No         OPHTHALMIC EXAM:  Base Eye Exam    Visual Acuity (Snellen - Linear)      Right Left   Dist cc 20/40 -1 20/80   Dist ph cc NI    Correction: Glasses       Tonometry (Tonopen, 9:22 AM)      Right Left   Pressure 13 11       Pupils      Dark Light Shape React APD   Right 2 1 Round Minimal None   Left 5 5 Round 0 None       Visual Fields      Left Right   Restrictions Partial outer superior temporal, inferior temporal deficiencies Partial outer inferior nasal deficiency       Extraocular Movement      Right Left    Full, Ortho Full, Ortho       Neuro/Psych    Oriented x3: Yes   Mood/Affect: Normal       Dilation    Both eyes: 1.0% Mydriacyl, 2.5% Phenylephrine @ 9:22 AM        Slit Lamp and Fundus Exam    External Exam      Right Left   External  epiphora -- improved       Slit Lamp Exam      Right Left   Lids/Lashes Meibomian gland dysfunction, Scurf Mild Meibomian gland dysfunction;   Conjunctiva/Sclera White and quiet 3+ Injection    Cornea Clear Clear; high tear lake; no KP, mild endo pigment   Anterior Chamber Deep, 2+pigment Deep, 3-4+cell/pigment, fibrin ball resolved   Iris Round and dilated dilated, +PS at 1200   Lens 2+ Posterior subcapsular cataract 1+NS, 1+CC, 1-2+ASC, diffuse pigment deposition on anterior capsule   Vitreous Normal Normal       Fundus Exam      Right Left   Disc Mild Pallor, Sharp rim hazy view, Pink and Sharp   C/D Ratio  0.1   Macula Flat, Good foveal reflex, mild Epiretinal membrane, RPE mottling and clumping, incomplete development due to ROP haxy view, grossly flat, Blunted foveal reflex   Vessels Vascular attenuation, Tortuous Vascular attenuation, +ROP   Periphery Attached, 360 laser scarring    Attached, 360 CR scarring        Refraction    Wearing Rx       Sphere Cylinder Axis   Right -7.75 +1.75 058   Left -5.50 +1.25 136  IMAGING AND PROCEDURES  Imaging and Procedures for @TODAY @  OCT, Retina - OU - Both Eyes       Right Eye Quality was good. Central Foveal Thickness: 352. Progression has been stable. Findings include abnormal foveal contour, epiretinal membrane, no IRF, no SRF (Peripheral ORA caught on widefield).   Left Eye Quality was borderline. Central Foveal Thickness: 392. Progression has been stable. Findings include abnormal foveal contour, epiretinal membrane, no IRF, no SRF (ERM with central thickening).   Notes *Images captured and stored on drive  Diagnosis / Impression:  OD: hx of ROP with peripheral ORA, +ERM OS: ERM with central thickening  Clinical management:  See below  Abbreviations: NFP - Normal foveal profile. CME - cystoid macular edema. PED - pigment epithelial detachment. IRF - intraretinal fluid. SRF - subretinal fluid. EZ - ellipsoid zone. ERM - epiretinal membrane. ORA - outer retinal atrophy. ORT - outer retinal tubulation. SRHM - subretinal hyper-reflective material                 ASSESSMENT/PLAN:    ICD-10-CM   1. Acute anterior uveitis  H20.00   2. Retinopathy of prematurity of both eyes  H35.103   3. Epiretinal membrane (ERM) of both eyes  H35.373   4. Retinal edema  H35.81 OCT, Retina - OU - Both Eyes    1. Anterior uveitis OS -- improving  - 1 wk history of eye pain, redness, tearing, photophobia  - pt denies prior episodes  - exam shows 3-4+ AC cell/pigment w/ fibrin ball resolved today; 3+ conj injxn; +posterior synechiae  - cont PF Q1H OS  - cont Ketorolac QID OS  - contAtropine BID OS  - cont po Ibuprofen 600mg  TID  - f/u Thursday, April 29, 9:30AM  2. History of ROP  - 360 CR scarring OU -- laser vs cryo  - VA 20/40 OD, 20/80 OS (decreased OS due to ant uveitis? Vs baseline VA)  - monitor   3,4. Mild ERM OU  - asymptomatic, no metamorphopsia  -  no indication for surgery at this time  - monitor for now   Ophthalmic Meds Ordered this visit:  No orders of the defined types were placed in this encounter.      Return in about 3 days (around 01/04/2020) for f/u anterior uveitis OS, DFE, OCT.  There are no Patient Instructions on file for this visit.   Explained the diagnoses, plan, and follow up with the patient and they expressed understanding.  Patient expressed understanding of the importance of proper follow up care.   This document serves as a record of services personally performed by 08-21-1990, MD, PhD. It was created on their behalf by 01/06/2020, OA, an ophthalmic assistant. The creation of this record is the provider's dictation and/or activities during the visit.    Electronically signed by: Karie Chimera, OA 04.26.2021 12:39 PM .  Laurian Brim, M.D., Ph.D. Diseases & Surgery of the Retina and Vitreous [facility]  I have reviewed the above documentation for accuracy and completeness, and I agree with the above. Karie Chimera, M.D., Ph.D. 01/01/20 12:39 PM   Abbreviations: M myopia (nearsighted); A astigmatism; H hyperopia (farsighted); P presbyopia; Mrx spectacle prescription;  CTL contact lenses; OD right eye; OS left eye; OU both eyes  XT exotropia; ET esotropia; PEK punctate epithelial keratitis; PEE punctate epithelial erosions; DES dry eye syndrome; MGD meibomian gland dysfunction; ATs artificial tears; PFAT's preservative free artificial tears;  NSC nuclear sclerotic cataract; PSC posterior subcapsular cataract; ERM epi-retinal membrane; PVD posterior vitreous detachment; RD retinal detachment; DM diabetes mellitus; DR diabetic retinopathy; NPDR non-proliferative diabetic retinopathy; PDR proliferative diabetic retinopathy; CSME clinically significant macular edema; DME diabetic macular edema; dbh dot blot hemorrhages; CWS cotton wool spot; POAG primary open angle glaucoma; C/D  cup-to-disc ratio; HVF humphrey visual field; GVF goldmann visual field; OCT optical coherence tomography; IOP intraocular pressure; BRVO Branch retinal vein occlusion; CRVO central retinal vein occlusion; CRAO central retinal artery occlusion; BRAO branch retinal artery occlusion; RT retinal tear; SB scleral buckle; PPV pars plana vitrectomy; VH Vitreous hemorrhage; PRP panretinal laser photocoagulation; IVK intravitreal kenalog; VMT vitreomacular traction; MH Macular hole;  NVD neovascularization of the disc; NVE neovascularization elsewhere; AREDS age related eye disease study; ARMD age related macular degeneration; POAG primary open angle glaucoma; EBMD epithelial/anterior basement membrane dystrophy; ACIOL anterior chamber intraocular lens; IOL intraocular lens; PCIOL posterior chamber intraocular lens; Phaco/IOL phacoemulsification with intraocular lens placement; PRK photorefractive keratectomy; LASIK laser assisted in situ keratomileusis; HTN hypertension; DM diabetes mellitus; COPD chronic obstructive pulmonary disease

## 2020-01-03 NOTE — Progress Notes (Shared)
?  [facility] - Clinic Note ? ?01/04/2020 ? ?  ? ?CHIEF COMPLAINT ?Patient presents for No chief complaint on file. ? ? ?HISTORY OF PRESENT ILLNESS: ?Franklin [name] is a 24 y.o. male who presents to the clinic today for:  ? ?pt states he has been using the drips and ibuprofen as directed, he states his vision has gotten better as well as the pain and light sensitivity ? ?Referring physician: ?No referring provider defined for this encounter. ? ?HISTORICAL INFORMATION:  ? ?Selected notes from the MEDICAL RECORD NUMBER ?ED f/u for conjunctival erythema  ? ?CURRENT MEDICATIONS: ?Current Outpatient Medications (Ophthalmic Drugs)  ?Medication Sig  ?? erythromycin ophthalmic ointment Place 1 application into the left eye 4 (four) times daily for 7 days. Use ~1cm ribbon with each application  ?? ketorolac (ACULAR) 0.5 % ophthalmic solution Place 1 drop into the left eye 4 (four) times daily.  ?? prednisoLONE acetate (PRED FORTE) 1 % ophthalmic suspension Place 1 drop into the left eye every hour.  ? ?No current facility-administered medications for this visit. (Ophthalmic Drugs)  ? ?Current Outpatient Medications (Other)  ?Medication Sig  ?? acetaminophen (TYLENOL) 325 MG tablet Take 2 tablets (650 mg total) by mouth every 4 (four) hours as needed for mild pain (or temp >/= 99.5). (Patient not taking: Reported on 06/15/2019)  ?? feeding supplement, ENSURE ENLIVE, (ENSURE ENLIVE) LIQD Take 237 mLs by mouth 2 (two) times daily between meals. (Patient not taking: Reported on 12/14/2019)  ?? ibuprofen (ADVIL) 400 MG tablet Take 400 mg by mouth every 6 (six) hours as needed for fever or moderate pain.  ?? LAMICTAL XR 100 MG TB24 24 hour tablet Take 1 tablet every morning along with Lamictal XR 250mg   ?? LAMICTAL XR 200 MG TB24 24 hour tablet Take 1 tablet at night along with a Lamictal XR 300mg  tablet  ?? LAMICTAL XR 250 MG TB24 24 hour tablet Take 1 tablet in the morning along with Lamictal XR 100mg   ? ? LAMICTAL XR 300 MG TB24 24 hour tablet Take 1 tablet at nig

## 2020-01-04 ENCOUNTER — Encounter (INDEPENDENT_AMBULATORY_CARE_PROVIDER_SITE_OTHER): Payer: Self-pay | Admitting: Ophthalmology

## 2020-01-10 ENCOUNTER — Encounter (INDEPENDENT_AMBULATORY_CARE_PROVIDER_SITE_OTHER): Payer: Medicaid Other | Admitting: Ophthalmology

## 2020-01-11 ENCOUNTER — Other Ambulatory Visit (INDEPENDENT_AMBULATORY_CARE_PROVIDER_SITE_OTHER): Payer: Self-pay | Admitting: Family

## 2020-01-11 DIAGNOSIS — G40309 Generalized idiopathic epilepsy and epileptic syndromes, not intractable, without status epilepticus: Secondary | ICD-10-CM

## 2020-02-11 ENCOUNTER — Other Ambulatory Visit (INDEPENDENT_AMBULATORY_CARE_PROVIDER_SITE_OTHER): Payer: Self-pay | Admitting: Family

## 2020-02-11 DIAGNOSIS — G40309 Generalized idiopathic epilepsy and epileptic syndromes, not intractable, without status epilepticus: Secondary | ICD-10-CM

## 2020-03-04 ENCOUNTER — Telehealth (INDEPENDENT_AMBULATORY_CARE_PROVIDER_SITE_OTHER): Payer: Self-pay | Admitting: Family

## 2020-03-04 NOTE — Telephone Encounter (Signed)
°  Who's calling (name and relationship to patient) :Father / Artavius Stearns   Best contact number:[phone]  Provider they GUY:QIHK Goodpasture   Reason for call:Dad called to let clinic staff know that Clara had a seizure last night while [place] [employer] [place] food. He stated that he thinks its because he has missed 2 doses of his medication. Dad would like a call back. Please advise.      PRESCRIPTION REFILL ONLY  Name of prescription:  Pharmacy:

## 2020-03-04 NOTE — Telephone Encounter (Signed)
I called and talked to Dad. He said that while at a restaurant last night, Franklin [name] began falling backwards and he was able to catch him as the seizure began. He eased him to the floor and the seizure lasted a couple of minutes. Franklin [name] was not injured in the seizure. After they returned home, Dad realized that Franklin [name] had missed the morning dose of seizure meds yesterday and the evening dose the night before. I talked to Dad and explained that missing the doses lowered the threshold for seizures to occur and that I would not recommend changes in his treatment plan since he had been noncompliant. I asked Dad to let me know if Franklin [name] has more seizures. He is due for follow up in October 2021 but I am happy to see him sooner if needed. Dad agreed with the plans made today. TG

## 2020-03-18 ENCOUNTER — Other Ambulatory Visit (INDEPENDENT_AMBULATORY_CARE_PROVIDER_SITE_OTHER): Payer: Self-pay | Admitting: Family

## 2020-03-18 DIAGNOSIS — G40309 Generalized idiopathic epilepsy and epileptic syndromes, not intractable, without status epilepticus: Secondary | ICD-10-CM

## 2020-04-03 ENCOUNTER — Telehealth (INDEPENDENT_AMBULATORY_CARE_PROVIDER_SITE_OTHER): Payer: Self-pay | Admitting: Family

## 2020-04-03 DIAGNOSIS — G40309 Generalized idiopathic epilepsy and epileptic syndromes, not intractable, without status epilepticus: Secondary | ICD-10-CM

## 2020-04-03 MED ORDER — LAMICTAL XR 100 MG PO TB24: ORAL_TABLET | ORAL | 0 refills | Status: DC

## 2020-04-03 MED ORDER — LAMICTAL XR 250 MG PO TB24: ORAL_TABLET | ORAL | 0 refills | Status: DC

## 2020-04-03 MED ORDER — LAMICTAL XR 200 MG PO TB24: ORAL_TABLET | ORAL | 0 refills | Status: DC

## 2020-04-03 MED ORDER — LAMICTAL XR 300 MG PO TB24: ORAL_TABLET | ORAL | 0 refills | Status: DC

## 2020-04-03 NOTE — Telephone Encounter (Signed)
°  Who's calling (name and relationship to patient) : [name] ( dad)  Best contact number: [phone]  Provider they see: Elveria Rising  Reason for call: Patient just informed dad he took the last of medication last night. He needs a refill and dad said he also needs to be recertified with his paperwork. I made the patient an appointment because it appears to have been awhile since his last time seeing Inetta Fermo.     PRESCRIPTION REFILL ONLY  Name of prescription: Lamictal  Pharmacy: [facility] [address]

## 2020-04-03 NOTE — Telephone Encounter (Signed)
  Who's calling (name and relationship to patient) : Milly Jakob contact number: [phone]  Provider they see: Elveria Rising  Reason for call: Dad calling back to see if prescription has been sent and make sure it is sent to the speciality pharmacy that deals with payment assistance. He also needs to be re certified for that assistance and dad would like to speak to [facility] please     PRESCRIPTION REFILL ONLY  Name of prescription:  Pharmacy:

## 2020-04-03 NOTE — Telephone Encounter (Signed)
Please send to the pharmacy if applicable. The patient was not due for an appointment until October

## 2020-04-03 NOTE — Telephone Encounter (Signed)
I called and spoke to Franklin [name]. Franklin [name]'s patient assistance program through [employer] expired July 1st. He took his last Lamictal this morning. The family is unable to pay for an emergency supply of this medication. I recommended to Franklin [name] that Franklin [name] increase the Levetiracetam 500mg  to 2 tablets twice per day for now but cautioned him that Franklin [name] may have seizures because of not taking the Lamictal. I asked Franklin [name] to come in tomorrow to sign the patient assistance form for [employer] and I will work on getting that re-authorized for him. Franklin [name] agreed with this plan. TG

## 2020-04-04 MED ORDER — LAMICTAL XR 250 MG PO TB24: ORAL_TABLET | ORAL | 3 refills | Status: DC

## 2020-04-04 MED ORDER — LAMICTAL XR 300 MG PO TB24: ORAL_TABLET | ORAL | 3 refills | Status: DC

## 2020-04-04 MED ORDER — LAMICTAL XR 200 MG PO TB24: ORAL_TABLET | ORAL | 3 refills | Status: DC

## 2020-04-04 MED ORDER — LAMICTAL XR 100 MG PO TB24: ORAL_TABLET | ORAL | 3 refills | Status: DC

## 2020-04-04 NOTE — Telephone Encounter (Signed)
Franklin [name] came to the office and signed the [employer] form for [facility]. The form was faxed to [employer] Patient Assistance Program. TG

## 2020-04-04 NOTE — Addendum Note (Signed)
Addended by: Princella Ion on: 04/04/2020 10:24 AM   Modules accepted: Orders

## 2020-04-08 ENCOUNTER — Ambulatory Visit (INDEPENDENT_AMBULATORY_CARE_PROVIDER_SITE_OTHER): Payer: Self-pay | Admitting: Family

## 2020-04-08 ENCOUNTER — Encounter (INDEPENDENT_AMBULATORY_CARE_PROVIDER_SITE_OTHER): Payer: Self-pay | Admitting: Family

## 2020-04-08 ENCOUNTER — Other Ambulatory Visit: Payer: Self-pay

## 2020-04-08 VITALS — BP 116/66 | HR 80 | Ht 68.43 in | Wt 117.0 lb

## 2020-04-08 DIAGNOSIS — Z87898 Personal history of other specified conditions: Secondary | ICD-10-CM

## 2020-04-08 DIAGNOSIS — R634 Abnormal weight loss: Secondary | ICD-10-CM

## 2020-04-08 DIAGNOSIS — G40309 Generalized idiopathic epilepsy and epileptic syndromes, not intractable, without status epilepticus: Secondary | ICD-10-CM

## 2020-04-08 DIAGNOSIS — F411 Generalized anxiety disorder: Secondary | ICD-10-CM

## 2020-04-08 DIAGNOSIS — F819 Developmental disorder of scholastic skills, unspecified: Secondary | ICD-10-CM

## 2020-04-08 MED ORDER — LEVETIRACETAM 500 MG PO TABS: 1000.0000 mg | ORAL_TABLET | Freq: Two times a day (BID) | ORAL | 5 refills | Status: DC

## 2020-04-08 NOTE — Patient Instructions (Addendum)
Thank you for coming in today.   Instructions for you until your next appointment are as follows: 1. I will refer you to Dr Huntley Dec, a psychologist in this office to help with anxiety  2. Continue to take Levetiracetam (Keppra) 500mg  - 2 tablets in the morning and 2 tablets at night 3. Let me know if you hear from the patient assistance program for Lamictal 4. I am concerned about your weight loss. Try to work on eating regular meals each day 5. Please sign up for MyChart if you have not done so 6. Please plan to return for follow up in 6 months or sooner if needed.   Wt Readings from Last 3 Encounters:  04/08/20 117 lb (53.1 kg)  12/11/19 130 lb (59 kg)  06/15/19 137 lb (62.1 kg)

## 2020-04-08 NOTE — Progress Notes (Signed)
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 He is taking and tolerating Lamictal XR that he receives from [employer] Patient Assistance Program and Levetiracetam that he purchases from the pharmacy. He has no insurance coverage.   Today's concerns: Franklin Franklin [name] and his Franklin [name] report that he has had no seizures since his last visit. He tends to have seizures when he misses medication doses and when he is sleep deprived. A few days ago, Franklin Franklin [name] ran out of the Lamictal XR that is provided by the [employer] patient assistance program and the Levetiracetam dose was increased while waiting to get the Lamictal XR reinstated. He has tolerated the Levetiracetam increase well and has remained seizure free.   Franklin Franklin [name] is quite thin and has last 20 lbs since his last visit. He admits to anxiety and not having any appetite because he worries about being homeless. He and his Franklin [name] live in a relative's home that is being sold, and they do not have a plan of where to go when the house sells. Dad has tried to reassure Franklin Franklin [name] but he continues to be very anxious and is not eating much each day.   Franklin [name] has been otherwise generally healthy since he was last seen. Neither he nor his Franklin [name] have other health concerns for him today other than previously mentioned.  Review of systems: Please see HPI for neurologic and other pertinent review of systems. Otherwise all other systems were reviewed and were negative.  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 He was tried on Concerta which caused significant emotional disturbance, then on Ritalin, then Adderall which worked well. He has mild hearing loss.  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   Birth History: He was born at [redacted] weeks gestation, weighing 1 lb 13 1/2 oz. He required ventilator support for sometime. He reportedly had no CNS hemorrhage. Growth and development was delayed.  Surgical history: Past Surgical History:  Procedure Laterality Date   Central line placement     As premature infant   REFRACTIVE SURGERY     For retinopathy of prematurity    Family history: family history includes Autism in his brother; Diabetes type II in his Franklin [name]; Hypercholesterolemia in his Franklin [name]; Hypertension in his Franklin [name]; Stroke in his paternal aunt and paternal grandmother.   Social history: Social History  Socioeconomic History   Marital status: Single    Spouse name: Not on file   Number of children: Not on file   Years of education: Not on file   Highest education level: Not on file  Occupational History   Not on file  Tobacco Use   Smoking status: Never Smoker   Smokeless tobacco: Never Used  Substance and Sexual Activity   Alcohol use:  No   Drug use: No   Sexual activity: Not Currently  Other Topics Concern   Not on file  Social History Narrative   Parents are divorced. Franklin Franklin [name] lives with his Franklin [name].   Social Determinants of Health   Financial Resource Strain: Low Risk    Difficulty of Paying Living Expenses: Not hard at all  Food Insecurity: Food Insecurity Present   Worried About Programme researcher, broadcasting/film/video in the Last Year: Never true   Barista in the Last Year: Sometimes true  Transportation Needs: No Transportation Needs   Lack of Transportation (Medical): No   Lack of Transportation (Non-Medical): No  Physical Activity: Sufficiently Active   Days of Exercise per Week: 7 days   Minutes of Exercise per Session: 30 min  Stress: Stress Concern Present   Feeling of Stress : To some extent  Social Connections: Socially Isolated   Frequency of Communication with Friends and Family: Once a week   Frequency of Social Gatherings with Friends and Family: Once a week   Attends Religious Services: Never   Database administrator or Organizations: No   Attends Engineer, structural: Never   Marital Status: Never married  Catering manager Violence: Not At Risk   Fear of Current or Ex-Partner: No   Emotionally Abused: No   Physically Abused: No   Sexually Abused: No    Past/failed meds:  Allergies: Allergies  Allergen Reactions   Concerta [Methylphenidate] Other (See Comments)    Would start crying for little things like when someone coughed or said a specific word for example.       Immunizations: Immunization History  Administered Date(s) Administered   PFIZER SARS-COV-2 Vaccination 12/08/2019, 01/01/2020   Tdap 06/30/2015, 09/14/2018    Diagnostics/Screenings: EEG in 2006 - generalized convulsive epilepsy  Physical Exam: BP 116/66    Pulse 80    Ht 5' 8.43" (1.738 m)    Wt 117 lb (53.1 kg)    BMI 17.57 kg/m   General: thin but well developed young man, seated on exam  table, in no evident distress; brown hair, brown eyes, right handed Head: normocephalic and atraumatic. Oropharynx benign. No dysmorphic features. Neck: supple. No focal tenderness. Cardiovascular: regular rate and rhythm, no murmurs. Respiratory: Clear to auscultation bilaterally Abdomen: Bowel sounds present all four quadrants, abdomen soft, non-tender, non-distended. Musculoskeletal: No skeletal deformities or obvious scoliosis Skin: no rashes or neurocutaneous lesions  Neurologic Exam Mental Status: Awake and fully alert.  Attention span and concentration appropriate for age.  Fund of knowledge subnormal for age. Speech fluent without dysarthria.  Able to follow commands and participate in examination. Cranial Nerves: Fundoscopic exam - red reflex present.  Unable to fully visualize fundus.  Pupils equal briskly reactive to light.  Extraocular movements full without nystagmus.  Visual fields full to confrontation.  Hearing intact and symmetric to finger rub.  Facial sensation intact.  Face, tongue, palate move normally and symmetrically.  Neck flexion and extension normal. Motor: Normal bulk and tone.  Normal strength in all tested extremity muscles. Sensory:  Intact to touch and temperature in all extremities. Coordination: Rapid movements: finger and toe tapping normal and symmetric bilaterally.  Finger-to-nose and heel-to-shin intact bilaterally.  Able to balance on either foot. Romberg negative. Gait and Station: Arises from chair, without difficulty. Stance is normal.  Gait demonstrates normal stride length and balance. Able to walk normally. Able to heel, toe and tandem walk without difficulty. Reflexes: Diminished and symmetric. Toes downgoing. No clonus.  Impression: 1. Generalized convulsive epilepsy 2. Intellectual delay 3. History of prematurity 4. Anxiety 5. Weight loss  Recommendations for plan of care: [facility]. Franklin HaileyDustin has neither  had nor required imaging or lab studies since the last visit. He is a 24 year old man with history of prematurity, generalized convulsive epilepsy, intellectual delay, anxiety and weight loss. He has been prescribed Lamictal XR that he receives from a patient assistance program but is out of the medication at this time. We are working to get the medication reinstated. He is also taking and tolerating Levetiracetam and has remained seizure free on this medication despite being out of Lamictal XR for the last few days. I talked with Franklin HaileyDustin and his Franklin [name] about that and told them that we may consider tapering the Lamictal XR in the future since the Levetiracetam is working to control seizures. I also talked with Franklin Franklin [name] about considering guardianship for him since Franklin HaileyDustin has problems with memory and processing information.   Franklin HaileyDustin has lost 20 lbs since his last visit and admits to anxiety and having a poor appetite. I talked with him about trying to not skip meals and referred him to Dr [facility] Decupito [facility] for help with anxiety.   Wt Readings from Last 3 Encounters:  04/08/20 117 lb (53.1 kg)  12/11/19 130 lb (59 kg)  06/15/19 137 lb (62.1 kg)    I will see him back in follow up in 6 months or sooner if needed. Franklin HaileyDustin and his Franklin [name] agreed with the plans made today.   The medication list was reviewed and reconciled. No changes were made in the prescribed medications today. A complete medication list was provided to the patient.  Allergies as of 04/08/2020      Reactions   Concerta [methylphenidate] Other (See Comments)   Would start crying for little things like when someone coughed or said a specific word for example.       Medication List       Accurate as of April 08, 2020 11:59 PM. If you have any questions, ask your nurse or doctor.        acetaminophen 325 MG tablet Commonly known as: TYLENOL Take 2 tablets (650 mg total) by mouth every 4 (four) hours as  needed for mild pain (or temp >/= 99.5).   feeding supplement (ENSURE ENLIVE) Liqd Take 237 mLs by mouth 2 (two) times daily between meals.   ibuprofen 400 MG tablet Commonly known as: ADVIL Take 400 mg by mouth every 6 (six) hours as needed for fever or moderate pain.   ketorolac 0.5 % ophthalmic solution Commonly known as: ACULAR Place 1 drop into the left eye 4 (four) times daily.   LaMICtal XR 100 MG Tb24 24 hour tablet Generic drug: LamoTRIgine Take 1 tablet every morning along with Lamictal XR 250mg    LaMICtal XR 200 MG Tb24 24 hour tablet Generic drug: LamoTRIgine Take 1 tablet at night along with a Lamictal XR 300mg  tablet   LaMICtal XR 250 MG Tb24  24 hour tablet Generic drug: LamoTRIgine Take 1 tablet in the morning along with Lamictal XR 100mg    LaMICtal XR 300 MG Tb24 24 hour tablet Generic drug: LamoTRIgine Take 1 tablet at night along with Lamictal XR 200mg    levETIRAcetam 500 MG tablet Commonly known as: KEPPRA Take 2 tablets (1,000 mg total) by mouth 2 (two) times daily. What changed: how much to take Changed by: , NP   Magnesium Oxide 400 (240 Mg) MG Tabs Take 1 tablet (400 mg total) by mouth daily.   potassium chloride SA 20 MEQ tablet Commonly known as: KLOR-CON Take 2 tablets (40 mEq total) by mouth every Monday, Wednesday, and Friday.   prednisoLONE acetate 1 % ophthalmic suspension Commonly known as: PRED FORTE Place 1 drop into the left eye every hour.       I consulted with Dr Saturday regarding this patient.  Total time spent with the patient was 25 minutes, of which 50% or more was spent in counseling and coordination of care.  Friday NP-C [facility] Ph. [phone] Fax [phone]

## 2020-04-09 ENCOUNTER — Telehealth (INDEPENDENT_AMBULATORY_CARE_PROVIDER_SITE_OTHER): Payer: Self-pay | Admitting: Family

## 2020-04-09 DIAGNOSIS — G40309 Generalized idiopathic epilepsy and epileptic syndromes, not intractable, without status epilepticus: Secondary | ICD-10-CM

## 2020-04-09 MED ORDER — LEVETIRACETAM 500 MG PO TABS: 1000.0000 mg | ORAL_TABLET | Freq: Two times a day (BID) | ORAL | 5 refills | Status: DC

## 2020-04-09 NOTE — Telephone Encounter (Signed)
Dad called to f/u on this request.  He spoke to payment assistance and they have not received this form yet.  Please call.

## 2020-04-09 NOTE — Telephone Encounter (Signed)
  Who's calling (name and relationship to patient) : [name] (dad)  Best contact number: [phone]  Provider they see: Elveria Rising  Reason for call: Dad states that RX for Keppra was supposed to be sent to [facility] but it was sent to [facility] instead. He is requesting that it be sent to [facility].    PRESCRIPTION REFILL ONLY  Name of prescription: levETIRAcetam (KEPPRA) 500 MG tablet  Pharmacy: [facility] [address], [place] - [address]

## 2020-04-09 NOTE — Telephone Encounter (Signed)
Rx has been sent to the [facility]

## 2020-04-09 NOTE — Telephone Encounter (Signed)
I called Dad and let him know that I re-faxed the [employer] document today. TG

## 2020-04-10 ENCOUNTER — Encounter (INDEPENDENT_AMBULATORY_CARE_PROVIDER_SITE_OTHER): Payer: Self-pay | Admitting: Family

## 2020-04-10 DIAGNOSIS — F411 Generalized anxiety disorder: Secondary | ICD-10-CM | POA: Insufficient documentation

## 2020-04-10 DIAGNOSIS — R634 Abnormal weight loss: Secondary | ICD-10-CM | POA: Insufficient documentation

## 2020-04-11 NOTE — Telephone Encounter (Signed)
I called [employer] Patient Assistance Program to check on status of Franklin [name]'s application. I was told that he was approved and that the Lamictal XR was shipped by [employer] yesterday. I called Maddax's Dad to let him know. TG

## 2020-04-25 ENCOUNTER — Ambulatory Visit (INDEPENDENT_AMBULATORY_CARE_PROVIDER_SITE_OTHER): Payer: Self-pay | Admitting: Psychology

## 2020-04-25 ENCOUNTER — Other Ambulatory Visit: Payer: Self-pay

## 2020-04-25 DIAGNOSIS — G40309 Generalized idiopathic epilepsy and epileptic syndromes, not intractable, without status epilepticus: Secondary | ICD-10-CM

## 2020-04-25 DIAGNOSIS — F819 Developmental disorder of scholastic skills, unspecified: Secondary | ICD-10-CM

## 2020-04-25 DIAGNOSIS — Z87898 Personal history of other specified conditions: Secondary | ICD-10-CM

## 2020-04-25 DIAGNOSIS — F411 Generalized anxiety disorder: Secondary | ICD-10-CM
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
## 2020-05-16 ENCOUNTER — Ambulatory Visit (INDEPENDENT_AMBULATORY_CARE_PROVIDER_SITE_OTHER): Payer: Self-pay | Admitting: Psychology

## 2020-05-16 DIAGNOSIS — F819 Developmental disorder of scholastic skills, unspecified: Secondary | ICD-10-CM

## 2020-05-16 DIAGNOSIS — F411 Generalized anxiety disorder: Secondary | ICD-10-CM

## 2020-05-16 NOTE — BH Specialist Note (Signed)
Integrated Behavioral Health via Telemedicine Video [facility]) Visit  05/16/2020 AABAN GRIEP [id number]  Number of Integrated Behavioral Health visits: 2/6 Session Start time: 1:40 PM  Session End time: 2:00 PM Total time: 20 minutes  Referring Provider: Elveria Rising, NP Type of Service: Individual, Family, Etc. Patient/Family location: patient's home [facility] Provider location: [facility] All persons participating in visit: patient and his father  Discussed confidentiality: Yes   I connected with [name] and/or Kazuto Sevey Rijo's father by a video enabled telemedicine application ([employer]) and verified that I am speaking with the correct person using two identifiers.    I discussed that engaging in this virtual visit, they consent to the provision of behavioral healthcare and the services will be billed under their insurance.   Patient and/or legal guardian expressed understanding and consented to virtual visit: Yes   PRESENTING CONCERNS: Franklin [name] is a 24 y.o. male with history of generalized convulsive epilepsy. He has history of prematurity and intellectual disability accompanied by Father Patient was referred by Elveria Rising, NP for anxiety and picky eating. Patient reports the following symptoms/concerns: anxiety Duration of problem: years; Severity of problem: moderate   Karmello completed homework of practicing taking deep breaths and using this skill when stressed  Kendal is feeling less stressed overall.  He reports he spent time with family and was able to go to [employer].  Arshia reports that he has a headache today.  His cousin is sick and was exposed to covid.  His mom is staying away due to concerns of covid.  Jakeim reports that he misses seeing his mom.  STRENGTHS (Protective Factors/Coping Skills): Blue is kind and open to learning skills to better manage anxiety. ASSESSMENT: Patient currently experiencing significant anxiety  symptoms.  Odai has a tendency of keeping his emotions to himself and then lashing out in anger at family members.  He demonstrates poor emotion regulation when angry, yet is typically kind and caring.   Patient may benefit from learning skills to better manage emotions.  He would also benefit from learning about services available to him for housing given intellectual disabilities.   GOALS ADDRESSED: Patient will: 1.  Reduce symptoms of: anxiety   Progress of Goals: Ongoing; he is using deep breathing as needed and continues to struggle to manage anxiety effectively  INTERVENTIONS: Interventions utilized:  Brief CBT and Supportive Counseling  Helped Xavior process emotions related to family stress. Encouraged Thomes to open up to his family members as he tends to keep to himself. Standardized Assessments completed & reviewed: Not Needed   OUTCOME: Patient Response: Arath was cooperative and shared his emotions related to his family.  He reports understanding of the rationale for why it is helpful to open up to others.  He feels most comfortable going to his mother for emotion support.   PLAN: 1. Follow up with behavioral health clinician on : In December when Dr. Huntley Dec returns from leave 2. Behavioral recommendations: continue to use deep breathing; open up to family and friends more to utilize social support when stressed 3. Referral(s): Integrated [employer] (In Clinic)  I discussed the assessment and treatment plan with the patient and/or parent/guardian. They were provided an opportunity to ask questions and all were answered. They agreed with the plan and demonstrated an understanding of the instructions.   They were advised to call back or seek an in-person evaluation if the symptoms worsen or if the condition fails to improve as  anticipated.  I discussed the limitations of evaluation and management by telemedicine and the availability of in person  appointments.  I discussed that the purpose of this visit is to provide behavioral health care while limiting exposure to the novel coronavirus.   Discussed there is a possibility of technology failure and discussed alternative modes of communication if that failure occurs.  Lolah Coghlan

## 2020-06-12 ENCOUNTER — Ambulatory Visit (INDEPENDENT_AMBULATORY_CARE_PROVIDER_SITE_OTHER): Payer: Medicaid Other | Admitting: Family

## 2020-07-07 ENCOUNTER — Telehealth (INDEPENDENT_AMBULATORY_CARE_PROVIDER_SITE_OTHER): Payer: Self-pay | Admitting: Pediatrics

## 2020-07-07 MED ORDER — LEVETIRACETAM 500 MG PO TABS: ORAL_TABLET | ORAL | 6 refills | Status: DC

## 2020-07-07 NOTE — Telephone Encounter (Signed)
Mother called, patient woke up at dad's house with gashes on his face and confused, with a headache, they think he had a seizure. Mother thinks he scraped his face on his bed.  He has woken up with headache 3 times this week which is his usual sign of seizure. They deny any missed doses, he has been on his normal sleep routine, no illness.    I recommended increasing Keppra to 2 pills in morning, 3 pills in evening.  Continue Lamictal as prescribed.  I sent in a new prescription for the increased dose.   Regarding scratches, father reports they have all stopped bleeding and are superficial.  I advised to clean them and apply antibiotic ointment. I recommended follow-up with his PCP if they had continued concerns for the cuts, but he does not have one.  I advised not to go to the ED, if mother continues to have concerns for his skin this week advise they call our office back. Mother wants Inetta Fermo to know that father is out of minutes on his phone, so call mother if she wants to contact them further.   Lorenz Coaster MD MPH

## 2020-07-09 NOTE — Telephone Encounter (Signed)
I left a message for Mom and invited her to call back. TG 

## 2020-07-09 NOTE — Addendum Note (Signed)
Addended by: Princella Ion on: 07/09/2020 12:02 PM   Modules accepted: Orders

## 2020-07-29 ENCOUNTER — Telehealth (INDEPENDENT_AMBULATORY_CARE_PROVIDER_SITE_OTHER): Payer: Self-pay | Admitting: Family

## 2020-07-29 DIAGNOSIS — G40309 Generalized idiopathic epilepsy and epileptic syndromes, not intractable, without status epilepticus: Secondary | ICD-10-CM

## 2020-07-29 NOTE — Telephone Encounter (Signed)
  Who's calling (name and relationship to patient) :Dad/ Mendel Ryder  Best contact number:[phone]  Provider they XHB:ZJIR Goodpasture   Reason for call:Dad stated that Jahmeek had a seizure last night and wanted to call in and speak with someone about other medical questions that he has. Please advise.     PRESCRIPTION REFILL ONLY  Name of prescription:  Pharmacy:

## 2020-07-29 NOTE — Telephone Encounter (Signed)
I called and left a message for Dad, inviting him to call me back. TG

## 2020-07-30 MED ORDER — LEVETIRACETAM 500 MG PO TABS: ORAL_TABLET | ORAL | 6 refills | Status: DC

## 2020-07-30 NOTE — Telephone Encounter (Signed)
I called and spoke with Dad. He said that Franklin [name] had a seizure during sleep. A family member heard him and alerted his father. He said that Franklin [name] had not missed doses of medications and had been doing better with getting more sleep overall. I recommended increase in Levetiracetam to 3 tablets BID and asked Dad to continue to notify me when seizures occur. Dad agreed with the plans made today. TG

## 2020-08-05 ENCOUNTER — Emergency Department (HOSPITAL_COMMUNITY): Payer: Self-pay

## 2020-08-05 ENCOUNTER — Telehealth (INDEPENDENT_AMBULATORY_CARE_PROVIDER_SITE_OTHER): Payer: Self-pay | Admitting: Family

## 2020-08-05 ENCOUNTER — Other Ambulatory Visit: Payer: Self-pay

## 2020-08-05 ENCOUNTER — Encounter (HOSPITAL_COMMUNITY): Payer: Self-pay | Admitting: Emergency Medicine

## 2020-08-05 ENCOUNTER — Emergency Department (HOSPITAL_COMMUNITY)
Admission: EM | Admit: 2020-08-05 | Discharge: 2020-08-05 | Disposition: A | Discharge status: Home / Self Care | Payer: Self-pay | Attending: Emergency Medicine | Admitting: Emergency Medicine

## 2020-08-05 DIAGNOSIS — G40309 Generalized idiopathic epilepsy and epileptic syndromes, not intractable, without status epilepticus: Secondary | ICD-10-CM

## 2020-08-05 DIAGNOSIS — Z5989 Other problems related to housing and economic circumstances: Secondary | ICD-10-CM

## 2020-08-05 DIAGNOSIS — F819 Developmental disorder of scholastic skills, unspecified: Secondary | ICD-10-CM

## 2020-08-05 DIAGNOSIS — R569 Unspecified convulsions: Secondary | ICD-10-CM | POA: Insufficient documentation

## 2020-08-05 DIAGNOSIS — F411 Generalized anxiety disorder: Secondary | ICD-10-CM

## 2020-08-05 LAB — CBC
HCT: 43.9 % (ref 39.0–52.0)
Hemoglobin: 13.7 g/dL (ref 13.0–17.0)
MCH: 27.9 pg (ref 26.0–34.0)
MCHC: 31.2 g/dL (ref 30.0–36.0)
MCV: 89.4 fL (ref 80.0–100.0)
Platelets: 490 10*3/uL — ABNORMAL HIGH (ref 150–400)
RBC: 4.91 MIL/uL (ref 4.22–5.81)
RDW: 14.4 % (ref 11.5–15.5)
WBC: 8.4 10*3/uL (ref 4.0–10.5)
nRBC: 0 % (ref 0.0–0.2)

## 2020-08-05 LAB — HEPATIC FUNCTION PANEL
ALT: 94 U/L — ABNORMAL HIGH (ref 0–44)
AST: 82 U/L — ABNORMAL HIGH (ref 15–41)
Albumin: 3.8 g/dL (ref 3.5–5.0)
Alkaline Phosphatase: 1038 U/L — ABNORMAL HIGH (ref 38–126)
Bilirubin, Direct: 0.2 mg/dL (ref 0.0–0.2)
Indirect Bilirubin: 0.6 mg/dL (ref 0.3–0.9)
Total Bilirubin: 0.8 mg/dL (ref 0.3–1.2)
Total Protein: 7.4 g/dL (ref 6.5–8.1)

## 2020-08-05 LAB — LIPASE, BLOOD: Lipase: 17 U/L (ref 11–51)

## 2020-08-05 LAB — BASIC METABOLIC PANEL
Anion gap: 12 (ref 5–15)
BUN: 5 mg/dL — ABNORMAL LOW (ref 6–20)
CO2: 25 mmol/L (ref 22–32)
Calcium: 9.2 mg/dL (ref 8.9–10.3)
Chloride: 99 mmol/L (ref 98–111)
Creatinine, Ser: 0.77 mg/dL (ref 0.61–1.24)
GFR, Estimated: >60 mL/min (ref >60)
Glucose, Bld: 110 mg/dL — ABNORMAL HIGH (ref 70–99)
Potassium: 4.1 mmol/L (ref 3.5–5.1)
Sodium: 136 mmol/L (ref 135–145)

## 2020-08-05 LAB — CBG MONITORING, ED: Glucose-Capillary: 104 mg/dL — ABNORMAL HIGH (ref 70–99)

## 2020-08-05 LAB — ETHANOL: Alcohol, Ethyl (B): <10 mg/dL (ref <10)

## 2020-08-05 MED ORDER — LEVETIRACETAM 500 MG PO TABS: ORAL_TABLET | ORAL | 0 refills | Status: DC

## 2020-08-05 MED ORDER — ACETAMINOPHEN 500 MG PO TABS
1000.0000 mg | ORAL_TABLET | Freq: Once | ORAL | Status: AC
  Administered 2020-08-05: 1000 mg via ORAL
  Filled 2020-08-05: qty 2

## 2020-08-05 MED ORDER — ACETAMINOPHEN 325 MG PO TABS: 325.0000 mg | ORAL_TABLET | Freq: Once | ORAL | Status: DC

## 2020-08-05 MED ORDER — ONDANSETRON HCL 4 MG/2ML IJ SOLN
4.0000 mg | Freq: Once | INTRAMUSCULAR | Status: AC
  Administered 2020-08-05: 4 mg via INTRAVENOUS
  Filled 2020-08-05: qty 2

## 2020-08-05 MED ORDER — SODIUM CHLORIDE 0.9 % IV BOLUS
1000.0000 mL | Freq: Once | INTRAVENOUS | Status: AC
  Administered 2020-08-05: 1000 mL via INTRAVENOUS

## 2020-08-05 MED ORDER — LEVETIRACETAM IN NACL 500 MG/100ML IV SOLN
500.0000 mg | Freq: Two times a day (BID) | INTRAVENOUS | Status: DC
  Administered 2020-08-05: 500 mg via INTRAVENOUS
  Filled 2020-08-05 (×2): qty 100

## 2020-08-05 NOTE — Telephone Encounter (Signed)
[name] (dad) states that patient just had a third seizure and they are calling EMS to take him to the hospital. Call back number is [phone]

## 2020-08-05 NOTE — ED Notes (Signed)
Pt to CT via stretcher

## 2020-08-05 NOTE — ED Provider Notes (Signed)
[facility] EMERGENCY DEPARTMENT Provider Note   CSN: [id number] Arrival date & time: 08/05/20  1708     History Chief Complaint  Patient presents with  . Seizures    Franklin [name] is a 24 y.o. male.  HPI  Patient presents with his father provides much of the HPI. Patient has a history of seizures.  Last seizure was sometime ago, but today the patient had 3 seizures.  These were witnessed by family members.  There was reportedly a fall during the second or third 1, during which the patient struck his head. Currently the patient notes some minor discomfort in his head, some change in gait, denies focal weakness, denies confusion.  He endorses nausea, had 1 episode of vomiting just prior to ED arrival. Notably, patient ran out of his Keppra 2 days ago.  He has continued to take his Lamictal, however,.   Past Medical History:  Diagnosis Date  . Prematurity   . Retinopathy of prematurity   . Seizures [facility])     Patient Active Problem List   Diagnosis Date Noted  . Generalized anxiety disorder 04/10/2020  . Loss of weight 04/10/2020  . Seizure (HCC) 05/13/2019  . Hypokalemia 05/12/2019  . Hypomagnesemia 05/12/2019  . Hypocalcemia 05/12/2019  . Intellectual delay 03/10/2018  . Personal history of prematurity 03/10/2018  . 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 Long-term use of high-risk medication 06/14/2013    Past Surgical History:  Procedure Laterality Date  . Central line placement     As premature infant  . REFRACTIVE SURGERY     For retinopathy of prematurity       Family History  Problem Relation Age of Onset  . Diabetes type II Father   . Hypercholesterolemia Father   . Hypertension Father   . Stroke Paternal Grandmother   . Autism Brother        Also has cognitive delays  . Stroke Paternal Aunt     Social History   Tobacco Use  . Smoking status: Never Smoker  . Smokeless tobacco: Never Used  Substance Use Topics   . Alcohol use: No  . Drug use: No    Home Medications Prior to Admission medications   Medication Sig Start Date End Date Taking? Authorizing Provider  LAMICTAL XR 100 MG TB24 24 hour tablet Take 1 tablet every morning along with Lamictal XR 250mg  04/04/20  Yes Goodpasture, 04/06/20, NP  LAMICTAL XR 200 MG TB24 24 hour tablet Take 1 tablet at night along with a Lamictal XR 300mg  tablet 04/04/20  Yes Goodpasture, , NP  LAMICTAL XR 250 MG TB24 24 hour tablet Take 1 tablet in the morning along with Lamictal XR 100mg  04/04/20  Yes Goodpasture, Inetta Fermo, NP  LAMICTAL XR 300 MG TB24 24 hour tablet Take 1 tablet at night along with Lamictal XR 200mg  04/04/20  Yes 04/06/20, NP  acetaminophen (TYLENOL) 325 MG tablet Take 2 tablets (650 mg total) by mouth every 4 (four) hours as needed for mild pain (or temp >/= 99.5). Patient not taking: Reported on 06/15/2019 05/14/19   04/06/20, MD  feeding supplement, ENSURE ENLIVE, (ENSURE ENLIVE) LIQD Take 237 mLs by mouth 2 (two) times daily between meals. Patient not taking: Reported on 12/14/2019 05/14/19   07/14/19, MD  ketorolac (ACULAR) 0.5 % ophthalmic solution Place 1 drop into the left eye 4 (four) times daily. Patient not taking: Reported on 08/05/2020 12/29/19 12/28/20  Albertine Grates, MD  levETIRAcetam (KEPPRA) 500  MG tablet 3 tablets in the morning, 3 tablets in the evening 08/05/20   Gerhard Munch, MD  Magnesium Oxide 400 (240 Mg) MG TABS Take 1 tablet (400 mg total) by mouth daily. Patient not taking: Reported on 06/15/2019 05/14/19   Albertine Grates, MD  potassium chloride SA (K-DUR) 20 MEQ tablet Take 2 tablets (40 mEq total) by mouth every Monday, Wednesday, and Friday. Patient not taking: Reported on 06/15/2019 05/15/19   Albertine Grates, MD  prednisoLONE acetate (PRED FORTE) 1 % ophthalmic suspension Place 1 drop into the left eye every hour. Patient not taking: Reported on 04/08/2020 12/29/19   Rennis Chris, MD    Allergies    Concerta [methylphenidate]  Review  of Systems   Review of Systems  Constitutional:       Per HPI, otherwise negative  HENT:       Per HPI, otherwise negative  Respiratory:       Per HPI, otherwise negative  Cardiovascular:       Per HPI, otherwise negative  Gastrointestinal: Negative for vomiting.  Endocrine:       Negative aside from HPI  Genitourinary:       Neg aside from HPI   Musculoskeletal:       Per HPI, otherwise negative  Skin: Negative.   Neurological: Positive for seizures. Negative for syncope.    Physical Exam Updated Vital Signs BP 109/70 (BP Location: Right Arm)   Pulse 92   Temp 98.1 F (36.7 C) (Oral)   Resp 17   Ht 5\' 8"  (1.727 m)   Wt 53.1 kg   SpO2 100%   BMI 17.80 kg/m   Physical Exam Vitals and nursing note reviewed.  Constitutional:      General: He is not in acute distress.    Appearance: He is well-developed.  HENT:     Head: Normocephalic.   Eyes:     Conjunctiva/sclera: Conjunctivae normal.  Cardiovascular:     Rate and Rhythm: Normal rate and regular rhythm.  Pulmonary:     Effort: Pulmonary effort is normal. No respiratory distress.     Breath sounds: No stridor.  Abdominal:     General: There is no distension.  Musculoskeletal:     Cervical back: Full passive range of motion without pain and neck supple. No spinous process tenderness or muscular tenderness.  Skin:    General: Skin is warm and dry.  Neurological:     Mental Status: He is alert and oriented to person, place, and time.     Cranial Nerves: No cranial nerve deficit.     Motor: No weakness, tremor, atrophy or abnormal muscle tone.     ED Results / Procedures / Treatments   Labs (all labs ordered are listed, but only abnormal results are displayed) Labs Reviewed  BASIC METABOLIC PANEL - Abnormal; Notable for the following components:      Result Value   Glucose, Bld 110 (*)    BUN 5 (*)    All other components within normal limits  CBC - Abnormal; Notable for the following components:    Platelets 490 (*)    All other components within normal limits  HEPATIC FUNCTION PANEL - Abnormal; Notable for the following components:   AST 82 (*)    ALT 94 (*)    Alkaline Phosphatase 1,038 (*)    All other components within normal limits  CBG MONITORING, ED - Abnormal; Notable for the following components:   Glucose-Capillary 104 (*)  All other components within normal limits  ETHANOL  LIPASE, BLOOD    EKG None  Radiology CT Head Wo Contrast  Result Date: 08/05/2020 CLINICAL DATA:  Altered mental status with gait difficulty. Several seizures EXAM: CT HEAD WITHOUT CONTRAST TECHNIQUE: Contiguous axial images were obtained from the base of the skull through the vertex without intravenous contrast. COMPARISON:  May 12, 2019 FINDINGS: Brain: Ventricles are normal in size and configuration. There is a degree of left parietal lobe atrophy. There is no intracranial mass, hemorrhage, extra-axial fluid collection, or midline shift. The brain parenchyma appears unremarkable. No evident acute infarct. Vascular: No hyperdense vessel.  No evident vascular calcification. Skull: The bony calvarium appears intact. Sinuses/Orbits: There is a small retention cyst in the medial right maxillary antrum. There is opacification in a posterior left ethmoid air cell. Orbits appear symmetric bilaterally. Other: Mastoid air cells are clear. IMPRESSION: Left parietal lobe atrophy, stable. Ventricles and sulci elsewhere appear unremarkable. Brain parenchyma unremarkable. No acute infarct. No mass or hemorrhage. Foci of paranasal sinus disease noted. Electronically Signed   By: Bretta Bang III M.D.   On: 08/05/2020 20:15    Procedures Procedures (including critical care time)  Medications Ordered in ED Medications  levETIRAcetam (KEPPRA) IVPB 500 mg/100 mL premix (0 mg Intravenous Stopped 08/05/20 1944)  sodium chloride 0.9 % bolus 1,000 mL (0 mLs Intravenous Stopped 08/05/20 1922)  ondansetron  (ZOFRAN) injection 4 mg (4 mg Intravenous Given 08/05/20 1835)  acetaminophen (TYLENOL) tablet 1,000 mg (1,000 mg Oral Given 08/05/20 1921)    ED Course  I have reviewed the triage vital signs and the nursing notes.  Pertinent labs & imaging results that were available during my care of the patient were reviewed by me and considered in my medical decision making (see chart for details).    MDM Rules/Calculators/A&P  On repeat exam patient is awake, alert, has had no additional seizure activity, has received Keppra loading dose, has received Tylenol for headache, Zofran for nausea. Labs reviewed, discussed, no notable electrolyte abnormalities.  CT also reviewed, no notable new findings, atrophy noted.  Now, after several hours of monitoring, no appreciable change, no new seizures, with suspicion for his medication noncompliance contributing to today's episodes, no evidence for post traumatic effects, he is discharged in stable condition. Final Clinical Impression(s) / ED Diagnoses Final diagnoses:  Seizure (HCC)    Rx / DC Orders ED Discharge Orders         Ordered    levETIRAcetam (KEPPRA) 500 MG tablet       Note to Pharmacy: Note change in direction and quantity for next refill   08/05/20 2145           Gerhard Munch, MD 08/05/20 2252

## 2020-08-05 NOTE — Telephone Encounter (Signed)
  Who's calling (name and relationship to patient) : [name] ( dad)  Best contact number:[phone]  Provider they see: Elveria Rising  Reason for call: patient has had 2 seizures one last night and one about 15 min ago. He has missed one dose of his Keppra dad needs to go to pharmacy to pick up the perscription. Dad says he cant find his glasses so he is sitting right up on the TV as well as playing video games. Dad just wanted Inetta Fermo to know what has happened     PRESCRIPTION REFILL ONLY  Name of prescription:  Pharmacy:

## 2020-08-05 NOTE — ED Triage Notes (Signed)
Pt arrive to ED by EMS from home for c/o seizures x 3 today, pt is out of his Sheralyn Boatman since last Saturday, per family pt is getting more frequent seizures episodes during the past few months. Pt is AO x 4 during triage. BP 110/70, HR 100, R 18 SPO2 98% RA CBG 98.

## 2020-08-05 NOTE — Telephone Encounter (Signed)
I called and spoke to Dad. He said that Dannon had 3 seizures today after missing doses of Levetiracetam. He ran out on Saturday so missed Saturday night, both doses on Sunday and this morning's dose. I explained to Dad that is likely why Yoav had the series of seizures. I also talked with Dad about reapplying for Medicaid for [facility] and he agreed to do so. Having Medicaid will give more options for medications and will help him to afford the medications. Dad agreed with this plan. I referred Saad to [facility] to see if they can help with Medicaid application and medications. TG

## 2020-08-05 NOTE — Discharge Instructions (Signed)
As discussed, your evaluation today has been largely reassuring.  But, it is important that you monitor your condition carefully, and do not hesitate to return to the ED if you develop new, or concerning changes in your condition. ? ?Otherwise, please follow-up with your physician for appropriate ongoing care. ? ?

## 2020-09-26 ENCOUNTER — Telehealth (INDEPENDENT_AMBULATORY_CARE_PROVIDER_SITE_OTHER): Payer: Medicaid Other | Admitting: Psychology

## 2020-10-03 DIAGNOSIS — R1110 Vomiting, unspecified: Secondary | ICD-10-CM | POA: Diagnosis present

## 2020-10-03 DIAGNOSIS — U071 COVID-19: Secondary | ICD-10-CM | POA: Diagnosis not present

## 2020-10-03 DIAGNOSIS — R42 Dizziness and giddiness: Secondary | ICD-10-CM | POA: Diagnosis not present

## 2020-10-03 LAB — COMPREHENSIVE METABOLIC PANEL
ALT: 83 U/L — ABNORMAL HIGH (ref 0–44)
AST: 88 U/L — ABNORMAL HIGH (ref 15–41)
Albumin: 4.8 g/dL (ref 3.5–5.0)
Alkaline Phosphatase: 758 U/L — ABNORMAL HIGH (ref 38–126)
Anion gap: 14 (ref 5–15)
BUN: 11 mg/dL (ref 6–20)
CO2: 26 mmol/L (ref 22–32)
Calcium: 9.5 mg/dL (ref 8.9–10.3)
Chloride: 100 mmol/L (ref 98–111)
Creatinine, Ser: 1.11 mg/dL (ref 0.61–1.24)
GFR, Estimated: >60 mL/min (ref >60)
Glucose, Bld: 104 mg/dL — ABNORMAL HIGH (ref 70–99)
Potassium: 3.9 mmol/L (ref 3.5–5.1)
Sodium: 140 mmol/L (ref 135–145)
Total Bilirubin: 0.9 mg/dL (ref 0.3–1.2)
Total Protein: 8.6 g/dL — ABNORMAL HIGH (ref 6.5–8.1)

## 2020-10-03 LAB — CBC
HCT: 52.4 % — ABNORMAL HIGH (ref 39.0–52.0)
Hemoglobin: 17 g/dL (ref 13.0–17.0)
MCH: 29.1 pg (ref 26.0–34.0)
MCHC: 32.4 g/dL (ref 30.0–36.0)
MCV: 89.7 fL (ref 80.0–100.0)
Platelets: 390 10*3/uL (ref 150–400)
RBC: 5.84 MIL/uL — ABNORMAL HIGH (ref 4.22–5.81)
RDW: 14 % (ref 11.5–15.5)
WBC: 8.7 10*3/uL (ref 4.0–10.5)
nRBC: 0 % (ref 0.0–0.2)

## 2020-10-03 LAB — LIPASE, BLOOD: Lipase: 18 U/L (ref 11–51)

## 2020-10-03 NOTE — ED Triage Notes (Signed)
Pt presents via [facility], c/o vomiting x 3 days, denies abd pain, hx seizures and  reports having a seizure yesterday

## 2020-10-04 ENCOUNTER — Emergency Department (HOSPITAL_COMMUNITY): Payer: HRSA Program

## 2020-10-04 ENCOUNTER — Emergency Department (HOSPITAL_COMMUNITY)
Admission: EM | Admit: 2020-10-04 | Discharge: 2020-10-04 | Disposition: A | Discharge status: Home / Self Care | Payer: HRSA Program | Attending: Emergency Medicine | Admitting: Emergency Medicine

## 2020-10-04 DIAGNOSIS — R42 Dizziness and giddiness: Secondary | ICD-10-CM

## 2020-10-04 DIAGNOSIS — R1111 Vomiting without nausea: Secondary | ICD-10-CM

## 2020-10-04 LAB — SARS CORONAVIRUS 2 (TAT 6-24 HRS): SARS Coronavirus 2: POSITIVE — AB

## 2020-10-04 MED ORDER — ONDANSETRON HCL 4 MG/2ML IJ SOLN
4.0000 mg | Freq: Once | INTRAMUSCULAR | Status: AC
  Administered 2020-10-04: 4 mg via INTRAVENOUS
  Filled 2020-10-04: qty 2

## 2020-10-04 MED ORDER — LEVETIRACETAM 500 MG PO TABS
500.0000 mg | ORAL_TABLET | Freq: Once | ORAL | Status: AC
  Administered 2020-10-04: 500 mg via ORAL
  Filled 2020-10-04: qty 1

## 2020-10-04 MED ORDER — LORAZEPAM 2 MG/ML IJ SOLN
1.0000 mg | Freq: Once | INTRAMUSCULAR | Status: AC
  Administered 2020-10-04: 1 mg via INTRAVENOUS
  Filled 2020-10-04: qty 1

## 2020-10-04 MED ORDER — MECLIZINE HCL 25 MG PO TABS
25.0000 mg | ORAL_TABLET | Freq: Once | ORAL | Status: AC
  Administered 2020-10-04: 25 mg via ORAL
  Filled 2020-10-04: qty 1

## 2020-10-04 MED ORDER — MECLIZINE HCL 25 MG PO TABS: 25.0000 mg | ORAL_TABLET | Freq: Three times a day (TID) | ORAL | 0 refills | Status: DC | PRN

## 2020-10-04 MED ORDER — LAMOTRIGINE 100 MG PO TABS
500.0000 mg | ORAL_TABLET | Freq: Once | ORAL | Status: AC
  Administered 2020-10-04: 500 mg via ORAL
  Filled 2020-10-04: qty 5

## 2020-10-04 MED ORDER — LAMOTRIGINE ER 300 MG PO TB24: 300.0000 mg | ORAL_TABLET | Freq: Once | ORAL | Status: DC

## 2020-10-04 MED ORDER — SODIUM CHLORIDE 0.9 % IV BOLUS
1000.0000 mL | Freq: Once | INTRAVENOUS | Status: AC
  Administered 2020-10-04: 1000 mL via INTRAVENOUS

## 2020-10-04 MED ORDER — ONDANSETRON 4 MG PO TBDP: 4.0000 mg | ORAL_TABLET | Freq: Three times a day (TID) | ORAL | 0 refills | Status: DC | PRN

## 2020-10-04 MED ORDER — LAMOTRIGINE ER 200 MG PO TB24: 200.0000 mg | ORAL_TABLET | Freq: Once | ORAL | Status: DC

## 2020-10-04 NOTE — ED Notes (Signed)
Pt able to tolerate PO fluids.  

## 2020-10-04 NOTE — Discharge Instructions (Addendum)
Your dizziness could be from vertigo.  This can cause you to get dizzy and nauseous when you move. Take the meclizine to help with this.    Return if your symptoms worsen.

## 2020-10-04 NOTE — ED Provider Notes (Signed)
[facility]-EMERGENCY DEPT Provider Note   CSN: [id number] Arrival date & time: 10/03/20  1912     History Chief Complaint  Patient presents with  . Emesis  . Seizures    Franklin [name] is a 25 y.o. male.  Patient presents to the emergency department via EMS with chief complaint of vomiting x3 days.  He states that anytime he stands up, he feels like he is going to vomit.  His dizziness symptoms worsen with head movement.  He denies any abdominal pain or chest pain.  He has history of seizures, but denies any seizures today.  States that he may have had one yesterday.  He states that he would like to go home.  He has a legal guardian, who I have tried to contact, but have been unsuccessful.   The history is provided by the patient. No language interpreter was used.       Past Medical History:  Diagnosis Date  . Prematurity   . Retinopathy of prematurity   . Seizures [facility])     Patient Active Problem List   Diagnosis Date Noted  . Generalized anxiety disorder 04/10/2020  . Loss of weight 04/10/2020  . Seizure (HCC) 05/13/2019  . Hypokalemia 05/12/2019  . Hypomagnesemia 05/12/2019  . Hypocalcemia 05/12/2019  . Intellectual delay 03/10/2018  . Personal history of prematurity 03/10/2018  . 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 Long-term use of high-risk medication 06/14/2013    Past Surgical History:  Procedure Laterality Date  . Central line placement     As premature infant  . REFRACTIVE SURGERY     For retinopathy of prematurity       Family History  Problem Relation Age of Onset  . Diabetes type II Father   . Hypercholesterolemia Father   . Hypertension Father   . Stroke Paternal Grandmother   . Autism Brother        Also has cognitive delays  . Stroke Paternal Aunt     Social History   Tobacco Use  . Smoking status: Never Smoker  . Smokeless tobacco: Never Used  Substance Use Topics  . Alcohol use: No  . Drug  use: No    Home Medications Prior to Admission medications   Medication Sig Start Date End Date Taking? Authorizing Provider  acetaminophen (TYLENOL) 325 MG tablet Take 2 tablets (650 mg total) by mouth every 4 (four) hours as needed for mild pain (or temp >/= 99.5). Patient not taking: Reported on 06/15/2019 05/14/19   Albertine Grates, MD  feeding supplement, ENSURE ENLIVE, (ENSURE ENLIVE) LIQD Take 237 mLs by mouth 2 (two) times daily between meals. Patient not taking: Reported on 12/14/2019 05/14/19   Albertine Grates, MD  ketorolac (ACULAR) 0.5 % ophthalmic solution Place 1 drop into the left eye 4 (four) times daily. Patient not taking: Reported on 08/05/2020 12/29/19 12/28/20  Rennis Chris, MD  LAMICTAL XR 100 MG TB24 24 hour tablet Take 1 tablet every morning along with Lamictal XR 250mg  04/04/20   04/06/20, NP  LAMICTAL XR 200 MG TB24 24 hour tablet Take 1 tablet at night along with a Lamictal XR 300mg  tablet 04/04/20   , NP  LAMICTAL XR 250 MG TB24 24 hour tablet Take 1 tablet in the morning along with Lamictal XR 100mg  04/04/20   Elveria Rising, NP  LAMICTAL XR 300 MG TB24 24 hour tablet Take 1 tablet at night along with Lamictal XR 200mg  04/04/20   04/06/20,  NP  levETIRAcetam (KEPPRA) 500 MG tablet 3 tablets in the morning, 3 tablets in the evening 08/05/20   Gerhard Munch, MD  Magnesium Oxide 400 (240 Mg) MG TABS Take 1 tablet (400 mg total) by mouth daily. Patient not taking: Reported on 06/15/2019 05/14/19   Albertine Grates, MD  potassium chloride SA (K-DUR) 20 MEQ tablet Take 2 tablets (40 mEq total) by mouth every Monday, Wednesday, and Friday. Patient not taking: Reported on 06/15/2019 05/15/19   Albertine Grates, MD  prednisoLONE acetate (PRED FORTE) 1 % ophthalmic suspension Place 1 drop into the left eye every hour. Patient not taking: Reported on 04/08/2020 12/29/19   Rennis Chris, MD    Allergies    Concerta [methylphenidate]  Review of Systems   Review of Systems  All  other systems reviewed and are negative.   Physical Exam Updated Vital Signs BP 127/78 (BP Location: Left Arm)   Pulse 86   Temp 97.8 F (36.6 C) (Oral)   Resp 17   Ht 5\' 8"  (1.727 m)   Wt 53.1 kg   SpO2 97%   BMI 17.79 kg/m   Physical Exam Vitals and nursing note reviewed.  Constitutional:      Appearance: He is well-developed and well-nourished.  HENT:     Head: Normocephalic and atraumatic.     Mouth/Throat:     Mouth: Mucous membranes are dry.  Eyes:     Conjunctiva/sclera: Conjunctivae normal.     Comments: Horizontal nystagmus  Cardiovascular:     Rate and Rhythm: Normal rate and regular rhythm.     Heart sounds: No murmur heard.   Pulmonary:     Effort: Pulmonary effort is normal. No respiratory distress.     Breath sounds: Normal breath sounds.  Abdominal:     Palpations: Abdomen is soft.     Tenderness: There is no abdominal tenderness.  Musculoskeletal:        General: No edema. Normal range of motion.     Cervical back: Neck supple.  Skin:    General: Skin is warm and dry.  Neurological:     Mental Status: He is alert and oriented to person, place, and time.  Psychiatric:        Mood and Affect: Mood and affect and mood normal.        Behavior: Behavior normal.     ED Results / Procedures / Treatments   Labs (all labs ordered are listed, but only abnormal results are displayed) Labs Reviewed  COMPREHENSIVE METABOLIC PANEL - Abnormal; Notable for the following components:      Result Value   Glucose, Bld 104 (*)    Total Protein 8.6 (*)    AST 88 (*)    ALT 83 (*)    Alkaline Phosphatase 758 (*)    All other components within normal limits  CBC - Abnormal; Notable for the following components:   RBC 5.84 (*)    HCT 52.4 (*)    All other components within normal limits  SARS CORONAVIRUS 2 (TAT 6-24 HRS)  LIPASE, BLOOD  URINALYSIS, ROUTINE W REFLEX MICROSCOPIC    EKG None  Radiology No results found.  Procedures Procedures    Medications Ordered in ED Medications  sodium chloride 0.9 % bolus 1,000 mL (has no administration in time range)    ED Course  I have reviewed the triage vital signs and the nursing notes.  Pertinent labs & imaging results that were available during my care of the patient were  reviewed by me and considered in my medical decision making (see chart for details).    MDM Rules/Calculators/A&P                          This patient complains of vomiting, this involves an extensive number of treatment options, and is a complaint that carries with it a high risk of complications and morbidity.    Differential Dx COVID, gastro, seizure  Pertinent Labs I ordered, reviewed, and interpreted labs, which included CBC, CMP, Covid.  Chronic LFT elevation, better than in the past.  Imaging Interpretation I ordered imaging studies which included CXR.  I independently visualized and interpreted the CXR, which showed no obvious abnormality.   Medications I ordered medication ns for dehydration.  Reassessments After the interventions stated above, I reevaluated the patient and found feeling improved.  He is stating that his dizziness is improved.  He is no longer vomiting.  He is tolerating oral intake.  RN spoke with family member while I was in the room and relayed the plan for discharge.  All parties were in agreement.  Consultants None  Plan Discharge  We will send prescription for Zofran and for meclizine.  I suspect that the patient's symptoms are combination between dehydration and vertigo.  His dizziness symptoms were worsened with head movements.  He had horizontal nystagmus.  Symptoms improved with meclizine.  Return precautions discussed.    Final Clinical Impression(s) / ED Diagnoses Final diagnoses:  Dizziness  Vomiting without nausea, intractability of vomiting not specified, unspecified vomiting type    Rx / DC Orders ED Discharge Orders         Ordered     ondansetron (ZOFRAN ODT) 4 MG disintegrating tablet  Every 8 hours PRN        10/04/20 0444    meclizine (ANTIVERT) 25 MG tablet  3 times daily PRN        10/04/20 0444           Roxy Horseman, PA-C 10/04/20 0455    Palumbo, April, MD 10/04/20 0109

## 2020-10-04 NOTE — ED Notes (Signed)
Pt states he thinks he had a seizure. This RN did not witness seizure activity. Pt currently A&O x 4. Pt presenting post-ictal with slurred speech. Speech is progressively getting better as the patient continued to talk. PA Rob made aware.

## 2020-10-04 NOTE — ED Notes (Signed)
Called pt's father for pickup

## 2020-10-07 LAB — LAMOTRIGINE LEVEL: Lamotrigine Lvl: 30.1 ug/mL — ABNORMAL HIGH (ref 2.0–20.0)

## 2020-10-09 ENCOUNTER — Ambulatory Visit (INDEPENDENT_AMBULATORY_CARE_PROVIDER_SITE_OTHER): Payer: Medicaid Other | Admitting: Family

## 2020-10-09 ENCOUNTER — Encounter (INDEPENDENT_AMBULATORY_CARE_PROVIDER_SITE_OTHER): Payer: Self-pay

## 2020-10-21 ENCOUNTER — Telehealth (INDEPENDENT_AMBULATORY_CARE_PROVIDER_SITE_OTHER): Payer: Self-pay | Admitting: Family

## 2020-10-21 NOTE — Telephone Encounter (Signed)
I called and spoke to Dad. He said that Franklin [name] was diagnosed with Covid infection on Jan 28th. He has been tired since then but has no other symptoms. He has not missed medication doses. I asked Dad to let me know if Yaphet has any more seizures. I will not make changes in his treatment plan today. Dad agreed with this plan. TG

## 2020-10-21 NOTE — Telephone Encounter (Signed)
Who's calling (name and relationship to patient) : Franklin [name] dad  Best contact number: [phone]  Provider they see: Elveria Rising  Reason for call: Patient had a seizure about an hour ago while sleeping. Please call to discuss.   Call ID:      PRESCRIPTION REFILL ONLY  Name of prescription:  Pharmacy:

## 2020-10-21 NOTE — Telephone Encounter (Signed)
Dad states that it lasted about a minute dad said that he is currently sleeping. I let him know that I will send this to Inetta Fermo and give her the update

## 2020-11-14 ENCOUNTER — Telehealth (INDEPENDENT_AMBULATORY_CARE_PROVIDER_SITE_OTHER): Payer: Self-pay | Admitting: Family

## 2020-11-14 NOTE — Telephone Encounter (Signed)
I called and spoke to Franklin [name]. He said that Franklin [name] was watching TV last night and went to sleep. He had a seizure during sleep that lasted about 45 seconds. He was not injured during the seizure. Franklin [name] says that he has been compliant with medication. He said that Franklin [name] has been going to sleep later than usual tonight. I talked with Franklin [name] and instructed him to continue the seizure medications as prescribed. Franklin [name] has an appointment next week with Dr Huntley Dec and I will add myself to that visit to review seizures with [facility]. Franklin [name] agreed with the plans made today. TG

## 2020-11-14 NOTE — Telephone Encounter (Signed)
FYI

## 2020-11-14 NOTE — Telephone Encounter (Signed)
Who's calling (name and relationship to patient) : [name] Arens dad  Best contact number: [phone]  Provider they see: Elveria Rising   Reason for call: Patient had a 45 second seizure last night while watching tv Dad wanted Inetta Fermo to know.   Call ID:      PRESCRIPTION REFILL ONLY  Name of prescription:  Pharmacy:

## 2020-11-21 ENCOUNTER — Telehealth (INDEPENDENT_AMBULATORY_CARE_PROVIDER_SITE_OTHER): Payer: Medicaid Other | Admitting: Family

## 2020-11-21 ENCOUNTER — Encounter (INDEPENDENT_AMBULATORY_CARE_PROVIDER_SITE_OTHER): Payer: Self-pay

## 2020-11-21 ENCOUNTER — Ambulatory Visit (INDEPENDENT_AMBULATORY_CARE_PROVIDER_SITE_OTHER): Payer: Self-pay | Admitting: Psychology

## 2020-11-25 ENCOUNTER — Telehealth (INDEPENDENT_AMBULATORY_CARE_PROVIDER_SITE_OTHER): Payer: Self-pay | Admitting: Family

## 2020-11-25 NOTE — Telephone Encounter (Signed)
I attempted to call Dad about the seizure. There was no answer at his phone and no option for voicemail. I will call again tomorrow. TG

## 2020-11-25 NOTE — Telephone Encounter (Signed)
Who's calling (name and relationship to patient) : Piers Baade dad  Best contact number: [phone]  Provider they see: Elveria Rising   Reason for call: Dad left message stating that patient had another seizure. He stated it didn't last very long but wanted to keep Elveria Rising in the loop.   Call ID:      PRESCRIPTION REFILL ONLY  Name of prescription:  Pharmacy:

## 2020-11-26 NOTE — Telephone Encounter (Signed)
I called and spoke with Dad. He said that Franklin [name] had been up really late, then had a seizure in sleep. He has not missed medication doses. I talked with Dad about the need for Arinze to get more sleep. Daran has an appointment with me on April 21st. I will talk with him about this at that time. TG

## 2020-12-10 ENCOUNTER — Telehealth (INDEPENDENT_AMBULATORY_CARE_PROVIDER_SITE_OTHER): Payer: Self-pay | Admitting: Family

## 2020-12-10 NOTE — Telephone Encounter (Signed)
  Who's calling (name and relationship to patient) : [name] (dad)  Best contact number: [phone]  Provider they see: Elveria Rising  Reason for call: Dad reports that patient had a seizure while sleeping on Sunday that lasted approximately 30-45 seconds.    PRESCRIPTION REFILL ONLY  Name of prescription:  Pharmacy:

## 2020-12-10 NOTE — Telephone Encounter (Signed)
I attempted to call Dad but received a message saying the mailbox was full and unable to receive messages. I will try again later. TG

## 2020-12-13 ENCOUNTER — Telehealth (INDEPENDENT_AMBULATORY_CARE_PROVIDER_SITE_OTHER): Payer: Self-pay | Admitting: Family

## 2020-12-13 NOTE — Telephone Encounter (Signed)
Dad came in today for Mumin to sign a DPR form. I talked with Dad about adding Fycompa to his regimen and gave him a patient assistance application for this medication. I asked Dad to complete it and bring it back to the scheduled appointment next week. TG

## 2020-12-13 NOTE — Telephone Encounter (Signed)
DPR was signed. I called and left a message for Mom. TG

## 2020-12-13 NOTE — Telephone Encounter (Signed)
Who's calling (name and relationship to patient) : Juriel Cid (Mom)  Best contact number: [phone]  Provider they see: Elveria Rising  Reason for call:  Mom called in stating that she was needing to speak with [facility] AND Dr. Sharene Skeans at the same time regarding Franklin [name]'s both mental and physical health. States she feels like "this could be a life or death issues if it continues as it is", states that Chay is not currently suicidal but has had suicidal ideation the past, has become very defiant and reckless. Does not feel as he is being completely truthful with Inetta Fermo during his visits and mom is concerned about this. Mom did state this is something that is okay to wait and  speak with both providers the first of the week if that is the earliest that they can do so. Eban is not going to sleep until 2-3am and waking up around 9am, walking 45 mins each way to [employer] and will just stay there until walking back that night. She is concerned that he is not eating right, not sleeping, sneaking off, not letting them know his whereabouts and is walking long periods of time, spending 8-10 in front of a computer screen or tv screen. She is concerned this is contributing to his seizures.   Writer did let mom know that since Codey is over the age of 70 we are required to have a DPR filled out to speak and schedule with mom or dad. She expressed understanding and called dad to have him bring Thurmond in to fill out the [facility].   Call ID:      PRESCRIPTION REFILL ONLY  Name of prescription:  Pharmacy:

## 2020-12-18 ENCOUNTER — Telehealth (INDEPENDENT_AMBULATORY_CARE_PROVIDER_SITE_OTHER): Payer: Self-pay

## 2020-12-18 NOTE — Telephone Encounter (Signed)
I called and spoke with Franklin [name]. He said that Franklin [name] got more sleep than usual last night and had a brief seizure this morning after he had awakened for the day. I talked with Franklin [name] about the Patient Assistance form for Fycompa and encouraged him to bring that to the office so that I can start the process for it. We are limited at this point with treatment options as Franklin [name] has no insurance. Dad agreed with this plan. TG

## 2020-12-18 NOTE — Telephone Encounter (Signed)
  Who's calling (name and relationship to patient) : Father  Best contact number: [phone]  Provider they see: Inetta Fermo  Reason for call: Father called to let tina know patient had a seizure that last 30 seconds. Patient is now sleeping. He stated that Inetta Fermo has mentioned before potentially make medication adjustments.     PRESCRIPTION REFILL ONLY  Name of prescription:  Pharmacy:

## 2020-12-24 ENCOUNTER — Telehealth (INDEPENDENT_AMBULATORY_CARE_PROVIDER_SITE_OTHER): Payer: Self-pay | Admitting: Family

## 2020-12-24 NOTE — Telephone Encounter (Signed)
Who's calling (name and relationship to patient) : Dwaine Pringle  Best contact number: [phone]  Provider they see: Elveria Rising  Reason for call: Mom would like a time when she can speak with Elveria Rising and Dr. Sharene Skeans at the same time.   Call ID:      PRESCRIPTION REFILL ONLY  Name of prescription:  Pharmacy:

## 2020-12-24 NOTE — Telephone Encounter (Signed)
I called and spoke with Mom. She is very concerned about Franklin [name]'s behavior and wants to talk to me and Franklin [name] in a joint phone call to determine a plan of action. Mom is interested in "having him placed in a facility" because Franklin [name] leaves his home during the day and walks to places to get free wifi, sometimes arriving home after dark, typically staying up until 2-3AM watching TV, forgets to tell his Dad when he needs refills on medications until the last minute, and doesn't eat regular meals. Mom says that Franklin [name] has seizures because of these behaviors and that he needs to be "made" to understand the consequences of his behavior. I explained to Mom that Franklin [name] has been told that he needs to get more sleep at night and to sleep in a regular pattern, that he needs to take his medication on schedule (which includes ordering refills in a timely manner), needs to not be walking alone at night when seizures may occur, and that he should eat regular meals. I explained that Franklin [name] is a young man and chooses what he does, and that there is not a medication or treatment for this. Mom is very frustrated and says that she has dealt with Franklin [name]'s behavior since he was young and that she wants him to be placed somewhere to learn how to do what he should each day. I explained that Franklin [name] has not been declared incompetent and that forcibly placing him in a facility is not likely to occur. Mom persisted in asking for this to occur and for Franklin [name] to be "made" to comply. Mom asked to speak with Franklin [name] as well as me, and I explained that he is out of the office this week. I told Mom that I will relay her concerns to him when he returns. Mom had no further questions. TG

## 2020-12-26 ENCOUNTER — Encounter (INDEPENDENT_AMBULATORY_CARE_PROVIDER_SITE_OTHER): Payer: Self-pay

## 2020-12-26 ENCOUNTER — Ambulatory Visit (INDEPENDENT_AMBULATORY_CARE_PROVIDER_SITE_OTHER): Payer: Medicaid Other | Admitting: Family

## 2020-12-26 ENCOUNTER — Ambulatory Visit (INDEPENDENT_AMBULATORY_CARE_PROVIDER_SITE_OTHER): Payer: Self-pay | Admitting: Psychology

## 2020-12-27 ENCOUNTER — Telehealth (INDEPENDENT_AMBULATORY_CARE_PROVIDER_SITE_OTHER): Payer: Self-pay | Admitting: Family

## 2020-12-27 NOTE — Telephone Encounter (Signed)
Please advise, was supposed to come yesterday but no showed

## 2020-12-27 NOTE — Telephone Encounter (Signed)
I called and spoke with Dad. He said that Perrin did not stay up as late as he usually does but did play video games for hours last night without stopping. He has not missed medication. Dad said that Dickey had 3 seizures lasting about 1 minute each while he was still asleep this morning. He was not injured with the seizures. I talked with Dad and told him that we are limited with treatment options because [place]es doesn't have insurance or way to pay for medication. I gave him a Fycompa patient assistance form recently but he has not returned it to me to process. I asked Dad to bring the form to the office so that I can work on getting the patient assistance program approved. Rushawn had an appointment with me yesterday but did not show up. He has been rescheduled to next week. I asked Dad to be sure to keep that appointment. He agreed with these plans. TG

## 2020-12-27 NOTE — Telephone Encounter (Signed)
  Who's calling (name and relationship to patient) :Dad/ Milly Jakob contact [phone]  Provider they [facility]   Reason for call:Dad called stating that Jowel has had 3 seizures today back to back lasting around a minute each time. Dad requested a call back as soon as possible.      PRESCRIPTION REFILL ONLY  Name of prescription:  Pharmacy:

## 2020-12-31 ENCOUNTER — Other Ambulatory Visit: Payer: Self-pay

## 2020-12-31 ENCOUNTER — Encounter (INDEPENDENT_AMBULATORY_CARE_PROVIDER_SITE_OTHER): Payer: Self-pay | Admitting: Family

## 2020-12-31 ENCOUNTER — Ambulatory Visit (INDEPENDENT_AMBULATORY_CARE_PROVIDER_SITE_OTHER): Payer: Self-pay | Admitting: Family

## 2020-12-31 VITALS — BP 118/78 | HR 96 | Ht 69.0 in | Wt 130.8 lb

## 2020-12-31 DIAGNOSIS — R634 Abnormal weight loss: Secondary | ICD-10-CM

## 2020-12-31 DIAGNOSIS — F411 Generalized anxiety disorder: Secondary | ICD-10-CM

## 2020-12-31 DIAGNOSIS — F819 Developmental disorder of scholastic skills, unspecified: Secondary | ICD-10-CM

## 2020-12-31 DIAGNOSIS — G40309 Generalized idiopathic epilepsy and epileptic syndromes, not intractable, without status epilepticus: Secondary | ICD-10-CM

## 2020-12-31 MED ORDER — FYCOMPA 2 MG PO TABS: ORAL_TABLET | ORAL | 5 refills | Status: DC

## 2020-12-31 MED ORDER — FYCOMPA 2 MG PO TABS: 2.0000 mg | ORAL_TABLET | Freq: Every day | ORAL | 0 refills | Status: DC

## 2020-12-31 NOTE — Patient Instructions (Signed)
Thank you for coming in today.   Instructions for you until your next appointment are as follows: 1. Continue taking the Levetiracetam and Lamictal XR as ordered. Try not to miss any doses.  2. I gave you samples today of Fycompa 2mg . Take 1 tablet at bedtime. We also completed the patient assistance program application for you to get the medication at no cost from the company that makes it. I will let you know when I hear back from that.  3. Work on going to sleep at the same time each night and getting at least 8 hours of sleep each night. This is important as not getting enough sleep can trigger seizures.  4. I ordered the refill for the Lamictal from that patient assistance program. You can reapply for it as of February 08, 2021. We need to do this as soon as possible in June so that there will be no interruption in getting your medication.  5. Please sign up for MyChart if you have not done so. 6. Please plan to return for follow up in June when you come in to see Dr July, or sooner if needed.   [facility] we are committed to providing exceptional care. You will receive a patient satisfaction survey through text or email regarding your visit today. Your opinion is important to me. Comments are appreciated.

## 2020-12-31 NOTE — Progress Notes (Signed)
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 He is taking and tolerating Lamictal XR that he receives from [employer] Patient Assistance Program and Levetiracetam that he purchases from the pharmacy. He has no insurance coverage.  Today's concerns: Franklin [name] is seen today in follow up for history of generalized convulsive epilepsy. His father calls me when seizures occur and Dad has reported 9 seizures since his last visit. On most occasions, Franklin [name] has stayed up very late and had seizures in the setting of sleep deprivation. There was one occasion in which he had missed a medication dose. Dad notes that there were a few seizures in which he had not missed medication or sleep. Most of the recent seizures have occurred during sleep. He has not been injured during any of the seizures.   Franklin [name] tells me today that he spends his days by [place] [employer] to use the computers and talk with people there. He spends his evenings with his father, aunt and a cousin. Franklin [name] and his cousin typically play video games late into the night.   When he was last seen, Franklin [name] had lost considerable weight related to anxiety. His anxiety has improved as has his weight.  Franklin [name] has been otherwise generally healthy since he was last seen. Neither he nor Dad have other health concerns for him today other than previously mentioned.  Review of systems: Please see HPI for neurologic and other pertinent review of systems. Otherwise all other systems were reviewed and were negative.  Problem List: Patient Active Problem List   Diagnosis Date  Noted  . Generalized anxiety disorder 04/10/2020  . Loss of weight 04/10/2020  . Seizure (HCC) 05/13/2019  . Hypokalemia 05/12/2019  . Hypomagnesemia 05/12/2019  . Hypocalcemia 05/12/2019  . Intellectual delay 03/10/2018  . Personal history of prematurity 03/10/2018  . 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 Long-term use of high-risk medication 06/14/2013     Past Medical History:  Diagnosis Date  . Prematurity   . Retinopathy of prematurity   . 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 He was tried on Concerta which caused significant emotional disturbance, then on Ritalin, then Adderall which worked well. He has mild hearing loss.  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 He required ventilator support for sometime. He reportedly had no CNS hemorrhage. Growth and development was delayed.  Surgical history: Past Surgical History:  Procedure Laterality Date  . Central line placement     As premature infant  . REFRACTIVE SURGERY     For retinopathy of prematurity     Family history: family history includes Autism in his brother; Diabetes type II in his father; Hypercholesterolemia in his  father; Hypertension in his father; Stroke in his paternal aunt and paternal grandmother.   Social history: Social History   Socioeconomic History  . Marital status: Single    Spouse name: Not on file  . Number of children: Not on file  . Years of education: Not on file  . Highest education level: Not on file   Occupational History  . Not on file  Tobacco Use  . Smoking status: Never Smoker  . Smokeless tobacco: Never Used  Substance and Sexual Activity  . Alcohol use: No  . Drug use: No  . Sexual activity: Not Currently  Other Topics Concern  . Not on file  Social History Narrative   Parents are divorced. Raesean lives with his father.   Social Determinants of Health   Financial Resource Strain: Not on file  Food Insecurity: Not on file  Transportation Needs: Not on file  Physical Activity: Not on file  Stress: Not on file  Social Connections: Not on file  Intimate Partner Violence: Not on file    Past/failed meds:   Allergies: Allergies  Allergen Reactions  . Concerta [Methylphenidate] Other (See Comments)    Would start crying for little things like when someone coughed or said a specific word for example.     Immunizations: Immunization History  Administered Date(s) Administered  . PFIZER(Purple Top)SARS-COV-2 Vaccination 12/08/2019, 01/01/2020  . Tdap 06/30/2015, 09/14/2018    Diagnostics/Screenings: Copied from previous record: EEG in 2006 - generalized convulsive epilepsy  Physical Exam: BP 118/78   Pulse 96   Ht 5\' 9"  (1.753 m)   Wt 130 lb 12.8 oz (59.3 kg)   BMI 19.32 kg/m   Wt Readings from Last 3 Encounters:  12/31/20 130 lb 12.8 oz (59.3 kg)  10/04/20 117 lb (53.1 kg)  08/05/20 117 lb 1 oz (53.1 kg)   General: thin but otherwise well developed, well nourished young man, seated on exam table, in no evident distress; dyed purple hair, brown eyes, right handed Head: normocephalic and atraumatic. Oropharynx benign. No dysmorphic features. Neck: supple Cardiovascular: regular rate and rhythm, no murmurs. Respiratory: clear to auscultation bilaterally Abdomen: bowel sounds present all four quadrants, abdomen soft, non-tender, non-distended. Musculoskeletal: no skeletal deformities or obvious scoliosis. Skin: no rashes or neurocutaneous  lesions  Neurologic Exam Mental Status: awake and fully alert. Attention span, concentration and fund of knowledge appropriate for age. Speech fluent without dysarthria. Able to follow commands and participate in examination. Cranial Nerves: fundoscopic exam - red reflex present.  Unable to fully visualize fundus.  Pupils equal briskly reactive to light. Extraocular movements full without nystagmus. Hearing intact and symmetric to whisper. Facial movements are symmetric. Motor: normal bulk, tone and strength in all tested extremity muscles Sensory: withdrawal x 4 Coordination: rapid alternating movements symmetric bilaterally, finger to nose and heel to shin intact and symmetric.  Gait and Station: normal stance and gait. Able to heel, toe and tandem walk without difficulty Reflexes: diminished and symmetric. Toes neutral. No clonus  Impression: Generalized convulsive epilepsy (HCC) - Plan: FYCOMPA 2 MG tablet  Intellectual delay  Generalized anxiety disorder  Loss of weight   Recommendations for plan of care: The patient's previous [facility] records were reviewed. Darcy has neither had nor required imaging or lab studies since the last visit. He is a 25 year old young man with history of generalized convulsive epilepsy, intellectual delay, anxiety and weight loss. He is taking and tolerating Levetiracetam and Lamictal XR but continues to experience breakthrough seizures. I  talked with Franklin Hailey about the need for him to get at last 8 hours of sleep at night, and to stay on a regular sleep schedule. I recommended a trial of Fycompa and helped him to complete a Patient Assistance Application for that medication. I gave him samples to last for 2 weeks and told him that I will let him know when I hear back about the application. Dad asked me to call for Lamictal XR refills from the [employer] Patient Assistance Program as he is out of phone minutes. I called the [employer] program and requested refills for him.    Roni has gained weight since his last visit and I encouraged him to continue to maintain that weight.   I will see Cord back in follow up in June in joint visit with Dr Huntley Dec or sooner if needed, He and his father agreed with the plans made today.   The medication list was reviewed and reconciled. I reviewed changes that were made in the prescribed medications today. A complete medication list was provided to the patient.  Return in about 7 weeks (around 02/18/2021).   Allergies as of 12/31/2020      Reactions   Concerta [methylphenidate] Other (See Comments)   Would start crying for little things like when someone coughed or said a specific word for example.       Medication List       Accurate as of December 31, 2020  5:05 PM. If you have any questions, ask your nurse or doctor.        acetaminophen 325 MG tablet Commonly known as: TYLENOL Take 2 tablets (650 mg total) by mouth every 4 (four) hours as needed for mild pain (or temp >/= 99.5).   feeding supplement Liqd Take 237 mLs by mouth 2 (two) times daily between meals.   Fycompa 2 MG tablet Generic drug: perampanel Take 1 tablet at bedtime for 2 weeks, then take 2 tablets at bedtime for 2 weeks, then take 3 tablets at bedtime Started by: Franklin Rising, NP   Fycompa 2 MG tablet Generic drug: perampanel Take 1 tablet (2 mg total) by mouth at bedtime. Started by: Franklin Rising, NP   LaMICtal XR 100 MG Tb24 24 hour tablet Generic drug: LamoTRIgine Take 1 tablet every morning along with Lamictal XR 250mg    LaMICtal XR 200 MG Tb24 24 hour tablet Generic drug: LamoTRIgine Take 1 tablet at night along with a Lamictal XR 300mg  tablet   LaMICtal XR 250 MG Tb24 24 hour tablet Generic drug: LamoTRIgine Take 1 tablet in the morning along with Lamictal XR 100mg    LaMICtal XR 300 MG Tb24 24 hour tablet Generic drug: LamoTRIgine Take 1 tablet at night along with Lamictal XR 200mg    levETIRAcetam 500 MG  tablet Commonly known as: KEPPRA 3 tablets in the morning, 3 tablets in the evening   Magnesium Oxide 400 (240 Mg) MG Tabs Take 1 tablet (400 mg total) by mouth daily.   meclizine 25 MG tablet Commonly known as: ANTIVERT Take 1 tablet (25 mg total) by mouth 3 (three) times daily as needed for dizziness.   ondansetron 4 MG disintegrating tablet Commonly known as: Zofran ODT Take 1 tablet (4 mg total) by mouth every 8 (eight) hours as needed for nausea or vomiting.   potassium chloride SA 20 MEQ tablet Commonly known as: KLOR-CON Take 2 tablets (40 mEq total) by mouth every Monday, Wednesday, and Friday.   prednisoLONE acetate 1 % ophthalmic suspension Commonly known  as: PRED FORTE Place 1 drop into the left eye every hour.       Total time spent with the patient was 20 minutes, of which 50% or more was spent in counseling and coordination of care.  Franklin Rising NP-C [facility] Ph. [phone] Fax [phone]

## 2020-12-31 NOTE — Telephone Encounter (Signed)
I agree that there is no treatment for facility that we can offer a young man who is not mentally incompetent.

## 2021-01-01 ENCOUNTER — Telehealth (INDEPENDENT_AMBULATORY_CARE_PROVIDER_SITE_OTHER): Payer: Self-pay | Admitting: Family

## 2021-01-01 NOTE — Telephone Encounter (Signed)
I wrote a letter and will send to [facility] Patient Assistance as requested. TG

## 2021-01-01 NOTE — Telephone Encounter (Signed)
  Who's calling (name and relationship to patient) : Maisie Fus from [facility]  Best contact number: [phone]; option 1, then option 1, then option 1 again.  Provider they see: Elveria Rising  Reason for call: Maisie Fus states that they need a letter on practice letterhead stating that patient has no income and no insurance and needs financial assistance to receive   FYCOMPA 2 MG tablet    PRESCRIPTION REFILL ONLY  Name of prescription:  Pharmacy:

## 2021-01-02 NOTE — Telephone Encounter (Signed)
Maisie Fus from [place] called to advise that patient was approved for one year 01/01/21-01/01/22. If he has not heard anything to them regarding delivery in the next 24-48 hours he can call [phone].

## 2021-01-02 NOTE — Telephone Encounter (Signed)
Noted. TG 

## 2021-01-12 ENCOUNTER — Encounter (INDEPENDENT_AMBULATORY_CARE_PROVIDER_SITE_OTHER): Payer: Self-pay

## 2021-02-19 ENCOUNTER — Encounter (INDEPENDENT_AMBULATORY_CARE_PROVIDER_SITE_OTHER): Payer: Self-pay | Admitting: Psychology

## 2021-02-20 ENCOUNTER — Ambulatory Visit (INDEPENDENT_AMBULATORY_CARE_PROVIDER_SITE_OTHER): Payer: Medicaid Other | Admitting: Family

## 2021-02-20 ENCOUNTER — Encounter (INDEPENDENT_AMBULATORY_CARE_PROVIDER_SITE_OTHER): Payer: Self-pay | Admitting: Family

## 2021-02-20 ENCOUNTER — Ambulatory Visit (INDEPENDENT_AMBULATORY_CARE_PROVIDER_SITE_OTHER): Payer: Self-pay | Admitting: Psychology

## 2021-02-20 ENCOUNTER — Other Ambulatory Visit: Payer: Self-pay

## 2021-02-20 VITALS — BP 128/76 | HR 80 | Wt 166.7 lb

## 2021-02-20 DIAGNOSIS — F411 Generalized anxiety disorder: Secondary | ICD-10-CM

## 2021-02-20 DIAGNOSIS — F79 Unspecified intellectual disabilities: Secondary | ICD-10-CM

## 2021-02-20 DIAGNOSIS — F819 Developmental disorder of scholastic skills, unspecified: Secondary | ICD-10-CM

## 2021-02-20 DIAGNOSIS — Z87898 Personal history of other specified conditions: Secondary | ICD-10-CM

## 2021-02-20 DIAGNOSIS — G40309 Generalized idiopathic epilepsy and epileptic syndromes, not intractable, without status epilepticus: Secondary | ICD-10-CM

## 2021-02-20 DIAGNOSIS — Z72820 Sleep deprivation: Secondary | ICD-10-CM

## 2021-02-20 NOTE — Progress Notes (Signed)
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 He is taking and tolerating Lamictal XR that he receives from [employer] Patient Assistance Program and Levetiracetam that he purchases from the pharmacy. He has no insurance coverage. He was approved for [employer] Patient Assistance Program for [place].   Today's concerns: Franklin [name] and his father tell me today that he has not received Fycompa from the Patient Assistance Program. Franklin [name] had a seizure a few days ago despite compliance with medication. He has problems with sleep and some seizures are triggered by inadequate sleep. Franklin [name] and his father share a small home with other relatives and he and his father sleep together in a bed. Franklin [name] says that he has trouble sleeping in the same bed and sometimes gets up during the night.   Franklin [name] has been otherwise generally healthy since he was last seen. Neither he nor his father have other health concerns for him today other than previously mentioned.  Review of systems: Please see HPI for neurologic and other pertinent review of systems. Otherwise all other systems were reviewed and were negative.  Problem List: Patient Active Problem List   Diagnosis Date Noted   Generalized anxiety disorder 04/10/2020   Loss of weight 04/10/2020   Seizure (HCC) 05/13/2019   Hypokalemia 05/12/2019   Hypomagnesemia 05/12/2019   Hypocalcemia 05/12/2019   Intellectual delay 03/10/2018   Personal history of prematurity 03/10/2018   Generalized convulsive epilepsy (HCC) 06/14/2013   Long-term use of high-risk medication 06/14/2013     Past Medical History:  Diagnosis Date    Prematurity    Retinopathy of prematurity    Seizures (HCC)     Past medical history comments: See HPI Copied from previous record: When he was in school he was diagnosed 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 He was tried on Concerta which caused significant emotional disturbance, then on Ritalin, then Adderall which worked well. 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   Birth History: He was born at [redacted] weeks gestation, weighing 1 lb 13 1/2 oz. He required ventilator support for sometime. He reportedly had no CNS hemorrhage. Growth and development was delayed.   Surgical history: Past Surgical History:  Procedure Laterality Date   Central line placement     As premature infant   REFRACTIVE SURGERY     For retinopathy of prematurity     Family history: family history includes Autism in his brother; Diabetes type II in his father; Hypercholesterolemia in his father; Hypertension in his father; Stroke in his paternal aunt and paternal grandmother.   Social history: Social History   Socioeconomic History   Marital status: Single    Spouse name: Not on file   Number of children: Not on file   Years of education: Not on file   Highest education level: Not on file  Occupational History   Not on file  Tobacco Use  Smoking status: Never   Smokeless tobacco: Never  Substance and Sexual Activity   Alcohol use: No   Drug use: No   Sexual activity: Not Currently  Other Topics Concern   Not on file  Social History Narrative   Parents are divorced. Franklin [name] lives with his father.   Social Determinants of Health   Financial Resource Strain: Not on file  Food Insecurity: Not on file   Transportation Needs: Not on file  Physical Activity: Not on file  Stress: Not on file  Social Connections: Not on file  Intimate Partner Violence: Not on file    Past/failed meds:  Allergies: Allergies  Allergen Reactions   Concerta [Methylphenidate] Other (See Comments)    Would start crying for little things like when someone coughed or said a specific word for example.      Immunizations: Immunization History  Administered Date(s) Administered   PFIZER(Purple Top)SARS-COV-2 Vaccination 12/08/2019, 01/01/2020   Tdap 06/30/2015, 09/14/2018    Diagnostics/Screenings: Copied from previous record: EEG in 2006 - generalized convulsive epilepsy   Physical Exam: BP 128/76   Pulse 80   Wt 166 lb 10.7 oz (75.6 kg)   BMI 24.61 kg/m   General: Well developed, well nourished young man, seated on exam table in no evident distress,  dyed red-purple hair, brown eyes, right handed Head: Head normocephalic and atraumatic.  Oropharynx benign. Neck: Supple Cardiovascular: Regular rate and rhythm, no murmurs Respiratory: Breath sounds clear to auscultation Musculoskeletal: No obvious deformities or scoliosis Skin: No rashes or neurocutaneous lesions  Neurologic Exam Mental Status: Awake and fully alert.  Oriented to place and time.  Recent and remote memory intact.  Attention span, concentration, and fund of knowledge subnormal for age.  Mood and affect appropriate. Cranial Nerves: Fundoscopic exam reveals sharp disc margins.  Pupils equal, briskly reactive to light.  Extraocular movements full without nystagmus.  Visual fields full to confrontation.  Hearing intact and symmetric to finger rub.  Facial sensation intact.  Face tongue, palate move normally and symmetrically.  Neck flexion and extension normal. Motor: Normal bulk and tone. Normal strength in all tested extremity muscles. Sensory: Intact to touch and temperature in all extremities.  Coordination: Rapid alternating movements  normal in all extremities.  Finger-to-nose and heel-to shin performed accurately bilaterally.  Romberg negative. Gait and Station: Arises from chair without difficulty.  Stance is normal. Gait demonstrates normal stride length and balance.   Able to heel, toe and tandem walk without difficulty. Reflexes: 1+ and symmetric. Toes downgoing.   Impression: Generalized convulsive epilepsy (HCC)  Intellectual delay  Personal history of prematurity  Generalized anxiety disorder  Poor sleep   Recommendations for plan of care: The patient's previous [facility] records were reviewed. Franklin [name] has neither had nor required imaging or lab studies since the last visit. He is a 25 year old young man with history of prematurity, intellectual disability, epilepsy, anxiety and poor sleep. Care has been limited by lack of medical insurance. Franklin [name] is taking and tolerating Lamictal XR from [employer] Patient Assistance Program and Levetiracetam that he purchases from a pharmacy. He was approved for [employer] Patient Assistance for [place] but has not received medication yet. I called the company and learned that they need to speak to Franklin [name] or his father to arrange shipping. Franklin [name] spoke with the representative and was told that the medication would arrive later this week. I asked Franklin [name] to let me know that if does not occur. I also asked Franklin [name] to let me know  if Franklin [name] has more seizures or if he has side effects from the [place]. I reminded Quinzell about the need for compliance with medication and about getting at least 8 hours of sleep each night. I will see Franklin [name] back in follow up in September or sooner if needed.   The medication list was reviewed and reconciled. No changes were made in the prescribed medications today. A complete medication list was provided to the patient.  Return in about 3 months (around 05/23/2021).   Allergies as of 02/20/2021       Reactions   Concerta [methylphenidate] Other (See Comments)   Would start crying  for little things like when someone coughed or said a specific word for example.         Medication List        Accurate as of February 20, 2021 11:59 PM. If you have any questions, ask your nurse or doctor.          acetaminophen 325 MG tablet Commonly known as: TYLENOL Take 2 tablets (650 mg total) by mouth every 4 (four) hours as needed for mild pain (or temp >/= 99.5).   aspirin EC 325 MG tablet Take 325 mg by mouth daily.   feeding supplement Liqd Take 237 mLs by mouth 2 (two) times daily between meals.   Fycompa 2 MG tablet Generic drug: perampanel Take 1 tablet at bedtime for 2 weeks, then take 2 tablets at bedtime for 2 weeks, then take 3 tablets at bedtime   Fycompa 2 MG tablet Generic drug: perampanel Take 1 tablet (2 mg total) by mouth at bedtime.   LaMICtal XR 100 MG Tb24 24 hour tablet Generic drug: LamoTRIgine Take 1 tablet every morning along with Lamictal XR 250mg    LaMICtal XR 200 MG Tb24 24 hour tablet Generic drug: LamoTRIgine Take 1 tablet at night along with a Lamictal XR 300mg  tablet   LaMICtal XR 250 MG Tb24 24 hour tablet Generic drug: LamoTRIgine Take 1 tablet in the morning along with Lamictal XR 100mg    LaMICtal XR 300 MG Tb24 24 hour tablet Generic drug: LamoTRIgine Take 1 tablet at night along with Lamictal XR 200mg    levETIRAcetam 500 MG tablet Commonly known as: KEPPRA 3 tablets in the morning, 3 tablets in the evening   magnesium oxide 400 (240 Mg) MG tablet Commonly known as: MAG-OX Take 1 tablet (400 mg total) by mouth daily.   meclizine 25 MG tablet Commonly known as: ANTIVERT Take 1 tablet (25 mg total) by mouth 3 (three) times daily as needed for dizziness.   ondansetron 4 MG disintegrating tablet Commonly known as: Zofran ODT Take 1 tablet (4 mg total) by mouth every 8 (eight) hours as needed for nausea or vomiting.   potassium chloride SA 20 MEQ tablet Commonly known as: KLOR-CON Take 2 tablets (40 mEq total) by  mouth every Monday, Wednesday, and Friday.   prednisoLONE acetate 1 % ophthalmic suspension Commonly known as: PRED FORTE Place 1 drop into the left eye every hour.        Total time spent with the patient was 20 minutes, of which 50% or more was spent in counseling and coordination of care.  NP-C [facility] Ph. [phone] Fax [phone]

## 2021-02-20 NOTE — BH Specialist Note (Signed)
Integrated Behavioral Health Follow Up In-Person Visit  MRN: [id number] Name: Franklin [name]  Number of Integrated Behavioral Health Clinician visits: 3/6 Session Start time: 12:55 PM  Session End time: 1:35 PM Total time: 40 minutes  Types of Service: Individual psychotherapy  Interpretor:No. Interpretor Name and Language: n/a  Subjective: Franklin [name] is a 25 y.o. male with history of epilepsy, prematurity and intellectual disability accompanied by Father Patient was referred by Franklin Rising, NP for anxiety. Patient reports the following symptoms/concerns: anxiety, family stress, difficulty navigating necessary services given intellectual disability Duration of problem: years; Severity of problem: moderate  Franklin [name] likes to walk to [employer] to hang out during the day.  His father doesn't want him doing this because he gets home too late.  His mom has a new house in [place].  She is still trying to get settled in.  They are moving things bought at auctions in the past.    His parents got into a big argument about something and now his mom and dad haven't been talking for a few days.  Franklin [name] continues to feel stressed "every day."  He frequently argues with his mom and other family members that lives in his house.  Objective: Mood: Anxious and Affect: Appropriate Risk of harm to self or others: No plan to harm self or others  Life Context: Family and Social: Lives with dad, aunt, cousin, cousin's friend and dog.  His cousin's friend was using meth, but now he is clean.  Franklin [name] continues to have to sleep with his dad.  His cousin and his friend are loud when they are trying to sleep.  School/Work: not currently working   Patient and/or Family's Strengths/Protective Factors: Parental Resilience  Goals Addressed: Patient will:  Reduce symptoms of: anxiety  Progress towards Goals: Ongoing  Interventions: Interventions utilized:  Supportive Counseling,  Psychoeducation and/or Health Education, and Link to [employer] process emotions related to family stress.  Reviewed coping skills for stress Standardized Assessments completed: Not Needed  Patient and/or Family Response: Listening to music and writing music tends to help him escape.    Assessment: Patient currently experiencing anxiety related to his future, family stress and limited resources.  He would benefit from applying for medicaid given he is currently uninsured. Once he is on medicaid, he would benefit from a case manager to help him determine what services that he qualifies for.  Plan: Encouraged to contact [facility] and DSS about application for medicaid Once he is receiving medicaid, request for a case manager and/or patient navigator to help him apply for diability Follow up with behavioral health clinician on : plan on meeting with new [facility] once they start; family is aware I will be transitioning to another role Behavioral recommendations: continue listening to music to help manage stress  Franklin Callas, PhD

## 2021-02-20 NOTE — Patient Instructions (Signed)
Thank you for coming in today.   Instructions for you until your next appointment are as follows: Continue taking the Lamictal and Levetiracetam as prescribed.  When you get the shipment of Fycompa, take 1 tablet at bedtime for 2 weeks. Then take 2 tablets at bedtime for 2 weeks, then take 3 tablets at bedtime after that.  You may want to take it around suppertime if it makes you feel sleepy in the mornings If you have any side effects with the medication, let me know Let me know if you have any seizures Remember that it is important for you to get at least 8-9 hours of sleep each night Please plan to return for follow up in 3 months or sooner if needed.  [facility], we are committed to providing exceptional care. You will receive a patient satisfaction survey through text or email regarding your visit today. Your opinion is important to me. Comments are appreciated.

## 2021-03-01 ENCOUNTER — Encounter (INDEPENDENT_AMBULATORY_CARE_PROVIDER_SITE_OTHER): Payer: Self-pay | Admitting: Family

## 2021-03-01 DIAGNOSIS — Z72820 Sleep deprivation: Secondary | ICD-10-CM | POA: Insufficient documentation

## 2021-03-11 ENCOUNTER — Encounter (INDEPENDENT_AMBULATORY_CARE_PROVIDER_SITE_OTHER): Payer: Self-pay | Admitting: Psychology

## 2021-03-24 ENCOUNTER — Telehealth (INDEPENDENT_AMBULATORY_CARE_PROVIDER_SITE_OTHER): Payer: Self-pay | Admitting: Family

## 2021-03-24 DIAGNOSIS — G40309 Generalized idiopathic epilepsy and epileptic syndromes, not intractable, without status epilepticus: Secondary | ICD-10-CM

## 2021-03-24 MED ORDER — LAMICTAL XR 300 MG PO TB24: ORAL_TABLET | ORAL | 3 refills | Status: DC

## 2021-03-24 MED ORDER — LAMICTAL XR 200 MG PO TB24: ORAL_TABLET | ORAL | 3 refills | Status: DC

## 2021-03-24 MED ORDER — LAMICTAL XR 250 MG PO TB24: ORAL_TABLET | ORAL | 3 refills | Status: DC

## 2021-03-24 MED ORDER — LEVETIRACETAM 500 MG PO TABS: ORAL_TABLET | ORAL | 5 refills | Status: DC

## 2021-03-24 MED ORDER — LAMICTAL XR 100 MG PO TB24: ORAL_TABLET | ORAL | 3 refills | Status: DC

## 2021-03-24 NOTE — Telephone Encounter (Signed)
I called and spoke with Dad. He said that Shriyan had a short seizure during sleep yesterday. He has been compliant with medication. At this last visit, Fycompa was added and he is scheduled to increase that dose tomorrow.  Abir needs Levetiracetam refill, which I will do.  It is also time to reapply for Lamictal Patient Assistance. I will call [employer] today about that and call Dad after I speak to them. TG

## 2021-03-24 NOTE — Telephone Encounter (Signed)
Who's calling (name and relationship to patient) : Reign Bartnick dad  Best contact number: [phone]  Provider they see: Elveria Rising   Reason for call: Patient had a seizure yesterday.   Dad wants Inetta Fermo to refill lamictal   Call ID:      PRESCRIPTION REFILL ONLY  Name of prescription: Lamictal  Pharmacy:

## 2021-03-24 NOTE — Telephone Encounter (Signed)
I called [employer] Patient Assistance and learned that his current enrollment will end 04/09/21. I called Dad and asked him to bring [employer] by this week to sign a new application. Dad will bring [facility] tomorrow. TG

## 2021-03-31 ENCOUNTER — Telehealth (INDEPENDENT_AMBULATORY_CARE_PROVIDER_SITE_OTHER): Payer: Self-pay | Admitting: Family

## 2021-03-31 NOTE — Telephone Encounter (Signed)
I called father and left a message for him to call back after 115 until 130 and told him if I did not reach him then I would talk to him after I saw afternoon patients.  I informed her that Inetta Fermo was out of the office and I was covering for her.  Issues I need to discuss our the doses of medication, whether Fycompa has actually been started in the circumstances surrounding his seizure.

## 2021-03-31 NOTE — Telephone Encounter (Signed)
Dad returned call and will call back after dr.hickling has completed his patients

## 2021-03-31 NOTE — Telephone Encounter (Signed)
  Who's calling (name and relationship to patient) : [name], father (DPR on file)  Best contact number: [phone]  Provider they see: Elveria Rising  Reason for call: Left a voicemail stating patient had a seizure this morning and wanted to make [facility] aware. I returned his call and had to leave a voicemail letting him know a clinic staff member would be calling them.      PRESCRIPTION REFILL ONLY  Name of prescription:  Pharmacy:

## 2021-04-01 ENCOUNTER — Telehealth (INDEPENDENT_AMBULATORY_CARE_PROVIDER_SITE_OTHER): Payer: Self-pay | Admitting: Pediatrics

## 2021-04-01 NOTE — Telephone Encounter (Signed)
Father seems to be leaving this in Franklin [name]'s hands to manage.  The family did not have money to pay for the prescription until tomorrow.  A breakthrough seizure when he is not able to be compliant with medication is unfortunate but there is no reason to change his treatment.  I appreciate the phone call made by Franklin [name].  I will transfer this also to [place].

## 2021-04-01 NOTE — Telephone Encounter (Signed)
Patient had an early morning generalized convulsive seizure lasting approximately a minute.  His previous seizure was a week before.  He has acquired the Fycompa, the Lamictal extended release 300 mg, and levetiracetam 500 mg.  The latter he has to purchase tomorrow when a cousin has the money to purchase it.  He has not run out of the drugs.  I strongly encouraged him to continue to take the medicines as prescribed and told him I would pass the information on to [place].  I suspect he will go up on Fycompa from 2 mg to 4 mg.  He is tolerating it well.  There was some confusion between [place] and his father about whether he had the medication.    I have the impression that it is totally left up to [facility] to keep track of his medications.  I understand that he is an adult, but his father seem to be totally unaware of the situation vis--vis Fycompa.  I did not make any changes.

## 2021-04-01 NOTE — Telephone Encounter (Signed)
  Who's calling (name and relationship to patient) :Zahid, Carneiro (Father)  Best contact number: [phone] (Home) Provider they see: Deetta Perla, MD Reason for call:  Prentis had another seizure this morning lasting about 10 or 20 seconds right after speaking with dr.hickling. dad just wanted to keep dr.hickling inform. Please contact if their are any questions or concerns.   PRESCRIPTION REFILL ONLY  Name of prescription:  Pharmacy:

## 2021-04-01 NOTE — Telephone Encounter (Signed)
Sleeping when he had his seizure Dad witnessed.  No incontinence  Took seizure medications last night, but is out of the one (picking up tomorrow)  Tired today, but otherwise okay.  Jerked and grunted, did not hit his head.

## 2021-04-03 NOTE — Telephone Encounter (Signed)
Patient had another seizure that dad wanted providers to know about and said that medicine had been picked up

## 2021-04-13 ENCOUNTER — Emergency Department (HOSPITAL_COMMUNITY)
Admission: EM | Admit: 2021-04-13 | Discharge: 2021-04-14 | Disposition: A | Discharge status: Home / Self Care | Payer: Medicaid Other | Attending: Emergency Medicine | Admitting: Emergency Medicine

## 2021-04-13 ENCOUNTER — Other Ambulatory Visit: Payer: Self-pay

## 2021-04-13 ENCOUNTER — Emergency Department (HOSPITAL_COMMUNITY): Payer: Medicaid Other

## 2021-04-13 ENCOUNTER — Encounter (HOSPITAL_COMMUNITY): Payer: Self-pay | Admitting: Emergency Medicine

## 2021-04-13 DIAGNOSIS — J069 Acute upper respiratory infection, unspecified: Secondary | ICD-10-CM

## 2021-04-13 DIAGNOSIS — U071 COVID-19: Secondary | ICD-10-CM | POA: Insufficient documentation

## 2021-04-13 DIAGNOSIS — Z7982 Long term (current) use of aspirin: Secondary | ICD-10-CM | POA: Insufficient documentation

## 2021-04-13 DIAGNOSIS — Z8616 Personal history of COVID-19: Secondary | ICD-10-CM | POA: Insufficient documentation

## 2021-04-13 LAB — CBC
HCT: 48.4 % (ref 39.0–52.0)
Hemoglobin: 16.1 g/dL (ref 13.0–17.0)
MCH: 30.9 pg (ref 26.0–34.0)
MCHC: 33.3 g/dL (ref 30.0–36.0)
MCV: 92.9 fL (ref 80.0–100.0)
Platelets: 299 10*3/uL (ref 150–400)
RBC: 5.21 MIL/uL (ref 4.22–5.81)
RDW: 13.2 % (ref 11.5–15.5)
WBC: 6 10*3/uL (ref 4.0–10.5)
nRBC: 0 % (ref 0.0–0.2)

## 2021-04-13 LAB — BASIC METABOLIC PANEL
Anion gap: 11 (ref 5–15)
BUN: 14 mg/dL (ref 6–20)
CO2: 27 mmol/L (ref 22–32)
Calcium: 9.3 mg/dL (ref 8.9–10.3)
Chloride: 98 mmol/L (ref 98–111)
Creatinine, Ser: 1.1 mg/dL (ref 0.61–1.24)
GFR, Estimated: >60 mL/min (ref >60)
Glucose, Bld: 100 mg/dL — ABNORMAL HIGH (ref 70–99)
Potassium: 3.5 mmol/L (ref 3.5–5.1)
Sodium: 136 mmol/L (ref 135–145)

## 2021-04-13 NOTE — ED Triage Notes (Addendum)
Pt/family reports one witnessed seizure this morning while in bed that lasted ~2 minutes. Family member reports this seizure was "more violent" that others and pt was post-ictal for a longer period of time afterward. Pt a&o x 4 in triage. Also reports new onset cough and nausea that began this evening. Pt reports compliance with seizure meds.

## 2021-04-13 NOTE — ED Notes (Signed)
Pt ambulatory in ED lobby. 

## 2021-04-14 LAB — SARS CORONAVIRUS 2 (TAT 6-24 HRS): SARS Coronavirus 2: POSITIVE — AB

## 2021-04-14 NOTE — Telephone Encounter (Signed)
I called and spoke with Franklin [name] and his father. He was seen at Urgent Care for cough yesterday and is Covid positive. He also had a seizure yesterday morning. [name] said that he had stopped taking Fycompa because it wasn't helping. He apparently stopped it at 2mg  and did not did not increase it as directed. I told them that I will contact the [place] patient assistance program and see if he can get a shipment or if we need to try something else. I will call them back tomorrow when I have more information. TG

## 2021-04-14 NOTE — Telephone Encounter (Signed)
See recent phone note. TG 

## 2021-04-14 NOTE — ED Provider Notes (Signed)
[facility]-EMERGENCY DEPT Provider Note  CSN: [id number] Arrival date & time: 04/13/21 1950  Chief Complaint(s) Seizures and Cough  HPI Franklin [name] is a 25 y.o. male with a past medical history listed below including seizures on AEM here for cough.   Cough Cough characteristics:  Productive Sputum characteristics:  Clear Severity:  Mild Onset quality:  Gradual Duration:  2 days Timing:  Intermittent Progression:  Waxing and waning Chronicity:  New Smoker: no   Context: upper respiratory infection   Context: not sick contacts   Relieved by:  None tried Worsened by:  Nothing Ineffective treatments:  None tried Associated symptoms: rhinorrhea   Associated symptoms: no chest pain, no chills, no ear fullness, no ear pain, no eye discharge, no fever, no headaches, no myalgias, no rash, no shortness of breath, no sinus congestion, no sore throat and no wheezing    In triage, they also reported having a seizure this morning.  Only lasted several minutes.  Typical for his seizures.  Patient has been compliant with his medications. This was not the reason for their visit today.  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 Authorizing Provider  acetaminophen (TYLENOL) 325 MG tablet Take 2 tablets (650 mg total) by mouth every 4 (four) hours as needed for  mild pain (or temp >/= 99.5). Patient not taking: No sig reported 05/14/19   Albertine GratesXu, Fang, MD  aspirin EC 325 MG tablet Take 325 mg by mouth daily.    [provider]  feeding supplement, ENSURE ENLIVE, (ENSURE ENLIVE) LIQD Take 237 mLs by mouth 2 (two) times daily between meals. Patient not taking: No sig reported 05/14/19   Albertine GratesXu, Fang, MD  [facility] 2 MG tablet Take 1 tablet at bedtime for 2 weeks, then take 2 tablets at bedtime for 2 weeks, then take 3 tablets at bedtime Patient not taking: Reported on 02/20/2021 12/31/20   Elveria RisingGoodpasture, Tina, NP  FYCOMPA 2 MG tablet Take 1 tablet (2 mg total) by mouth at bedtime. Patient not taking: Reported on 02/20/2021 12/31/20   Elveria RisingGoodpasture, Tina, NP  LAMICTAL XR 100 MG TB24 24 hour tablet Take 1 tablet every morning along with Lamictal XR 250mg  03/24/21   Elveria RisingGoodpasture, Tina, NP  LAMICTAL XR 200 MG TB24 24 hour tablet Take 1 tablet at night along with a Lamictal XR 300mg  tablet 03/24/21   Elveria RisingGoodpasture, Tina, NP  LAMICTAL XR 250 MG TB24 24 hour tablet Take 1 tablet in the morning along with Lamictal XR 100mg  03/24/21   Elveria RisingGoodpasture, Tina, NP  LAMICTAL XR 300 MG TB24 24 hour tablet Take 1 tablet at night along with Lamictal XR 200mg  03/24/21   Elveria RisingGoodpasture, Tina, NP  levETIRAcetam (KEPPRA) 500 MG tablet 3 tablets in the morning, 3 tablets in the evening 03/24/21   Elveria RisingGoodpasture, Tina, NP  Magnesium Oxide 400 (240 Mg) MG TABS Take 1 tablet (400  mg total) by mouth daily. Patient not taking: Reported on 02/20/2021 05/14/19   Albertine Grates, MD  meclizine (ANTIVERT) 25 MG tablet Take 1 tablet (25 mg total) by mouth 3 (three) times daily as needed for dizziness. Patient not taking: No sig reported 10/04/20   Roxy Horseman, PA-C  ondansetron (ZOFRAN ODT) 4 MG disintegrating tablet Take 1 tablet (4 mg total) by mouth every 8 (eight) hours as needed for nausea or vomiting. Patient not taking: No sig reported 10/04/20   Roxy Horseman, PA-C  potassium chloride SA (K-DUR) 20 MEQ tablet Take  2 tablets (40 mEq total) by mouth every Monday, Wednesday, and Friday. Patient not taking: No sig reported 05/15/19   Albertine Grates, MD  prednisoLONE acetate (PRED FORTE) 1 % ophthalmic suspension Place 1 drop into the left eye every hour. Patient not taking: No sig reported 12/29/19   Rennis Chris, MD                                                                                                                                    Past Surgical History Past Surgical History:  Procedure Laterality Date   Central line placement     As premature infant   REFRACTIVE SURGERY     For retinopathy of prematurity   Family History Family History  Problem Relation Age of Onset   Diabetes type II Father    Hypercholesterolemia Father    Hypertension Father    Stroke Paternal Grandmother    Autism Brother        Also has cognitive delays   Stroke Paternal Aunt     Social History Social History   Tobacco Use   Smoking status: Never   Smokeless tobacco: Never  Substance Use Topics   Alcohol use: No   Drug use: No   Allergies Concerta [methylphenidate]  Review of Systems Review of Systems  Constitutional:  Negative for chills and fever.  HENT:  Positive for rhinorrhea. Negative for ear pain and sore throat.   Eyes:  Negative for discharge.  Respiratory:  Positive for cough. Negative for shortness of breath and wheezing.   Cardiovascular:  Negative for chest pain.  Musculoskeletal:  Negative for myalgias.  Skin:  Negative for rash.  Neurological:  Negative for headaches.  All other systems are reviewed and are negative for acute change except as noted in the HPI  Physical Exam Vital Signs  I have reviewed the triage vital signs BP 120/79   Pulse 99   Temp 99.8 F (37.7 C) (Oral)   Resp 17   Ht 5\' 9"  (1.753 m)   Wt 59.4 kg   SpO2 98%   BMI 19.35 kg/m   Physical Exam Vitals reviewed.  Constitutional:      General: He is not in acute distress.    Appearance: He is  well-developed. He is not diaphoretic.  HENT:     Head: Normocephalic and atraumatic.  Right Ear: Tympanic membrane normal.     Left Ear: Tympanic membrane normal.     Nose: Congestion and rhinorrhea present.     Mouth/Throat:     Lips: No lesions.     Mouth: No oral lesions.     Tongue: No lesions.     Palate: No lesions.     Pharynx: No pharyngeal swelling or posterior oropharyngeal erythema.     Tonsils: No tonsillar exudate.     Comments: Post nasal drip with cobblestoning Eyes:     General: No scleral icterus.       Right eye: No discharge.        Left eye: No discharge.     Conjunctiva/sclera: Conjunctivae normal.     Pupils: Pupils are equal, round, and reactive to light.  Cardiovascular:     Rate and Rhythm: Normal rate and regular rhythm.     Heart sounds: No murmur heard.   No friction rub. No gallop.  Pulmonary:     Effort: Pulmonary effort is normal. No respiratory distress.     Breath sounds: Normal breath sounds. No stridor. No rales.  Abdominal:     General: There is no distension.     Palpations: Abdomen is soft.     Tenderness: There is no abdominal tenderness.  Musculoskeletal:        General: No tenderness.     Cervical back: Normal range of motion and neck supple.  Skin:    General: Skin is warm and dry.     Findings: No erythema or rash.  Neurological:     Mental Status: He is alert and oriented to person, place, and time.    ED Results and Treatments Labs (all labs ordered are listed, but only abnormal results are displayed) Labs Reviewed  BASIC METABOLIC PANEL - Abnormal; Notable for the following components:      Result Value   Glucose, Bld 100 (*)    All other components within normal limits  SARS CORONAVIRUS 2 (TAT 6-24 HRS)  CBC                                                                                                                         EKG  EKG Interpretation  Date/Time:    Ventricular Rate:    PR Interval:    QRS  Duration:   QT Interval:    QTC Calculation:   R Axis:     Text Interpretation:         Radiology DG Chest 2 View  Result Date: 04/13/2021 CLINICAL DATA:  Two days of cough and phlegm.  Seizure today EXAM: CHEST - 2 VIEW COMPARISON:  October 04, 2020 FINDINGS: The heart size and mediastinal contours are within normal limits. No focal consolidation. No pleural effusion. No pneumothorax. The visualized skeletal structures are unremarkable. IMPRESSION: No active cardiopulmonary disease. Electronically Signed   By: Maudry Mayhew MD   On: 04/13/2021 20:48    Pertinent labs & imaging  results that were available during my care of the patient were reviewed by me and considered in my medical decision making (see MDM for details).  Medications Ordered in ED Medications - No data to display                                                                                                                                   Procedures Procedures  (including critical care time)  Medical Decision Making / ED Course I have reviewed the nursing notes for this encounter and the patient's prior records (if available in EHR or on provided paperwork).  Franklin [name] was evaluated in Emergency Department on 04/14/2021 for the symptoms described in the history of present illness. He was evaluated in the context of the global COVID-19 pandemic, which necessitated consideration that the patient might be at risk for infection with the SARS-CoV-2 virus that causes COVID-19. Institutional protocols and algorithms that pertain to the evaluation of patients at risk for COVID-19 are in a state of rapid change based on information released by regulatory bodies including the CDC and federal and state organizations. These policies and algorithms were followed during the patient's care in the ED.     Patient presents with viral symptoms for 2 days. adequate oral hydration. Rest of history as above.  Patient appears well.  No signs of toxicity, patient is interactive. No hypoxia, tachypnea or other signs of respiratory distress. No sign of clinical dehydration. Lung exam clear. Rest of exam as above.  During triage patient had labs and a chest x-ray obtained. Labs grossly reassuring without leukocytosis or anemia.  No significant electrolyte derangements or renal sufficiency. Chest x-ray without evidence of pneumonia.  Pertinent labs & imaging results that were available during my care of the patient were reviewed by me and considered in my medical decision making:  Most consistent with viral illness.  COVID/influenza swab obtained.  They can see the results on MyChart.  No evidence suggestive of pharyngitis, AOM, PNA, or meningitis.  Discussed symptomatic treatment with the patient and they will follow closely with their PCP.      Final Clinical Impression(s) / ED Diagnoses Final diagnoses:  URI with cough and congestion   The patient appears reasonably screened and/or stabilized for discharge and I doubt any other medical condition or other [facility] requiring further screening, evaluation, or treatment in the ED at this time prior to discharge. Safe for discharge with strict return precautions.  Disposition: Discharge  Condition: Good  I have discussed the results, Dx and Tx plan with the patient/family who expressed understanding and agree(s) with the plan. Discharge instructions discussed at length. The patient/family was given strict return precautions who verbalized understanding of the instructions. No further questions at time of discharge.    ED Discharge Orders     None        Follow Up: Primary care provider  Call  as needed  This chart was dictated using voice recognition software.  Despite best efforts to proofread,  errors can occur which can change the documentation meaning.    Nira Conn, MD 04/14/21 [phone]

## 2021-04-16 NOTE — Telephone Encounter (Signed)
I called and learned that Franklin [name] is still enrolled in [place] patient assistance but needs to call [phone] to have medication delivered to him. I called Franklin [name] and let him know and asked him to let me know when Franklin [name] receives the medication. I explained again that Franklin [name] must start at 2mg  per day for 2 weeks, then increase by 1 tablet every 2 weeks until he reaches 6mg . If the medication is helpful, we may continue to increase the dose to max of 12mg . TG

## 2021-05-06 ENCOUNTER — Telehealth (INDEPENDENT_AMBULATORY_CARE_PROVIDER_SITE_OTHER): Payer: Self-pay | Admitting: Family

## 2021-05-06 NOTE — Telephone Encounter (Signed)
Spoke with dad and he informs seizure was this morning before he took his Lamictal. He currently is unable to take his Keppra due to the cost. EMS was not called, and emergency medication was not administered. Patient is tired and groggy.

## 2021-05-06 NOTE — Telephone Encounter (Signed)
Who's calling (name and relationship to patient) : Rodriquez Thorner dad  Best contact number: [phone]  Provider they see: Elveria Rising  Reason for call: Patient had a seizure that lasted about a minute long.   Call ID:      PRESCRIPTION REFILL ONLY  Name of prescription:  Pharmacy:

## 2021-05-08 NOTE — Telephone Encounter (Signed)
I called and talked with Dad. He said that he was able to get the Levetiracetam refilled yesterday. He believes that Siddharth is taking the Fycompa but will verity that with him. I reminded Dad that it has to be refilled monthly and that it may be time to request a refill. Dad agreed to check on that. I asked Dad to continue to let me know if Rue has seizures. TG

## 2021-06-02 ENCOUNTER — Telehealth (INDEPENDENT_AMBULATORY_CARE_PROVIDER_SITE_OTHER): Payer: Self-pay | Admitting: Family

## 2021-06-02 DIAGNOSIS — G40309 Generalized idiopathic epilepsy and epileptic syndromes, not intractable, without status epilepticus: Secondary | ICD-10-CM

## 2021-06-02 MED ORDER — FYCOMPA 8 MG PO TABS: ORAL_TABLET | ORAL | 5 refills | Status: DC

## 2021-06-02 NOTE — Telephone Encounter (Signed)
Spoke with dad to see what pharmacy he would like it to be sent through so they can send it to his home. Dad informs that Inetta Fermo is aware of what pharmacy it would need to go to and it can be discussed with her.

## 2021-06-02 NOTE — Telephone Encounter (Signed)
I called and spoke with Dad. He said that Franklin [name] ran out of Fycompa at the end of last week and had a seizure on Saturday. He has been taking Fycompa 6mg  using 2mg  tablets. I told Dad that I will send in Rx for new strength of 8mg . Dad also asked to change his appointment on Weds to virtual as he had no gas to come to an appointment in person. TG

## 2021-06-02 NOTE — Telephone Encounter (Signed)
Telephone  MRN: [id number]  Patient father states that the pt is out of meds (Fycompa) and needs a refill and would like for it to be sent to the house.  He can be reached at [phone].

## 2021-06-04 ENCOUNTER — Telehealth (INDEPENDENT_AMBULATORY_CARE_PROVIDER_SITE_OTHER): Payer: Self-pay | Admitting: Family

## 2021-06-04 DIAGNOSIS — G40309 Generalized idiopathic epilepsy and epileptic syndromes, not intractable, without status epilepticus: Secondary | ICD-10-CM

## 2021-06-04 DIAGNOSIS — Z87898 Personal history of other specified conditions: Secondary | ICD-10-CM

## 2021-06-04 DIAGNOSIS — F819 Developmental disorder of scholastic skills, unspecified: Secondary | ICD-10-CM

## 2021-06-04 DIAGNOSIS — F411 Generalized anxiety disorder: Secondary | ICD-10-CM

## 2021-06-04 NOTE — Progress Notes (Signed)
This is a Pediatric Specialist E-Visit follow up consult provided via telephone Franklin [name] and their parent/guardian Franklin [name] (name of consenting adult) consented to an E-Visit consult today.  Location of patient: Franklin [name] is at home Location of provider: Elveria Rising, NP-C is at office  The following participants were involved in this E-Visit: NP, patient and his father  This visit was done via TELEPHONE as he was unable to access the video visit.   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 He is taking and tolerating Lamictal XR that he receives from [employer] Patient Assistance Program and Levetiracetam that he purchases from the pharmacy. He has no insurance coverage. He was approved for [employer] Patient Assistance Program for [place].   Today's concerns: Franklin [name] and his father report today that he had been doing well until last week when he had a seizure in the setting of missed doses of Fycompa. He ran out of the medication and didn't remember how to get a refill from the patient assistance company. I updated the Fycompa Rx with the patient assistance company and I gave [place] instructions on how to get the refills. He has to request the refill monthly before he runs out of medication.   Franklin [name] has been otherwise generally healthy since he was last seen. Neither he nor his father have other health concerns for him today other than previously mentioned.  Review of systems: Please see HPI for neurologic and other  pertinent review of systems. Otherwise all other systems were reviewed and were negative.  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 He was tried on Concerta which caused significant emotional disturbance, then on Ritalin, then Adderall which worked well. 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   Birth History: He was born at [redacted] weeks gestation, weighing 1 lb 13 1/2 oz. He required ventilator support for sometime. He reportedly had no CNS hemorrhage. Growth and development was delayed.   Surgical history: Past Surgical History:  Procedure Laterality Date   Central line placement     As premature infant   REFRACTIVE  SURGERY     For retinopathy of prematurity     Family history: family history includes Autism in his brother; Diabetes type II in his father; Hypercholesterolemia in his father; Hypertension in his father; Stroke in his paternal aunt and paternal grandmother.   Social history: Social History   Socioeconomic History   Marital status:  Single    Spouse name: Not on file   Number of children: Not on file   Years of education: Not on file   Highest education level: Not on file  Occupational History   Not on file  Tobacco Use   Smoking status: Never   Smokeless tobacco: Never  Substance and Sexual Activity   Alcohol use: No   Drug use: No   Sexual activity: Not Currently  Other Topics Concern   Not on file  Social History Narrative   Parents are divorced. Franklin [name] lives with his father.   Social Determinants of Health   Financial Resource Strain: Not on file  Food Insecurity: Not on file  Transportation Needs: Not on file  Physical Activity: Not on file  Stress: Not on file  Social Connections: Not on file  Intimate Partner Violence: Not on file    Past/failed meds: Copied from previous record:  Allergies: Allergies  Allergen Reactions   Concerta [Methylphenidate] Other (See Comments)    Would start crying for little things like when someone coughed or said a specific word for example.      Immunizations: Immunization History  Administered Date(s) Administered   PFIZER(Purple Top)SARS-COV-2 Vaccination 12/08/2019, 01/01/2020   Tdap 06/30/2015, 09/14/2018     Diagnostics/Screenings: Copied from previous record: EEG in 2006 - generalized convulsive epilepsy  Physical Exam: There were no vitals taken for this visit.  There was no examination as it was a telephone visit.  Impression: Generalized convulsive epilepsy (HCC)  Intellectual delay  Personal history of prematurity  Generalized anxiety disorder    Recommendations for plan of care: The patient's previous [facility] records were reviewed. [place] has neither had nor required imaging or lab studies since the last visit. He is a 25 year old young man with history of prematurity, intellectual delay, and epilepsy. Treatment has been limited by his lack of medical insurance. [place]air is taking and tolerating Lamictal XR through a patient assistance  program, Levetiracetam that he buys using a discount program and Fycompa that he obtains from a patient assistance program. He had seizures last week in the setting of missed doses of Fycompa when he ran out of medication. I talked with Franklin [name] and his father and explained that he has to request a refill each month from the patient assistance program in order to receive Fycompa. I recommended putting a reminder in his phone or a note in a prominent place in his home to help him remember to do this next month. I asked Dad to continue to let me know when Franklin [name] has seizures. I will see him back in follow up in 3 months or sooner if needed. Franklin [name] and his father agreed with the plans made today.   The medication list was reviewed and reconciled. No changes were made in the prescribed medications today. A complete medication list was provided to the patient.  No follow-ups on file.   Allergies as of 06/04/2021       Reactions   Concerta [methylphenidate] Other (See Comments)   Would start crying for little things like when someone coughed or said a specific word for example.  Medication List        Accurate as of June 04, 2021  2:19 PM. If you have any questions, ask your nurse or doctor.          acetaminophen 325 MG tablet Commonly known as: TYLENOL Take 2 tablets (650 mg total) by mouth every 4 (four) hours as needed for mild pain (or temp >/= 99.5).   aspirin EC 325 MG tablet Take 325 mg by mouth daily.   feeding supplement Liqd Take 237 mLs by mouth 2 (two) times daily between meals.   Fycompa 8 MG tablet Generic drug: perampanel Take 1 tablet at bedtime   LaMICtal XR 100 MG Tb24 24 hour tablet Generic drug: LamoTRIgine Take 1 tablet every morning along with Lamictal XR 250mg    LaMICtal XR 200 MG Tb24 24 hour tablet Generic drug: LamoTRIgine Take 1 tablet at night along with a Lamictal XR 300mg  tablet   LaMICtal XR 250 MG Tb24 24 hour tablet Generic drug:  LamoTRIgine Take 1 tablet in the morning along with Lamictal XR 100mg    LaMICtal XR 300 MG Tb24 24 hour tablet Generic drug: LamoTRIgine Take 1 tablet at night along with Lamictal XR 200mg    levETIRAcetam 500 MG tablet Commonly known as: KEPPRA 3 tablets in the morning, 3 tablets in the evening   magnesium oxide 400 (240 Mg) MG tablet Commonly known as: MAG-OX Take 1 tablet (400 mg total) by mouth daily.   meclizine 25 MG tablet Commonly known as: ANTIVERT Take 1 tablet (25 mg total) by mouth 3 (three) times daily as needed for dizziness.   ondansetron 4 MG disintegrating tablet Commonly known as: Zofran ODT Take 1 tablet (4 mg total) by mouth every 8 (eight) hours as needed for nausea or vomiting.   potassium chloride SA 20 MEQ tablet Commonly known as: KLOR-CON Take 2 tablets (40 mEq total) by mouth every Monday, Wednesday, and Friday.   prednisoLONE acetate 1 % ophthalmic suspension Commonly known as: PRED FORTE Place 1 drop into the left eye every hour.       Total time spent on the phone with the patient was 10 minutes, of which 50% or more was spent in counseling and coordination of care.  NP-C [facility] Ph. [phone] Fax [phone]

## 2021-06-05 ENCOUNTER — Encounter (INDEPENDENT_AMBULATORY_CARE_PROVIDER_SITE_OTHER): Payer: Self-pay | Admitting: Family

## 2021-06-05 NOTE — Patient Instructions (Signed)
Thank you for talking with me by phone today.   Instructions for you until your next appointment are as follows: You must call the Fycompa patient assistance program at [phone] each month to request a refill. You need to do this about a week before you are going to run out of medication so they will have time to ship it to you.  Continue taking your other medications as prescribed.  Remember that it is important for you to get at least 8 hours of sleep each night as missing sleep can trigger seizures.  Please sign up for MyChart if you have not done so. Please plan to return for follow up in 3 months or sooner if needed.  At [facility], we are committed to providing exceptional care. You will receive a patient satisfaction survey through text or email regarding your visit today. Your opinion is important to me. Comments are appreciated.

## 2021-06-27 ENCOUNTER — Telehealth (INDEPENDENT_AMBULATORY_CARE_PROVIDER_SITE_OTHER): Payer: Self-pay | Admitting: Family

## 2021-06-27 NOTE — Telephone Encounter (Signed)
  Who's calling (name and relationship to patient) : Franklin [name]; dad  Best contact number: [phone]  Provider they see: Dr. Blane Ohara  Reason for call: Dad has called in, dad has requested to speak with Inetta Fermo.    PRESCRIPTION REFILL ONLY  Name of prescription:  Pharmacy:

## 2021-06-27 NOTE — Telephone Encounter (Signed)
I called and left a message for Dad. I will call him again on Monday. TG

## 2021-06-27 NOTE — Telephone Encounter (Signed)
Left voicemail for dad to call back.

## 2021-06-30 NOTE — Telephone Encounter (Signed)
I called and spoke with Dad. He could not remember why he called me last week. I reminded him to call for Fycompa refill to [phone]. I invited Dad to call back if he has further questions. TG

## 2021-08-04 ENCOUNTER — Telehealth (INDEPENDENT_AMBULATORY_CARE_PROVIDER_SITE_OTHER): Payer: Self-pay | Admitting: Family

## 2021-08-04 NOTE — Telephone Encounter (Signed)
Forms to apply for Medicaid were returned to the office but family did not complete them. Not sure if that is the forms referred to. These were mailed to them on 07/07/2021.

## 2021-08-04 NOTE — Telephone Encounter (Signed)
I called and spoke to Dad. He was unsure about what needed to be done with the Medicaid forms. I told him that I will talk with Maralyn Sago, case manager tomorrow and call him back. He also asked about a therapist for [place] and I explained that we are hiring for an [facility] provider at this time and that I will let him know when I can arrange something for him. He agreed with this plan. TG

## 2021-08-04 NOTE — Telephone Encounter (Signed)
  Who's calling (name and relationship to patient) : Natividad, Hall[place] Best contact number: [phone] Provider they see: Blane Ohara Reason for call:  Forms were dropped off for review please contact dad when completed. Please also speak with patient about therapist    PRESCRIPTION REFILL ONLY  Name of prescription:  Pharmacy:

## 2021-08-04 NOTE — Telephone Encounter (Signed)
Forms are not available in Tina's box.

## 2021-08-11 ENCOUNTER — Other Ambulatory Visit (INDEPENDENT_AMBULATORY_CARE_PROVIDER_SITE_OTHER): Payer: Self-pay | Admitting: Family

## 2021-08-11 DIAGNOSIS — G40309 Generalized idiopathic epilepsy and epileptic syndromes, not intractable, without status epilepticus: Secondary | ICD-10-CM

## 2021-08-11 MED ORDER — LAMICTAL XR 300 MG PO TB24: ORAL_TABLET | ORAL | 3 refills | Status: DC

## 2021-08-11 MED ORDER — LAMICTAL XR 100 MG PO TB24
ORAL_TABLET | ORAL | 3 refills | Status: DC
Start: 2021-08-11 — End: 2022-04-28

## 2021-08-11 MED ORDER — LAMICTAL XR 250 MG PO TB24: ORAL_TABLET | ORAL | 3 refills | Status: DC

## 2021-08-11 MED ORDER — LAMICTAL XR 200 MG PO TB24: ORAL_TABLET | ORAL | 3 refills | Status: DC

## 2021-08-11 NOTE — Telephone Encounter (Signed)
I called and spoke with Alvan and his father. He is out of Lamictal that he gets from [employer] patient assistance. I will fax the prescriptions today. TG

## 2021-08-11 NOTE — Telephone Encounter (Signed)
Who's calling (name and relationship to patient) : Franklin [name] dad  Best contact number: [phone]  Provider they see: Elveria Rising  Reason for call: Out of medication.  Follow up appt made.  Would like to hear from [place].  Call ID:      PRESCRIPTION REFILL ONLY  Name of prescription: Lamictal   Pharmacy: Dad doesn't know who delivers it to house

## 2021-08-11 NOTE — Telephone Encounter (Signed)
Received voicemail concerning follow up on medication request

## 2021-08-14 NOTE — Telephone Encounter (Signed)
Locke and his father came in to sign the Medicaid forms. The forms were mailed to [facility] for processing. TG

## 2021-08-20 ENCOUNTER — Telehealth (INDEPENDENT_AMBULATORY_CARE_PROVIDER_SITE_OTHER): Payer: Self-pay | Admitting: Family

## 2021-08-20 NOTE — Telephone Encounter (Signed)
I called and spoke with Franklin [name]. He said that he missed doses of Lamictal and Lamictal XR last week when he ran out of medication but that he received the medication a few days ago and is now taking it regularly. He is also taking Fycompa and has not missed any doses. He says that he has about a week left of Fycompa. I gave [name] the phone number to call to get a refill from [place] patient assistance. TG

## 2021-08-20 NOTE — Telephone Encounter (Signed)
Spoke to dad : Dad states that patients seizures were just his body tensing up and relaxing multiple times. They lasted about 10-15 seconds.And a couple hours apart.  Dad states that he doesn't know if [name] missed any does of medication or not.  Franklin [name] had a headache and fell asleep after the seizures.

## 2021-08-20 NOTE — Telephone Encounter (Signed)
°  Who's calling (name and relationship to patient) : Father   Best contact number:[phone]   Provider they see: Inetta Fermo   Reason for call:Dad want to inform Inetta Fermo that Tyee has had two seizures today     PRESCRIPTION REFILL ONLY  Name of prescription:  Pharmacy:

## 2021-09-04 ENCOUNTER — Telehealth (INDEPENDENT_AMBULATORY_CARE_PROVIDER_SITE_OTHER): Payer: Self-pay | Admitting: Family

## 2021-09-04 NOTE — Telephone Encounter (Signed)
°  Who's calling (name and relationship to patient) : Franklin [name]; mom  Best contact number: [phone]  Provider they see: [facility]  Reason for call: Mom has called in wanting for [facility] to call back asap.    PRESCRIPTION REFILL ONLY  Name of prescription:  Pharmacy:

## 2021-09-04 NOTE — Telephone Encounter (Signed)
I called and spoke with Darald's Mom. She said that she was very worried about him because he had gotten caught stealing items. She feels that Eason would not tolerate jail and would likely be abused in jail if he was prosecuted. She also said that Tommaso has said that he wants to die and that he has thought about cutting his wrists. I talked with Mom about these things and recommended that she contact [employer] for help for [place]. I also told her about the [facility] facility if Dillin had more suicidal talk. She agreed with these plans. TG

## 2021-09-10 ENCOUNTER — Encounter (HOSPITAL_COMMUNITY): Payer: Self-pay | Admitting: Registered Nurse

## 2021-09-10 ENCOUNTER — Ambulatory Visit (HOSPITAL_COMMUNITY)
Admission: EM | Admit: 2021-09-10 | Discharge: 2021-09-10 | Disposition: A | Discharge status: Home / Self Care | Payer: No Payment, Other | Attending: Registered Nurse | Admitting: Registered Nurse

## 2021-09-10 DIAGNOSIS — R45851 Suicidal ideations: Secondary | ICD-10-CM | POA: Insufficient documentation

## 2021-09-10 DIAGNOSIS — Z9152 Personal history of nonsuicidal self-harm: Secondary | ICD-10-CM | POA: Insufficient documentation

## 2021-09-10 DIAGNOSIS — R569 Unspecified convulsions: Secondary | ICD-10-CM | POA: Insufficient documentation

## 2021-09-10 DIAGNOSIS — F411 Generalized anxiety disorder: Secondary | ICD-10-CM | POA: Diagnosis present

## 2021-09-10 DIAGNOSIS — F4323 Adjustment disorder with mixed anxiety and depressed mood: Secondary | ICD-10-CM | POA: Diagnosis present

## 2021-09-10 NOTE — ED Notes (Signed)
Pt presents to [facility] with symptoms of depression and medication management for his seizures. Pt states that he is having mood swings, anger issues, and has passive thoughts of SI. Pt denies HI and AVH. Pt is urgent

## 2021-09-10 NOTE — Discharge Instructions (Signed)
Safety Plan HAJI DELAINE will reach out to his father or aunt, call 911 or call mobile crisis, or go to nearest emergency room if condition worsens or if suicidal thoughts become active Patients' will follow up with [facility] for outpatient psychiatric services (therapy/medication management).  The suicide prevention education provided includes the following: Suicide risk factors Suicide prevention and interventions National Suicide Hotline telephone number [facility] assessment telephone number [facility] Emergency Assistance 911 [facility] and/or Residential Mobile Crisis Unit telephone number Request made of family/significant other to:  Patient and Father Remove weapons (e.g., guns, rifles, knives), all items previously/currently identified as safety concern.   Remove drugs/medications (over the counter, prescriptions, illicit drugs), all items previously/currently identified as a safety concern.

## 2021-09-10 NOTE — ED Provider Notes (Signed)
Behavioral Health Urgent Care Medical Screening Exam  Patient Name: Franklin [name] MRN: [id number] Date of Evaluation: 09/10/21 Chief Complaint:   Diagnosis:  Final diagnoses:  Adjustment disorder with mixed anxiety and depressed mood  Generalized anxiety disorder    History of Present illness: Franklin [name] is a 26 y.o. male patient presented to [facility] as a walk in accompanied by his father with complaints of depression and passive suicidal ideation  Franklin [name], 26 y.o., male patient seen face to face by this provider, consulted with Dr. Ernie Hew; and chart reviewed on 09/10/21.  On evaluation Franklin [name] reports he has been feeling depressed and want to get on medication for depression.  Patient states he doesn't want to kill himself but sometimes thinks "I wish I would die in my sleep or I don't care if I don't wake up."  Patient states he is unaware of what his stressors are.  Stats he sometimes sees things "one my grandpa that he never may came to the living room in the house where he used to live and now live and I had never met him before, when I saw him I was glad that family get a chance to meet him."  Patient denies any visual hallucinations at this time patient also denies auditory hallucinations.  Patient denies alcohol and illicit drug use; reporting he only takes his medications that he is prescribed by his neurologist for his seizures.  Patient denies a history of self-harming behaviors.  Reports he is never had inpatient psychiatric treatment and is only received therapy once.  Patient reports that he lives with his father, aunt, and a cousin "and I dog and cat.."  Patient reports that his father is his guardian "He takes care of me.." During evaluation Franklin [name] is sitting upright in chair swinging his legs back-and-forth no noted distress.  He is alert/oriented x 4; calm/cooperative; and mood congruent with affect.  He is speaking in a clear tone at moderate  volume, and normal pace; with good eye contact.  His thought process is coherent and relevant; There is no indication that he is currently responding to internal/external stimuli or experiencing delusional thought content; and he denies suicidal/self-harm/homicidal ideation, psychosis, and paranoia.  Patient has remained calm throughout assessment and has answered questions appropriately.  Patient gave permission to speak to his father for collateral information  Patient's father states that his only concern is patient has been making suicidal statements about not wanting to wake up.  Reports he feels patient is safe but does want to have outpatient psychiatric services for medication management and therapy.  Discussed [facility] behavioral health center open access for walk-ins for medication management and therapy.  Informed other resources would also be given Discussed safety plan:  See below   Psychiatric Specialty Exam  Presentation  General Appearance:Appropriate for Environment; Casual  Eye Contact:Good  Speech:Clear and Coherent; Normal Rate  Speech Volume:Normal  Handedness:Right   Mood and Affect  Mood:Anxious; Depressed  Affect:Appropriate; Congruent   Thought Process  Thought Processes:Coherent; Goal Directed  Descriptions of Associations:Intact  Orientation:Full (Time, Place and Person)  Thought Content:Logical    Hallucinations:None  Ideas of Reference:None  Suicidal Thoughts:Yes, Passive Without Intent; Without Plan; Without Means to Carry Out  Homicidal Thoughts:No   Sensorium  Memory:No data recorded Judgment:Intact  Insight:Present   Executive Functions  Concentration:Good  Attention Span:Good  [place]  Language:Good   Psychomotor Activity  Psychomotor Activity:Normal  Assets  Assets:Communication Skills; Desire for Improvement; Housing; Social Support   Sleep  Sleep:Good  Number of hours: No  data recorded  Nutritional Assessment (For OBS and FBC admissions only) Has the patient had a weight loss or gain of 10 pounds or more in the last 3 months?: No Has the patient had a decrease in food intake/or appetite?: No Does the patient have dental problems?: No Does the patient have eating habits or behaviors that may be indicators of an eating disorder including binging or inducing vomiting?: No Has the patient recently lost weight without trying?: 0 Has the patient been eating poorly because of a decreased appetite?: 0 Malnutrition Screening Tool Score: 0    Physical Exam: Physical Exam Vitals and nursing note reviewed. Exam conducted with a chaperone present.  Constitutional:      General: He is not in acute distress.    Appearance: Normal appearance. He is not ill-appearing.  Cardiovascular:     Rate and Rhythm: Normal rate.  Pulmonary:     Effort: Pulmonary effort is normal.  Musculoskeletal:        General: Normal range of motion.     Cervical back: Normal range of motion.  Skin:    General: Skin is warm and dry.  Neurological:     Mental Status: He is alert and oriented to person, place, and time.  Psychiatric:        Attention and Perception: Attention and perception normal. He does not perceive auditory or visual hallucinations.        Mood and Affect: Mood is depressed.        Speech: Speech normal.        Behavior: Behavior normal. Behavior is cooperative.        Thought Content: Thought content normal. Thought content is not paranoid or delusional. Thought content does not include homicidal or suicidal (passive thoughts sometimes) ideation.        Cognition and Memory: Cognition normal.        Judgment: Judgment is impulsive.   Review of Systems  Constitutional: Negative.   HENT: Negative.    Eyes: Negative.   Respiratory: Negative.    Cardiovascular: Negative.   Gastrointestinal: Negative.   Genitourinary: Negative.   Musculoskeletal: Negative.    Skin: Negative.   Neurological:  Positive for seizures.  Endo/Heme/Allergies: Negative.   Psychiatric/Behavioral:  Positive for depression. Negative for hallucinations. Substance abuse: Passive suicidal thoughts sometimes but not now.Nervous/anxious: Stable.   Blood pressure 119/78, pulse 70, temperature 97.8 F (36.6 C), temperature source Oral, resp. rate 18, SpO2 100 %. There is no height or weight on file to calculate BMI.  Musculoskeletal: Strength & Muscle Tone: within normal limits Gait & Station: normal Patient leans: N/A   [facility] MSE Discharge Disposition for Follow up and Recommendations: Based on my evaluation the patient does not appear to have an emergency medical condition and can be discharged with resources and follow up care in outpatient services for Medication Management, Individual Therapy, and Group Therapy    Discharge Instructions      Safety Plan Franklin [name] will reach out to his father or aunt, call 911 or call mobile crisis, or go to nearest emergency room if condition worsens or if suicidal thoughts become active Patients' will follow up with [facility] for outpatient psychiatric services (therapy/medication management).  The suicide prevention education provided includes the following: Suicide risk factors Suicide prevention and interventions National Suicide Hotline telephone number [employer]  [facility] assessment telephone number [place] and/or Residential Mobile Crisis Unit telephone number Request made of family/significant other to:  Patient and Father Remove weapons (e.g., guns, rifles, knives), all items previously/currently identified as safety concern.   Remove drugs/medications (over the counter, prescriptions, illicit drugs), all items previously/currently identified as a safety concern.        [place].   Specialty: Urgent Care Why: New Therapy walkin:  Monday-Wednesday from 7:30am-12:30pm.  New Medication management walkin Monday-Friday from 7:30 am to 11:00am.  Patient will be taken in the order that they come.  You may not be seen on the same day as walkin. first come first Midwife information: [place].   Why: Walk in hours Monday thru Thursday 8:00 AM to 3:00 PM first come first serve Contact information: [place]  [place] [address] [phone]         Call  [place].   Specialty: Professional Counselor Why: schedule an appointment for medication management and therapy Contact information: [facility] [address] [phone]                  Earleen Newport, NP 09/10/2021, 3:42 PM

## 2021-09-12 ENCOUNTER — Telehealth (INDEPENDENT_AMBULATORY_CARE_PROVIDER_SITE_OTHER): Payer: Self-pay | Admitting: Family

## 2021-09-12 NOTE — Telephone Encounter (Signed)
Who's calling (name and relationship to patient) : [name] Hottinger dad  Best contact number: [phone]  Provider they see: Elveria Rising   Reason for call: Patient had two seizures today. Dad would like to talk with clinic staff.   Call ID:      PRESCRIPTION REFILL ONLY  Name of prescription:  Pharmacy:

## 2021-09-12 NOTE — Telephone Encounter (Signed)
I called but did not get an answer and no VM was available. I will call Dad again later today. TG

## 2021-09-12 NOTE — Telephone Encounter (Signed)
I called and once again did not get an answer. I will try to reach Dad again before I leave. TG

## 2021-09-12 NOTE — Telephone Encounter (Signed)
I tried to call dad again but did not get an answer. I will call him again on Monday. TG

## 2021-09-15 NOTE — Telephone Encounter (Signed)
I attempted to call Dad. There was no answer and no option for voicemail. He has an appointment with me on 09/22/21 so I will talk with him at that time. TG

## 2021-09-22 ENCOUNTER — Telehealth (HOSPITAL_COMMUNITY): Payer: Self-pay

## 2021-09-22 ENCOUNTER — Ambulatory Visit (INDEPENDENT_AMBULATORY_CARE_PROVIDER_SITE_OTHER): Payer: Medicaid Other | Admitting: Family

## 2021-09-22 NOTE — BH Assessment (Signed)
Care Management - [facility] Follow Up Discharges   Writer attempted to make contact with patient today and was unsuccessful.  No voicemail is set up.  Per chart review, patient was provided with outpatient resources at [facility] on the 2nd Floor for Open Access.

## 2021-10-13 ENCOUNTER — Other Ambulatory Visit (INDEPENDENT_AMBULATORY_CARE_PROVIDER_SITE_OTHER): Payer: Self-pay | Admitting: Family

## 2021-10-13 DIAGNOSIS — G40309 Generalized idiopathic epilepsy and epileptic syndromes, not intractable, without status epilepticus: Secondary | ICD-10-CM

## 2021-10-14 ENCOUNTER — Telehealth (INDEPENDENT_AMBULATORY_CARE_PROVIDER_SITE_OTHER): Payer: Self-pay | Admitting: Family

## 2021-10-14 NOTE — Telephone Encounter (Signed)
°  Who's calling (name and relationship to patient) : [name] - father  Best contact number: [phone]  Provider they see: Elveria Rising  Reason for call: Dad states that patient had seizure this morning lasting a couple of minutes. No loss of consciousness noted.    PRESCRIPTION REFILL ONLY  Name of prescription:  Pharmacy:

## 2021-10-14 NOTE — Telephone Encounter (Signed)
I called and spoke to Dad. He said that Franklin [name] stayed up very late last night watching TV. He believes that he has not missed any medication doses. I told Dad that I would not recommend making changes in his medication plan since [name] was sleep deprived. I asked Dad to continue to let me know when seizures occur. Dad agreed with the plans made today. TG

## 2021-10-22 ENCOUNTER — Encounter (INDEPENDENT_AMBULATORY_CARE_PROVIDER_SITE_OTHER): Payer: Self-pay | Admitting: Family

## 2021-10-22 ENCOUNTER — Other Ambulatory Visit: Payer: Self-pay

## 2021-10-22 ENCOUNTER — Ambulatory Visit (INDEPENDENT_AMBULATORY_CARE_PROVIDER_SITE_OTHER): Payer: Self-pay | Admitting: Family

## 2021-10-22 VITALS — BP 122/74 | Wt 133.0 lb

## 2021-10-22 DIAGNOSIS — G40309 Generalized idiopathic epilepsy and epileptic syndromes, not intractable, without status epilepticus: Secondary | ICD-10-CM

## 2021-10-22 DIAGNOSIS — F819 Developmental disorder of scholastic skills, unspecified: Secondary | ICD-10-CM

## 2021-10-22 DIAGNOSIS — F4323 Adjustment disorder with mixed anxiety and depressed mood: Secondary | ICD-10-CM

## 2021-10-22 DIAGNOSIS — Z5989 Other problems related to housing and economic circumstances: Secondary | ICD-10-CM

## 2021-10-22 DIAGNOSIS — Z72820 Sleep deprivation: Secondary | ICD-10-CM

## 2021-10-22 NOTE — Progress Notes (Signed)
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 He is taking and tolerating Lamictal XR that he receives from [place] Patient Assistance Program and Levetiracetam that he purchases from the pharmacy. This can be sporadic at times because he has no income and no insurance coverage. He also receives Fycompa from the [employer].  Today's concerns: Franklin [name] and his father report today that he has had 3 seizures since his last visit. He tends to have seizures in the setting of missed doses of medication, sleep deprivation and emotional upset. Franklin [name] reports that he gets dizzy after taking Fycompa in the evening but what when he awakens the following day the dizziness has resolved.   Franklin [name] has had problems with mood and suicidal ideation and was seen at the [facility]. He says that he is feeling better since that visit in January.   Since his last visit, a Medicaid application was completed and mailed. Dad reports today that he has not been notified if he will be approved.   Franklin [name] is quite thin and says that the family often does not have enough food. Dad says that they get some assistance but not enough for the number of people in the home. Franklin [name] has been otherwise generally healthy since he was last seen. Neither he nor his father have other health concerns for him today other than previously mentioned.  Review of systems: Please see HPI for neurologic and other pertinent review of systems. Otherwise all other systems were reviewed and were negative.  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 He was tried on Concerta which caused significant emotional disturbance, then on Ritalin, then Adderall which worked well. He has mild hearing loss. 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   Birth History: He was born at [redacted] weeks gestation, weighing 1 lb 13 1/2 oz. He required ventilator support for sometime. He reportedly had no CNS hemorrhage. Growth and development was delayed.   Surgical history: Past Surgical History:  Procedure Laterality Date   Central line placement     As premature infant   REFRACTIVE SURGERY     For  retinopathy of prematurity     Family history: family history includes Autism in his brother; Diabetes type II in his father; Hypercholesterolemia in his father; Hypertension in his father; Stroke in his paternal aunt and paternal grandmother.   Social history: Social History   Socioeconomic History   Marital status: Single    Spouse name: Not on file    Number of children: Not on file   Years of education: Not on file   Highest education level: Not on file  Occupational History   Not on file  Tobacco Use   Smoking status: Never   Smokeless tobacco: Never  Substance and Sexual Activity   Alcohol use: No   Drug use: No   Sexual activity: Not Currently  Other Topics Concern   Not on file  Social History Narrative   Parents are divorced. Franklin [name] lives with his father.   Social Determinants of Health   Financial Resource Strain: Not on file  Food Insecurity: Not on file  Transportation Needs: Not on file  Physical Activity: Not on file  Stress: Not on file  Social Connections: Not on file  Intimate Partner Violence: Not on file    Past/failed meds:   Allergies: Allergies  Allergen Reactions   Concerta [Methylphenidate] Other (See Comments)    Would start crying for little things like when someone coughed or said a specific word for example.      Immunizations: Immunization History  Administered Date(s) Administered   PFIZER(Purple Top)SARS-COV-2 Vaccination 12/08/2019, 01/01/2020   Tdap 06/30/2015, 09/14/2018     Diagnostics/Screenings: Copied from previous record: EEG in 2006 - generalized convulsive epilepsy  Physical Exam: BP 122/74    Wt 133 lb (60.3 kg)    BMI 19.64 kg/m   Wt Readings from Last 3 Encounters:  10/22/21 133 lb (60.3 kg)  04/13/21 131 lb (59.4 kg)  02/20/21 166 lb 10.7 oz (75.6 kg)  General: Thin but well developed, well nourished young man, seated in exam table, in no evident distress Head: Head normocephalic and atraumatic.  Oropharynx benign. Wears donated glasses that are not his prescription lenses. Neck: Supple Cardiovascular: Regular rate and rhythm, no murmurs Respiratory: Breath sounds clear to auscultation Musculoskeletal: No obvious deformities or scoliosis Skin: No rashes or neurocutaneous lesions  Neurologic Exam Mental Status: Awake and fully alert.  Oriented to place and  time.  Recent and remote memory intact.  Attention span, concentration, and fund of knowledge subnormal for age.  Mood and affect appropriate. Cranial Nerves: Fundoscopic exam reveals sharp disc margins.  Pupils equal, briskly reactive to light.  Extraocular movements full without nystagmus. Hearing intact and symmetric to whisper.  Facial sensation intact.  Face tongue, palate move normally and symmetrically. Shoulder shrug normal Motor: Normal bulk and tone. Normal strength in all tested extremity muscles. Sensory: Intact to touch and temperature in all extremities.  Coordination: Rapid alternating movements normal in all extremities.  Finger-to-nose and heel-to shin performed accurately bilaterally.  Romberg negative. Gait and Station: Arises from chair without difficulty.  Stance is normal. Gait demonstrates normal stride length and balance.   Able to heel, toe and tandem walk without difficulty. Reflexes: 1+ and symmetric. Toes downgoing.    Impression: Generalized convulsive epilepsy (HCC)  Adjustment disorder with mixed anxiety and depressed mood  Poor sleep  Intellectual delay  Does not have health insurance   Recommendations for plan of care: The patient's previous [facility] records were reviewed. Jafet has neither had nor required imaging or lab studies since  the last visit. He is a 26 year old young man with history of epilepsy, anxiety and depression, poor sleep and intellectual delay. He does not have health insurance, which has complicated care for him. Octave is taking and tolerating Levetiracetam that he purchases with cash, Lamictal XR which is provided by [place] patient assistance program and Fycompa which is provided by [employer] patient assistance program. Zyshonne reports that he feels dizzy after taking Fycompa in the evening but that the dizziness resolves by the following morning. I recommended taking the Fycompa immediately before bedtime to see if that helps with the side effect  since he does not have dizziness during the day. I asked him to let me know if the dizziness continues.   We talked about the Medicaid application and I urged Dad to contact Medicaid and the Social Services office about that and about disability for [place]. He is unable to secure gainful employment because of his seizures and intellectual delay. We also talked about food resources for the family.   I will see Kemuel back in follow up in about 4 months. I asked Dad to continue to let me know when seizures occur. I encouraged Render to continue to follow up with Behavioral Health.   The medication list was reviewed and reconciled. No changes were made in the prescribed medications today. A complete medication list was provided to the patient.  Return in about 4 months (around 02/19/2022).   Allergies as of 10/22/2021       Reactions   Concerta [methylphenidate] Other (See Comments)   Would start crying for little things like when someone coughed or said a specific word for example.         Medication List        Accurate as of October 22, 2021 11:59 PM. If you have any questions, ask your nurse or doctor.          STOP taking these medications    feeding supplement Liqd Stopped by: Rockwell Germany, NP   magnesium oxide 400 (240 Mg) MG tablet Commonly known as: MAG-OX Stopped by: Rockwell Germany, NP   meclizine 25 MG tablet Commonly known as: ANTIVERT Stopped by: Rockwell Germany, NP   ondansetron 4 MG disintegrating tablet Commonly known as: Zofran ODT Stopped by: Rockwell Germany, NP   potassium chloride SA 20 MEQ tablet Commonly known as: KLOR-CON M Stopped by: Rockwell Germany, NP   prednisoLONE acetate 1 % ophthalmic suspension Commonly known as: PRED FORTE Stopped by: Rockwell Germany, NP       TAKE these medications    acetaminophen 325 MG tablet Commonly known as: TYLENOL Take 2 tablets (650 mg total) by mouth every 4 (four) hours as needed for mild  pain (or temp >/= 99.5).   aspirin EC 325 MG tablet Take 325 mg by mouth daily.   Fycompa 8 MG tablet Generic drug: perampanel Take 1 tablet at bedtime   LaMICtal XR 100 MG 24 hour tablet Generic drug: lamoTRIgine Take 1 tablet every morning along with Lamictal XR 250mg    LaMICtal XR 200 MG Tb24 24 hour tablet Generic drug: LamoTRIgine Take 1 tablet at night along with a Lamictal XR 300mg  tablet   LaMICtal XR 250 MG Tb24 24 hour tablet Generic drug: LamoTRIgine Take 1 tablet in the morning along with Lamictal XR 100mg    LaMICtal XR 300 MG Tb24 24 hour tablet Generic drug: LamoTRIgine Take 1 tablet at night along with Lamictal XR 200mg    levETIRAcetam 500 MG tablet  Commonly known as: KEPPRA TAKE 3 TABLETS BY MOUTH IN THE MORNING AND TAKE 3 TABLETS BY MOUTH IN THE EVENING      Total time spent with the patient was 20 minutes, of which 50% or more was spent in counseling and coordination of care.  Rockwell Germany NP-C [facility] Ph. [phone] Fax [phone]

## 2021-10-24 ENCOUNTER — Encounter (INDEPENDENT_AMBULATORY_CARE_PROVIDER_SITE_OTHER): Payer: Self-pay | Admitting: Family

## 2021-10-24 DIAGNOSIS — Z5989 Other problems related to housing and economic circumstances: Secondary | ICD-10-CM | POA: Insufficient documentation

## 2021-10-24 MED ORDER — LEVETIRACETAM 500 MG PO TABS: ORAL_TABLET | ORAL | 5 refills | Status: DC

## 2021-10-24 NOTE — Patient Instructions (Signed)
It was a pleasure to see you today!  Instructions for you until your next appointment are as follows: Try taking Fycompa immediately before going to bed to see if that helps with the dizziness. If it continues to be a problem, let me know.  Continue taking your other medications as prescribed.  Please let me know when you have seizures Continue following up with Behavioral Health for your depression and anxiety Be sure to contact Medicaid and Social Services about the Medicaid application and how to apply for disability Please sign up for MyChart if you have not done so. Please plan to return for follow up in 4 months or sooner if needed.    Feel free to contact our office during normal business hours at [phone] with questions or concerns. If there is no answer or the call is outside business hours, please leave a message and our clinic staff will call you back within the next business day.  If you have an urgent concern, please stay on the line for our after-hours answering service and ask for the on-call neurologist.     I also encourage you to use MyChart to communicate with me more directly. If you have not yet signed up for MyChart within [facility], the front desk staff can help you. However, please note that this inbox is NOT monitored on nights or weekends, and response can take up to 2 business days.  Urgent matters should be discussed with the on-call pediatric neurologist.   At [facility], we are committed to providing exceptional care. You will receive a patient satisfaction survey through text or email regarding your visit today. Your opinion is important to me. Comments are appreciated.

## 2021-11-13 ENCOUNTER — Other Ambulatory Visit (INDEPENDENT_AMBULATORY_CARE_PROVIDER_SITE_OTHER): Payer: Self-pay | Admitting: Family

## 2021-11-13 DIAGNOSIS — G40309 Generalized idiopathic epilepsy and epileptic syndromes, not intractable, without status epilepticus: Secondary | ICD-10-CM

## 2021-11-17 ENCOUNTER — Telehealth (INDEPENDENT_AMBULATORY_CARE_PROVIDER_SITE_OTHER): Payer: Self-pay | Admitting: Family

## 2021-11-17 DIAGNOSIS — G40309 Generalized idiopathic epilepsy and epileptic syndromes, not intractable, without status epilepticus: Secondary | ICD-10-CM

## 2021-11-17 MED ORDER — LEVETIRACETAM 500 MG PO TABS: ORAL_TABLET | ORAL | 5 refills | Status: DC

## 2021-11-17 NOTE — Telephone Encounter (Signed)
?  Who's calling (name and relationship to patient) :Pharmacy  ? ?Best contact number:[phone]  ? ?Provider they XNA:TFTD Goodpasure  ? ?Reason for call:pharmacy called requesting a refill for the KEPPRA  ? ? ? ? ?PRESCRIPTION REFILL ONLY ? ?Name of prescription: ? ?Pharmacy: ? ? ?

## 2021-12-09 ENCOUNTER — Other Ambulatory Visit (INDEPENDENT_AMBULATORY_CARE_PROVIDER_SITE_OTHER): Payer: Self-pay | Admitting: Family

## 2021-12-09 DIAGNOSIS — G40309 Generalized idiopathic epilepsy and epileptic syndromes, not intractable, without status epilepticus: Secondary | ICD-10-CM

## 2021-12-09 MED ORDER — FYCOMPA 8 MG PO TABS: ORAL_TABLET | ORAL | 5 refills | Status: DC

## 2021-12-09 NOTE — Telephone Encounter (Signed)
I sent in the Rx for the [facility] Patient Assistance program and called Dad to let him know. TG ?

## 2021-12-09 NOTE — Telephone Encounter (Signed)
?  Name of who is calling:Harvey  ? ?Caller's Relationship to Patient:Father  ? ?Best contact [phone] ? ?Provider they JIR:CVEL Goodpasture  ? ?Reason for call:MEDICATION REFILL.caller requested a call back when medication was called in. ? ? ? ? ?PRESCRIPTION REFILL ONLY ? ?Name of prescription:FYCOMPA  ? ?Pharmacy: ? ? ?

## 2021-12-10 ENCOUNTER — Other Ambulatory Visit (INDEPENDENT_AMBULATORY_CARE_PROVIDER_SITE_OTHER): Payer: Self-pay | Admitting: Family

## 2021-12-10 DIAGNOSIS — G40309 Generalized idiopathic epilepsy and epileptic syndromes, not intractable, without status epilepticus: Secondary | ICD-10-CM

## 2021-12-10 MED ORDER — FYCOMPA 8 MG PO TABS: ORAL_TABLET | ORAL | 3 refills | Status: DC

## 2021-12-10 MED ORDER — FYCOMPA 8 MG PO TABS: ORAL_TABLET | ORAL | 5 refills | Status: DC

## 2021-12-15 ENCOUNTER — Telehealth (INDEPENDENT_AMBULATORY_CARE_PROVIDER_SITE_OTHER): Payer: Self-pay | Admitting: Family

## 2021-12-17 ENCOUNTER — Telehealth (INDEPENDENT_AMBULATORY_CARE_PROVIDER_SITE_OTHER): Payer: Self-pay | Admitting: Family

## 2021-12-17 NOTE — Telephone Encounter (Signed)
?  Name of who is calling: ? ?Caller's Relationship to Patient: ? ?Best contact number: ?[phone] ?Provider they see: ?Inetta Fermo ?Reason for call: ?A page is missing from the medical assistance form for patient please contact to advise ? ? ? ?PRESCRIPTION REFILL ONLY ? ?Name of prescription: ? ?Pharmacy: ? ? ?

## 2021-12-17 NOTE — Telephone Encounter (Signed)
I called Dad & asked him to bring Franklin [name] in to sign the form that is needed. Dad agreed to do so. TG ?

## 2021-12-19 ENCOUNTER — Telehealth (INDEPENDENT_AMBULATORY_CARE_PROVIDER_SITE_OTHER): Payer: Self-pay | Admitting: Family

## 2021-12-19 NOTE — Telephone Encounter (Signed)
I tried to fax the document to them today but the fax would not go through. TG ?

## 2021-12-19 NOTE — Telephone Encounter (Signed)
?  Name of who is calling: ? ?Caller's Relationship to Patient: ? ?Best contact number: ? ?Provider they see: Rudean [place]  ? ?Reason for call: ?Page is missing from medical assistance form  ? ? ? ?PRESCRIPTION REFILL ONLY ? ?Name of prescription: ? ?Pharmacy: ? ? ?

## 2021-12-22 NOTE — Telephone Encounter (Signed)
I was able to fax the document today. TG ?

## 2022-01-18 ENCOUNTER — Telehealth (INDEPENDENT_AMBULATORY_CARE_PROVIDER_SITE_OTHER): Payer: Self-pay | Admitting: Pediatrics

## 2022-01-18 ENCOUNTER — Emergency Department (HOSPITAL_COMMUNITY)
Admission: EM | Admit: 2022-01-18 | Discharge: 2022-01-18 | Disposition: A | Discharge status: Home / Self Care | Payer: Medicaid Other | Attending: Emergency Medicine | Admitting: Emergency Medicine

## 2022-01-18 ENCOUNTER — Other Ambulatory Visit: Payer: Self-pay

## 2022-01-18 DIAGNOSIS — Z7982 Long term (current) use of aspirin: Secondary | ICD-10-CM | POA: Insufficient documentation

## 2022-01-18 DIAGNOSIS — R519 Headache, unspecified: Secondary | ICD-10-CM | POA: Insufficient documentation

## 2022-01-18 DIAGNOSIS — R569 Unspecified convulsions: Secondary | ICD-10-CM | POA: Insufficient documentation

## 2022-01-18 LAB — CBC WITH DIFFERENTIAL/PLATELET
Abs Immature Granulocytes: 0.02 10*3/uL (ref 0.00–0.07)
Basophils Absolute: 0.1 10*3/uL (ref 0.0–0.1)
Basophils Relative: 1 %
Eosinophils Absolute: 0.2 10*3/uL (ref 0.0–0.5)
Eosinophils Relative: 3 %
HCT: 44.1 % (ref 39.0–52.0)
Hemoglobin: 14.8 g/dL (ref 13.0–17.0)
Immature Granulocytes: 0 %
Lymphocytes Relative: 28 %
Lymphs Abs: 2.1 10*3/uL (ref 0.7–4.0)
MCH: 31 pg (ref 26.0–34.0)
MCHC: 33.6 g/dL (ref 30.0–36.0)
MCV: 92.5 fL (ref 80.0–100.0)
Monocytes Absolute: 0.8 10*3/uL (ref 0.1–1.0)
Monocytes Relative: 10 %
Neutro Abs: 4.3 10*3/uL (ref 1.7–7.7)
Neutrophils Relative %: 58 %
Platelets: 401 10*3/uL — ABNORMAL HIGH (ref 150–400)
RBC: 4.77 MIL/uL (ref 4.22–5.81)
RDW: 12.8 % (ref 11.5–15.5)
WBC: 7.4 10*3/uL (ref 4.0–10.5)
nRBC: 0 % (ref 0.0–0.2)

## 2022-01-18 LAB — COMPREHENSIVE METABOLIC PANEL
ALT: 31 U/L (ref 0–44)
AST: 34 U/L (ref 15–41)
Albumin: 4.3 g/dL (ref 3.5–5.0)
Alkaline Phosphatase: 203 U/L — ABNORMAL HIGH (ref 38–126)
Anion gap: 7 (ref 5–15)
BUN: 11 mg/dL (ref 6–20)
CO2: 25 mmol/L (ref 22–32)
Calcium: 8.4 mg/dL — ABNORMAL LOW (ref 8.9–10.3)
Chloride: 103 mmol/L (ref 98–111)
Creatinine, Ser: 0.83 mg/dL (ref 0.61–1.24)
GFR, Estimated: >60 mL/min (ref >60)
Glucose, Bld: 86 mg/dL (ref 70–99)
Potassium: 3.5 mmol/L (ref 3.5–5.1)
Sodium: 135 mmol/L (ref 135–145)
Total Bilirubin: 0.9 mg/dL (ref 0.3–1.2)
Total Protein: 7.3 g/dL (ref 6.5–8.1)

## 2022-01-18 LAB — URINALYSIS, ROUTINE W REFLEX MICROSCOPIC
Bilirubin Urine: NEGATIVE
Glucose, UA: NEGATIVE mg/dL
Hgb urine dipstick: NEGATIVE
Ketones, ur: 20 mg/dL — AB
Leukocytes,Ua: NEGATIVE
Nitrite: NEGATIVE
Protein, ur: NEGATIVE mg/dL
Specific Gravity, Urine: 1.02 (ref 1.005–1.030)
pH: 5 (ref 5.0–8.0)

## 2022-01-18 LAB — CBG MONITORING, ED: Glucose-Capillary: 85 mg/dL (ref 70–99)

## 2022-01-18 LAB — MAGNESIUM: Magnesium: 2.4 mg/dL (ref 1.7–2.4)

## 2022-01-18 MED ORDER — SODIUM CHLORIDE 0.9 % IV BOLUS
1000.0000 mL | Freq: Once | INTRAVENOUS | Status: AC
  Administered 2022-01-18: 1000 mL via INTRAVENOUS

## 2022-01-18 NOTE — Telephone Encounter (Signed)
Paged by Marin Olp regarding Franklin [name]. Father reports he has had 3 seizures today that have been fairly close together. One before lunch time and two after lunch time. He describes the seizures as grand mal with accompanying jerking, eyes going right to left. He reports seizures lasting 20 seconds to 1 minute and shortening in duration as they occur. He experiences post-ictal phase after seizures where he sleeps but has not completely returned to baseline since seizure began to occur. Last known seizure father estimates occurred in January 2023. He does not have rescue medication. He is managed on Lamictal, keppra, and fycompa with no missing doses per father. Father does endorse Giovan may be dehydrated but otherwise has been in his normal state of health before episodes occurred. Recommended continuing to monitor at home and possibly increasing medication in conjunction with primary provider Inetta Fermo tomorrow morning. If seizures persist throughout the day, encouraged to bring to ED for further evaluation and management. Father in agreement with plan.  ?

## 2022-01-18 NOTE — ED Triage Notes (Signed)
Pt to ED via EMS from home c/o seizures. Pt has hx epilepsy- Pt has had 4 witnessed seizures today by family, No trauma associated with seizure. Pt c/o HA, a&o x 4. Pt takes Lamictal and keppra for seizures- complaint with medication. No recent med adjustments. #20Lforearm. No medications given by EMS.  ?Last VS: 102/76, HR 84, CBG 88, 97%RA.  ?

## 2022-01-18 NOTE — ED Provider Notes (Signed)
[facility]-EMERGENCY DEPT Provider Note   CSN: [id number] Arrival date & time: 01/18/22  1941     History  Chief Complaint  Patient presents with   Seizures    Franklin [name] is a 26 y.o. male.  Pt is a 26 yo male with a pmhx significant for epilepsy, prematurity, intellectual disability, anxiety, and sleeping problems.  Pt is followed in the [place] [facility] clinic with Elveria Rising, NP.  Pt said he's been compliant with his meds (fycompa, lamictal, and keppra).  He did not sleep last night.  Today, pt had 4 seizures witnessed by family.  He did not injure himself with his seizure.  Pt denies any f/c.  He had a headache when EMS arrived, but said it is better now.              Home Medications Prior to Admission medications   Medication Sig Start Date End Date Taking? Authorizing Provider  acetaminophen (TYLENOL) 325 MG tablet Take 2 tablets (650 mg total) by mouth every 4 (four) hours as needed for mild pain (or temp >/= 99.5). Patient not taking: Reported on 06/15/2019 05/14/19   Albertine Grates, MD  aspirin EC 325 MG tablet Take 325 mg by mouth daily.    [provider]  FYCOMPA 8 MG tablet Take 1 tablet at bedtime 12/10/21   Elveria Rising, NP  LAMICTAL XR 100 MG 24 hour tablet Take 1 tablet every morning along with Lamictal XR 250mg  08/11/21   14/5/22, NP  LAMICTAL XR 200 MG TB24 24 hour tablet Take 1 tablet at night along with a Lamictal XR 300mg  tablet 08/11/21   , NP  LAMICTAL XR 250 MG TB24 24 hour tablet Take 1 tablet in the morning along with Lamictal XR 100mg  08/11/21   Elveria Rising, NP  LAMICTAL XR 300 MG TB24 24 hour tablet Take 1 tablet at night along with Lamictal XR 200mg  08/11/21   14/5/22, NP  levETIRAcetam (KEPPRA) 500 MG tablet TAKE 3 TABLETS BY MOUTH IN THE MORNING AND TAKE 3 TABLETS BY MOUTH IN THE EVENING 11/17/21   , NP      Allergies    Concerta  [methylphenidate]    Review of Systems   Review of Systems  Neurological:  Positive for seizures.  All other systems reviewed and are negative.  Physical Exam Updated Vital Signs BP 102/63   Pulse 69   Temp 98 F (36.7 C) (Oral)   Resp 10   Ht 5\' 9"  (1.753 m)   Wt 59 kg   SpO2 97%   BMI 19.20 kg/m  Physical Exam Vitals and nursing note reviewed.  Constitutional:      Appearance: Normal appearance.  HENT:     Head: Normocephalic and atraumatic.     Right Ear: External ear normal.     Left Ear: External ear normal.     Nose: Nose normal.     Mouth/Throat:     Mouth: Mucous membranes are moist.     Pharynx: Oropharynx is clear.  Eyes:     Extraocular Movements: Extraocular movements intact.     Conjunctiva/sclera: Conjunctivae normal.     Pupils: Pupils are equal, round, and reactive to light.  Cardiovascular:     Rate and Rhythm: Normal rate and regular rhythm.     Pulses: Normal pulses.     Heart sounds: Normal heart sounds.  Pulmonary:     Effort: Pulmonary effort is normal.  Breath sounds: Normal breath sounds.  Abdominal:     General: Abdomen is flat. Bowel sounds are normal.     Palpations: Abdomen is soft.  Musculoskeletal:        General: Normal range of motion.     Cervical back: Normal range of motion and neck supple.  Skin:    General: Skin is warm.     Capillary Refill: Capillary refill takes less than 2 seconds.  Neurological:     General: No focal deficit present.     Mental Status: He is alert and oriented to person, place, and time.  Psychiatric:        Mood and Affect: Mood normal.        Behavior: Behavior normal.    ED Results / Procedures / Treatments   Labs (all labs ordered are listed, but only abnormal results are displayed) Labs Reviewed  COMPREHENSIVE METABOLIC PANEL - Abnormal; Notable for the following components:      Result Value   Calcium 8.4 (*)    Alkaline Phosphatase 203 (*)    All other components within normal  limits  CBC WITH DIFFERENTIAL/PLATELET - Abnormal; Notable for the following components:   Platelets 401 (*)    All other components within normal limits  MAGNESIUM  URINALYSIS, ROUTINE W REFLEX MICROSCOPIC  CBG MONITORING, ED    EKG EKG Interpretation  Date/Time:  Sunday Jan 18 2022 19:57:04 EDT Ventricular Rate:  70 PR Interval:  163 QRS Duration: 87 QT Interval:  400 QTC Calculation: 432 R Axis:   83 Text Interpretation: Sinus rhythm LVH by voltage Artifact in lead(s) I II III aVR aVL No significant change since last tracing Confirmed by Jacalyn Lefevre [phone]) on 01/18/2022 8:01:52 PM  Radiology No results found.  Procedures Procedures    Medications Ordered in ED Medications - No data to display  ED Course/ Medical Decision Making/ A&P                           Medical Decision Making Amount and/or Complexity of Data Reviewed Labs: ordered.   This patient presents to the ED for concern of seizure, this involves an extensive number of treatment options, and is a complaint that carries with it a high risk of complications and morbidity.  The differential diagnosis includes seizure d/o, infection, electrolyte abn   Co morbidities that complicate the patient evaluation  epilepsy, prematurity, intellectual disability, anxiety, and sleeping problems   Additional history obtained:  Additional history obtained from epic chart review External records from outside source obtained and reviewed including EMS report and dad   Lab Tests:  I Ordered, and personally interpreted labs.  The pertinent results include:  cbc nl, cmp nl, mg nl    Cardiac Monitoring:  The patient was maintained on a cardiac monitor.  I personally viewed and interpreted the cardiac monitored which showed an underlying rhythm of: nsr   Medicines ordered and prescription drug management:  I ordered medication including IVFs  for dehydration  Reevaluation of the patient after these  medicines showed that the patient improved I have reviewed the patients home medicines and have made adjustments as needed   Test Considered:  CT head, but hx of seizures and no head trauma   Critical Interventions:  Neuro contult   Consultations Obtained:  I requested consultation with the peds neurologist (Dr. Artis Flock),  and discussed lab and imaging findings as well as pertinent plan - she recommended no  med changes tonight.  She will message Elveria Risingina Goodpasture so that she can call the pt tomorrow to discuss the meds.   Problem List / ED Course:  Seizure:  likely from lack of sleep   Reevaluation:  After the interventions noted above, I reevaluated the patient and found that they have :improved   Social Determinants of Health:  Lives at home with family   Dispostion:  After consideration of the diagnostic results and the patients response to treatment, I feel that the patent would benefit from discharge with outpatient f/u.          Final Clinical Impression(s) / ED Diagnoses Final diagnoses:  Seizure [facility](HCC)    Rx / DC Orders ED Discharge Orders     None         Jacalyn LefevreHaviland, Vi Whitesel, MD 01/22/22 1516

## 2022-01-18 NOTE — ED Notes (Signed)
Pt d/c home with father per MD order. Discharge summary reviewed, verbalize understanding. Ambulatory. No s/s of acute distress noted at discharge.  ?

## 2022-01-19 NOTE — Telephone Encounter (Signed)
[facility] [employer] ID: [id number]

## 2022-01-22 ENCOUNTER — Telehealth (INDEPENDENT_AMBULATORY_CARE_PROVIDER_SITE_OTHER): Payer: Self-pay | Admitting: Family

## 2022-01-22 NOTE — Telephone Encounter (Signed)
  Name of who is calling: Mendel Ryder Caller's Relationship to Patient: dad Best contact number: [phone]  Provider they see: [facility]  Reason for call: Dad has called in requesting for Inetta Fermo to give him a call back.     PRESCRIPTION REFILL ONLY  Name of prescription:  Pharmacy:

## 2022-01-22 NOTE — Telephone Encounter (Signed)
I called and talked with Dad. He said that Franklin [name] was out of Fycompa and that he had received a letter stating that the new application for Fycompa was denied. I told Dad that I had talked with the patient assistance program and they had asked me for a letter confirming that [place] has no income. I will write the letter and fax it today. Dad agreed with this plan. TG

## 2022-01-26 ENCOUNTER — Telehealth (INDEPENDENT_AMBULATORY_CARE_PROVIDER_SITE_OTHER): Payer: Self-pay

## 2022-01-26 NOTE — Telephone Encounter (Signed)
  Name of who is calling: Franklin [name] Relationship to Patient: Father  Best contact number:  Provider they see: Otila Kluver  Reason for call: Father called to let Otila Kluver know that Lean had 2 seizures last night. That he has missed doses of his medications due to being out of them. Father is picking them up the second one (Keppra) today. The fycompa is coming in the mail.     PRESCRIPTION REFILL ONLY  Name of prescription:  Pharmacy:

## 2022-01-26 NOTE — Telephone Encounter (Signed)
Let Franklin [name] know patient was out of medications, and unfortunately had a few seizures. Franklin [name] asked that we confirm that they were picking up the Keppra today, and see if they knew when the Fycompa would be in the mail.   Spoke with [name] and he states he is getting ready to go pick up the Keppra, and the Fycompa should be arriving by mail if not tomorrow then the day after. Informed [name] if they have not received it by Wednesday to give [place] a call and let us know. [name] states understanding and ended the call.

## 2022-02-04 NOTE — Telephone Encounter (Signed)
Error

## 2022-02-18 ENCOUNTER — Emergency Department (HOSPITAL_COMMUNITY): Payer: Self-pay

## 2022-02-18 ENCOUNTER — Encounter (HOSPITAL_COMMUNITY): Payer: Self-pay

## 2022-02-18 ENCOUNTER — Emergency Department (HOSPITAL_COMMUNITY)
Admission: EM | Admit: 2022-02-18 | Discharge: 2022-02-18 | Disposition: A | Discharge status: Home / Self Care | Payer: Self-pay | Attending: Emergency Medicine | Admitting: Emergency Medicine

## 2022-02-18 ENCOUNTER — Other Ambulatory Visit: Payer: Self-pay

## 2022-02-18 DIAGNOSIS — S80812A Abrasion, left lower leg, initial encounter: Secondary | ICD-10-CM | POA: Insufficient documentation

## 2022-02-18 DIAGNOSIS — W19XXXA Unspecified fall, initial encounter: Secondary | ICD-10-CM | POA: Insufficient documentation

## 2022-02-18 DIAGNOSIS — Z7982 Long term (current) use of aspirin: Secondary | ICD-10-CM | POA: Insufficient documentation

## 2022-02-18 DIAGNOSIS — S0083XA Contusion of other part of head, initial encounter: Secondary | ICD-10-CM | POA: Insufficient documentation

## 2022-02-18 DIAGNOSIS — S8001XA Contusion of right knee, initial encounter: Secondary | ICD-10-CM | POA: Insufficient documentation

## 2022-02-18 DIAGNOSIS — S80811A Abrasion, right lower leg, initial encounter: Secondary | ICD-10-CM | POA: Insufficient documentation

## 2022-02-18 DIAGNOSIS — T07XXXA Unspecified multiple injuries, initial encounter: Secondary | ICD-10-CM

## 2022-02-18 DIAGNOSIS — Y92480 Sidewalk as the place of occurrence of the external cause: Secondary | ICD-10-CM | POA: Insufficient documentation

## 2022-02-18 DIAGNOSIS — Z23 Encounter for immunization: Secondary | ICD-10-CM | POA: Insufficient documentation

## 2022-02-18 DIAGNOSIS — G40909 Epilepsy, unspecified, not intractable, without status epilepticus: Secondary | ICD-10-CM | POA: Insufficient documentation

## 2022-02-18 DIAGNOSIS — R569 Unspecified convulsions: Secondary | ICD-10-CM

## 2022-02-18 LAB — I-STAT CHEM 8, ED
BUN: 9 mg/dL (ref 6–20)
Calcium, Ion: 1.11 mmol/L — ABNORMAL LOW (ref 1.15–1.40)
Chloride: 99 mmol/L (ref 98–111)
Creatinine, Ser: 0.8 mg/dL (ref 0.61–1.24)
Glucose, Bld: 97 mg/dL (ref 70–99)
HCT: 43 % (ref 39.0–52.0)
Hemoglobin: 14.6 g/dL (ref 13.0–17.0)
Potassium: 3.9 mmol/L (ref 3.5–5.1)
Sodium: 139 mmol/L (ref 135–145)
TCO2: 29 mmol/L (ref 22–32)

## 2022-02-18 MED ORDER — LEVETIRACETAM IN NACL 1000 MG/100ML IV SOLN
1000.0000 mg | Freq: Once | INTRAVENOUS | Status: AC
  Administered 2022-02-18: 1000 mg via INTRAVENOUS
  Filled 2022-02-18: qty 100

## 2022-02-18 MED ORDER — TETANUS-DIPHTH-ACELL PERTUSSIS 5-2.5-18.5 LF-MCG/0.5 IM SUSY
0.5000 mL | PREFILLED_SYRINGE | Freq: Once | INTRAMUSCULAR | Status: AC
  Administered 2022-02-18: 0.5 mL via INTRAMUSCULAR
  Filled 2022-02-18: qty 0.5

## 2022-02-18 NOTE — Discharge Instructions (Signed)
Keep the wounds clean and dry.  Follow-up with your neurologist and make sure they are aware that you had another seizure.  Take your medications as directed.  Return to the emergency room if you have any worsening symptoms.

## 2022-02-18 NOTE — ED Provider Notes (Signed)
[facility]-EMERGENCY DEPT Provider Note   CSN: [id number] Arrival date & time: 02/18/22  1554     History  Chief Complaint  Patient presents with   Seizures    VERDIE BARROWS is a 26 y.o. male.  Patient is a 26 year old male who has a history of seizures and intellectual disability.  He was found by bystander on the sidewalk after he reportedly had a seizure.  On EMS arrival, he was responsive on the ground.  He has a history of seizure disorder.  His last seizure was in mid April.  At 1 point he had been out of his medications but he says he has since restarted them.  He says he has not missed any recent doses.  He is on Keppra, Lamictal, and Fycompa.  He said that lack of sleep usually triggers his seizures but he has been sleeping okay.  He has some facial trauma from the seizure today.  He also says he has some abrasions on his legs.  He denies any other injuries.       Home Medications Prior to Admission medications   Medication Sig Start Date End Date Taking? Authorizing Provider  acetaminophen (TYLENOL) 325 MG tablet Take 2 tablets (650 mg total) by mouth every 4 (four) hours as needed for mild pain (or temp >/= 99.5). Patient not taking: Reported on 06/15/2019 05/14/19   Albertine Grates, MD  aspirin EC 325 MG tablet Take 325 mg by mouth daily.    [provider]  FYCOMPA 8 MG tablet Take 1 tablet at bedtime 12/10/21   Elveria Rising, NP  LAMICTAL XR 100 MG 24 hour tablet Take 1 tablet every morning along with Lamictal XR  08/11/21   Elveria Rising, NP  LAMICTAL XR 200 MG TB24 24 hour tablet Take 1 tablet at night along with a Lamictal XR  tablet 08/11/21   Elveria Rising, NP  LAMICTAL XR 250 MG TB24 24 hour tablet Take 1 tablet in the morning along with Lamictal XR  08/11/21   Elveria Rising, NP  LAMICTAL XR 300 MG TB24 24 hour tablet Take 1 tablet at night along with Lamictal XR  08/11/21   Elveria Rising, NP  levETIRAcetam  (KEPPRA) 500 MG tablet TAKE 3 TABLETS BY MOUTH IN THE MORNING AND TAKE 3 TABLETS BY MOUTH IN THE EVENING 11/17/21   Elveria Rising, NP      Allergies    Concerta [methylphenidate]    Review of Systems   Review of Systems  Constitutional:  Negative for chills, diaphoresis, fatigue and fever.  HENT:  Positive for facial swelling. Negative for congestion, rhinorrhea and sneezing.   Eyes: Negative.   Respiratory:  Negative for cough, chest tightness and shortness of breath.   Cardiovascular:  Negative for chest pain and leg swelling.  Gastrointestinal:  Negative for abdominal pain, blood in stool, diarrhea, nausea and vomiting.  Genitourinary:  Negative for difficulty urinating, flank pain, frequency and hematuria.  Musculoskeletal:  Negative for arthralgias and back pain.  Skin:  Positive for wound. Negative for rash.  Neurological:  Positive for headaches. Negative for dizziness, speech difficulty, weakness and numbness.    Physical Exam Updated Vital Signs BP 108/74   Pulse 74   Temp 98.1 F (36.7 C) (Oral)   Resp 13   SpO2 97%  Physical Exam Constitutional:      Appearance: He is well-developed.  HENT:     Head: Normocephalic and atraumatic.     Comments: Abrasions to  the right side of the face.  There is tenderness over the right maxilla.  No deformity.  No malocclusion.  No acute dental injury is noted. Eyes:     Pupils: Pupils are equal, round, and reactive to light.  Neck:     Comments: No pain along the cervical, thoracic or lumbosacral spine Cardiovascular:     Rate and Rhythm: Normal rate and regular rhythm.     Heart sounds: Normal heart sounds.  Pulmonary:     Effort: Pulmonary effort is normal. No respiratory distress.     Breath sounds: Normal breath sounds. No wheezing or rales.  Chest:     Chest wall: No tenderness.  Abdominal:     General: Bowel sounds are normal.     Palpations: Abdomen is soft.     Tenderness: There is no abdominal tenderness. There  is no guarding or rebound.  Musculoskeletal:        General: Normal range of motion.     Cervical back: Normal range of motion and neck supple.  Lymphadenopathy:     Cervical: No cervical adenopathy.  Skin:    General: Skin is warm and dry.     Findings: No rash.  Neurological:     General: No focal deficit present.     Mental Status: He is alert and oriented to person, place, and time.     Comments: Motor 5/5 all extremities Sensation grossly intact to LT all extremities Finger to Nose intact, no pronator drift CN II-XII grossly intact       ED Results / Procedures / Treatments   Labs (all labs ordered are listed, but only abnormal results are displayed) Labs Reviewed  I-STAT CHEM 8, ED - Abnormal; Notable for the following components:      Result Value   Calcium, Ion 1.11 (*)    All other components within normal limits    EKG None  Radiology DG Knee Complete 4 Views Right  Result Date: 02/18/2022 CLINICAL DATA:  Trauma, fall, pain EXAM: RIGHT KNEE - COMPLETE 4+ VIEW COMPARISON:  05/12/2019 FINDINGS: No recent fracture or dislocation is seen. Possible small effusion is present in the suprapatellar bursa. IMPRESSION: No fracture or dislocation is seen in the right knee. Possible small effusion. Electronically Signed   By: Ernie Avena M.D.   On: 02/18/2022 19:19   CT Head Wo Contrast  Result Date: 02/18/2022 CLINICAL DATA:  Seizure, found down, abrasion to right face EXAM: CT HEAD WITHOUT CONTRAST CT MAXILLOFACIAL WITHOUT CONTRAST TECHNIQUE: Multidetector CT imaging of the head and maxillofacial structures were performed using the standard protocol without intravenous contrast. Multiplanar CT image reconstructions of the maxillofacial structures were also generated. RADIATION DOSE REDUCTION: This exam was performed according to the departmental dose-optimization program which includes automated exposure control, adjustment of the mA and/or kV according to patient size  and/or use of iterative reconstruction technique. COMPARISON:  08/05/2020 FINDINGS: CT HEAD FINDINGS Brain: No evidence of acute infarction, hemorrhage, hydrocephalus, extra-axial collection or mass lesion/mass effect. Asymmetric prominence of the left lateral ventricle, this configuration unchanged compared to prior examinations (series 3, image 16). Vascular: No hyperdense vessel or unexpected calcification. Skull: Normal. Negative for fracture or focal lesion. Other: None. CT MAXILLOFACIAL FINDINGS Osseous: No fracture or mandibular dislocation. No destructive process. Orbits: Negative. No traumatic or inflammatory finding. Sinuses: Clear. Soft tissues: Soft tissue contusion of the right cheek (series 3, image 36). IMPRESSION: 1. No acute intracranial pathology. 2. No displaced fracture or dislocation of the facial  bones. 3. Soft tissue contusion of the right cheek. Electronically Signed   By: Jearld Lesch M.D.   On: 02/18/2022 17:50   CT Maxillofacial WO CM  Result Date: 02/18/2022 CLINICAL DATA:  Seizure, found down, abrasion to right face EXAM: CT HEAD WITHOUT CONTRAST CT MAXILLOFACIAL WITHOUT CONTRAST TECHNIQUE: Multidetector CT imaging of the head and maxillofacial structures were performed using the standard protocol without intravenous contrast. Multiplanar CT image reconstructions of the maxillofacial structures were also generated. RADIATION DOSE REDUCTION: This exam was performed according to the departmental dose-optimization program which includes automated exposure control, adjustment of the mA and/or kV according to patient size and/or use of iterative reconstruction technique. COMPARISON:  08/05/2020 FINDINGS: CT HEAD FINDINGS Brain: No evidence of acute infarction, hemorrhage, hydrocephalus, extra-axial collection or mass lesion/mass effect. Asymmetric prominence of the left lateral ventricle, this configuration unchanged compared to prior examinations (series 3, image 16). Vascular: No  hyperdense vessel or unexpected calcification. Skull: Normal. Negative for fracture or focal lesion. Other: None. CT MAXILLOFACIAL FINDINGS Osseous: No fracture or mandibular dislocation. No destructive process. Orbits: Negative. No traumatic or inflammatory finding. Sinuses: Clear. Soft tissues: Soft tissue contusion of the right cheek (series 3, image 36). IMPRESSION: 1. No acute intracranial pathology. 2. No displaced fracture or dislocation of the facial bones. 3. Soft tissue contusion of the right cheek. Electronically Signed   By: Jearld Lesch M.D.   On: 02/18/2022 17:50    Procedures Procedures    Medications Ordered in ED Medications  levETIRAcetam (KEPPRA) IVPB 1000 mg/100 mL premix (0 mg Intravenous Stopped 02/18/22 1733)  Tdap (BOOSTRIX) injection 0.5 mL (0.5 mLs Intramuscular Given 02/18/22 1809)    ED Course/ Medical Decision Making/ A&P                           Medical Decision Making Problems Addressed: Seizure [facility]): acute illness or injury that poses a threat to life or bodily functions  Amount and/or Complexity of Data Reviewed Independent Historian: EMS External Data Reviewed: notes. Labs: ordered. Decision-making details documented in ED Course. Radiology: ordered and independent interpretation performed. Decision-making details documented in ED Course.  Risk Drug therapy requiring intensive monitoring for toxicity. Decision regarding hospitalization.   Patient is a 26 year old male who presents after having a presumed seizure.  He has some abrasions on his face with some underlying tenderness as well as some abrasions on his knees.  He had a CT scan of his head and maxillofacial bones which showed no fractures.  No intracranial hemorrhage or other acute abnormality.  He had no spinal tenderness on exam.  He is neurologically intact.  His labs are nonconcerning.  He has not had any further seizure activities.  He was loaded with a dose of IV Keppra.  He has some  abrasions on his lower extremities.  X-rays of his right knee where he had some tenderness showed no fracture.  This was interpreted by me and confirmed by the radiologist.  There is minimal swelling to the area.  Given these had no further seizure activity, I I do not feel that he needs inpatient hospitalization.  He was discharged home in good condition.  His tetanus shot was updated.  He was encouraged to take his medications as directed.  He was encouraged to follow-up with his neurologist.  Return precautions were given.  Final Clinical Impression(s) / ED Diagnoses Final diagnoses:  Seizure (HCC)  Contusion of face, initial encounter  Contusion of  right knee, initial encounter  Abrasions of multiple sites    Rx / DC Orders ED Discharge Orders     None         Rolan BuccoBelfi, Shereece Wellborn, MD 02/18/22 2055

## 2022-02-18 NOTE — ED Triage Notes (Addendum)
Pt BIB EMS from sidewalk. Pt has hx of seizures. Pt unaware if he had a seizure, EMS states pt was lying on ground upon arrival of EMS, responsive. Pt states he has been compliant with his seizure meds.  Pt has abrasion to right cheek, pt not able to confirm if he fell, or if he hit his head.

## 2022-02-18 NOTE — ED Notes (Signed)
I provided reinforced discharge education based off of discharge instructions. Pt and guardian/father Franklin [name] acknowledged and understood my education. Pt and pt father/guardian had no further questions/concerns for provider/myself.

## 2022-02-19 ENCOUNTER — Telehealth (INDEPENDENT_AMBULATORY_CARE_PROVIDER_SITE_OTHER): Payer: Self-pay | Admitting: Family

## 2022-02-19 NOTE — Telephone Encounter (Signed)
Call to Mr. Choinski- advised to all the [place] Medicaid Ombudsman at [phone] RN just spoke with someone there and she said they could help determine why he is not qualifying for Medicaid. RN did not realize he also has not qualified for disability but told him to ask them about that as well. He agrees and will contact them.

## 2022-02-19 NOTE — Telephone Encounter (Signed)
  Name of who is calling: [facility] Relationship to Patient: Father (DPR on file)  Best contact number: [phone]  Provider they see: Elveria Rising  Reason for call: Father reported that patient had a seizure yesterday and fell. Father would like to discuss this with Inetta Fermo.      PRESCRIPTION REFILL ONLY  Name of prescription:  Pharmacy:

## 2022-02-19 NOTE — Telephone Encounter (Signed)
I called and spoke with both patient and his father. Dad feels that Franklin [name] had missed some medication doses because the pillbox was empty. [name] could not remember if he had missed doses or not. I talked with him about the importance of compliance and asked him to call to refill the Fycompa soon as it has to be mailed to him. [name] agreed with these plans. TG

## 2022-02-23 ENCOUNTER — Ambulatory Visit (INDEPENDENT_AMBULATORY_CARE_PROVIDER_SITE_OTHER): Payer: Self-pay | Admitting: Family

## 2022-02-23 ENCOUNTER — Encounter (INDEPENDENT_AMBULATORY_CARE_PROVIDER_SITE_OTHER): Payer: Self-pay | Admitting: Family

## 2022-02-23 VITALS — Wt 128.7 lb

## 2022-02-23 DIAGNOSIS — Z5989 Other problems related to housing and economic circumstances: Secondary | ICD-10-CM

## 2022-02-23 DIAGNOSIS — F819 Developmental disorder of scholastic skills, unspecified: Secondary | ICD-10-CM

## 2022-02-23 DIAGNOSIS — F4323 Adjustment disorder with mixed anxiety and depressed mood: Secondary | ICD-10-CM

## 2022-02-23 DIAGNOSIS — G40309 Generalized idiopathic epilepsy and epileptic syndromes, not intractable, without status epilepticus: Secondary | ICD-10-CM

## 2022-02-23 DIAGNOSIS — F411 Generalized anxiety disorder: Secondary | ICD-10-CM

## 2022-02-23 DIAGNOSIS — R634 Abnormal weight loss: Secondary | ICD-10-CM

## 2022-02-23 DIAGNOSIS — Z5971 Insufficient health insurance coverage: Secondary | ICD-10-CM

## 2022-02-23 NOTE — Patient Instructions (Signed)
It was a pleasure to see you today!  Instructions for you until your next appointment are as follows: Continue taking your medications as prescribed. Try not to miss any doses. Remember that it is important for you to get at least 8 hours of sleep each night I will refer you to the behavioral health specialist when that provider starts scheduling appointments in this office Please sign up for MyChart if you have not done so. Please plan to return for follow up in 4 months or sooner if needed.   Feel free to contact our office during normal business hours at [phone] with questions or concerns. If there is no answer or the call is outside business hours, please leave a message and our clinic staff will call you back within the next business day.  If you have an urgent concern, please stay on the line for our after-hours answering service and ask for the on-call neurologist.     I also encourage you to use MyChart to communicate with me more directly. If you have not yet signed up for MyChart within [facility], the front desk staff can help you. However, please note that this inbox is NOT monitored on nights or weekends, and response can take up to 2 business days.  Urgent matters should be discussed with the on-call pediatric neurologist.   At [facility], we are committed to providing exceptional care. You will receive a patient satisfaction survey through text or email regarding your visit today. Your opinion is important to me. Comments are appreciated.

## 2022-02-23 NOTE — Progress Notes (Unsigned)
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 He is taking and tolerating Lamictal XR that he receives from [employer] and Levetiracetam that he purchases from the pharmacy. This can be sporadic at times because he has no income and no insurance coverage. He also receives Fycompa from the [employer].  Today's concerns:  *** has been otherwise generally healthy since he was last seen. Neither *** nor mother have other health concerns for *** today other than previously mentioned.   Review of systems: Please see HPI for neurologic and other pertinent review of systems. Otherwise all other systems were reviewed and were negative.  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 He was  tried on Concerta which caused significant emotional disturbance, then on Ritalin, then Adderall which worked well. He has mild hearing loss. 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   Birth History: He was born at [redacted] weeks gestation, weighing 1 lb 13 1/2 oz. He required ventilator support for sometime. He reportedly had no CNS hemorrhage. Growth and development was delayed.   Surgical history: Past Surgical History:  Procedure Laterality Date   Central line placement     As premature infant   REFRACTIVE SURGERY     For retinopathy of prematurity     Family history: family history includes Autism in his brother; Diabetes type II in his father; Hypercholesterolemia in his father; Hypertension in his father; Stroke in his paternal aunt and paternal grandmother.   Social history: Social History   Socioeconomic History   Marital status: Single    Spouse name: Not on file   Number of children: Not on file   Years of education: Not on file   Highest education level: Not on file  Occupational History   Not on file  Tobacco Use   Smoking status: Never    Passive exposure: Current (Cousin smokes in home)   Smokeless tobacco: Never  Substance and Sexual Activity   Alcohol use: No   Drug use: No   Sexual activity: Not Currently  Other Topics Concern   Not on file  Social  History Narrative   Parents are divorced. Franklin [name] lives with his father.   Social Determinants of Health   Financial Resource Strain: Low Risk  (05/12/2019)   Overall Financial Resource Strain (CARDIA)    Difficulty of Paying Living Expenses: Not hard at all  Food Insecurity: Food Insecurity Present (05/12/2019)   Hunger Vital Sign    Worried About Running Out of Food in the Last Year: Never true    Ran  Out of Food in the Last Year: Sometimes true  Transportation Needs: No Transportation Needs (05/12/2019)   PRAPARE - Administrator, Civil Service (Medical): No    Lack of Transportation (Non-Medical): No  Physical Activity: Sufficiently Active (05/12/2019)   Exercise Vital Sign    Days of Exercise per Week: 7 days    Minutes of Exercise per Session: 30 min  Stress: Stress Concern Present (05/12/2019)   [name] of Occupational Health - Occupational Stress Questionnaire    Feeling of Stress : To some extent  Social Connections: Socially Isolated (05/12/2019)   Social Connection and Isolation Panel [NHANES]    Frequency of Communication with Friends and Family: Once a week    Frequency of Social Gatherings with Friends and Family: Once a week    Attends Religious Services: Never    Database administrator or Organizations: No    Attends Banker Meetings: Never    Marital Status: Never married  Intimate Partner Violence: Not At Risk (05/12/2019)   Humiliation, Afraid, Rape, and Kick questionnaire    Fear of Current or Ex-Partner: No    Emotionally Abused: No    Physically Abused: No    Sexually Abused: No      Past/failed meds: Copied from previous record:    Allergies: Allergies  Allergen Reactions   Concerta [Methylphenidate] Other (See Comments)    Would start crying for little things like when someone coughed or said a specific word for example.       Immunizations: Immunization History  Administered Date(s) Administered   PFIZER(Purple Top)SARS-COV-2 Vaccination 12/08/2019, 01/01/2020   Tdap 06/30/2015, 09/14/2018, 02/18/2022      Diagnostics/Screenings: Copied from previous record: EEG in 2006 - generalized convulsive epilepsy    Physical Exam: There were no vitals taken for this visit.    Wt Readings from Last 3 Encounters:  01/18/22 130 lb (59 kg)  10/22/21 133 lb (60.3 kg)  04/13/21 131 lb (59.4 kg)    General: Thin but well  developed, well nourished, seated, in no evident distress Head: Head normocephalic and atraumatic.  Oropharynx benign. Wearing donated glasses that are not his prescription lenses.  Neck: Supple Cardiovascular: Regular rate and rhythm, no murmurs Respiratory: Breath sounds clear to auscultation Musculoskeletal: No obvious deformities or scoliosis Skin: No rashes or neurocutaneous lesions  Neurologic Exam Mental Status: Awake and fully alert.  Oriented to place and time.  Recent and remote memory intact.  Attention span, concentration, and fund of knowledge subnormal for age.  Mood and affect appropriate. Cranial Nerves: Fundoscopic exam reveals sharp disc margins.  Pupils equal, briskly reactive to light.  Extraocular movements full without nystagmus. Hearing intact and symmetric to whisper.  Facial sensation intact.  Face tongue, palate move normally and symmetrically. Shoulder shrug normal Motor: Normal bulk and tone. Normal strength in all tested extremity muscles. Sensory: Intact to touch and temperature in all extremities.  Coordination: Rapid alternating movements normal in all extremities.  Finger-to-nose and heel-to shin performed accurately bilaterally.  Romberg negative.  Gait and Station: Arises from chair without difficulty.  Stance is normal. Gait demonstrates normal stride length and balance.   Able to heel, toe and tandem walk without difficulty. Reflexes: 1+ and symmetric. Toes downgoing.   Impression: No diagnosis found.    Recommendations for plan of care: The patient's previous Epic records were reviewed. Hiroto has neither had nor required imaging or lab studies since the last visit.   The medication list was reviewed and reconciled. No changes were made in the prescribed medications today. A complete medication list was provided to the patient.  No orders of the defined types were placed in this encounter.   No follow-ups on file.   Allergies as of 02/23/2022        Reactions   Concerta [methylphenidate] Other (See Comments)   Would start crying for little things like when someone coughed or said a specific word for example.         Medication List        Accurate as of February 23, 2022  8:25 AM. If you have any questions, ask your nurse or doctor.          acetaminophen 325 MG tablet Commonly known as: TYLENOL Take 2 tablets (650 mg total) by mouth every 4 (four) hours as needed for mild pain (or temp >/= 99.5).   aspirin EC 325 MG tablet Take 325 mg by mouth daily.   Fycompa 8 MG tablet Generic drug: perampanel Take 1 tablet at bedtime   LaMICtal XR 100 MG 24 hour tablet Generic drug: lamoTRIgine Take 1 tablet every morning along with Lamictal XR 250mg    LaMICtal XR 200 MG Tb24 24 hour tablet Generic drug: LamoTRIgine Take 1 tablet at night along with a Lamictal XR 300mg  tablet   LaMICtal XR 250 MG Tb24 24 hour tablet Generic drug: LamoTRIgine Take 1 tablet in the morning along with Lamictal XR 100mg    LaMICtal XR 300 MG Tb24 24 hour tablet Generic drug: LamoTRIgine Take 1 tablet at night along with Lamictal XR 200mg    levETIRAcetam 500 MG tablet Commonly known as: KEPPRA TAKE 3 TABLETS BY MOUTH IN THE MORNING AND TAKE 3 TABLETS BY MOUTH IN THE EVENING      Total time spent with the patient was *** minutes, of which 50% or more was spent in counseling and coordination of care.  Rockwell Germany NP-C [facility] Ph. [phone] Fax [phone]

## 2022-02-27 ENCOUNTER — Encounter (INDEPENDENT_AMBULATORY_CARE_PROVIDER_SITE_OTHER): Payer: Self-pay | Admitting: Family

## 2022-03-09 ENCOUNTER — Telehealth (INDEPENDENT_AMBULATORY_CARE_PROVIDER_SITE_OTHER): Payer: Self-pay | Admitting: Family

## 2022-03-09 NOTE — Telephone Encounter (Signed)
Who's calling (name and relationship to patient) : Joon Pohle; dad  Best contact number: [phone]  Provider they see: Seward Carol   Reason for call: Dad stated that Glendal had a seizure on Friday morning. Dad has requested   Call ID:      PRESCRIPTION REFILL ONLY  Name of prescription:  Pharmacy:

## 2022-03-09 NOTE — Telephone Encounter (Signed)
Seizure on Friday had missed about 10 days of Keppra  lasted 10 sec, looked like usual seizures, he was in the bed but was able to recognize dad. He reports they have since obtained the Keppra but did have to pay some for it. RN advised will update Franklin Fermo NP

## 2022-03-16 NOTE — Telephone Encounter (Signed)
Noted. TG 

## 2022-03-19 ENCOUNTER — Telehealth (INDEPENDENT_AMBULATORY_CARE_PROVIDER_SITE_OTHER): Payer: Self-pay | Admitting: Family

## 2022-03-19 NOTE — Telephone Encounter (Signed)
Spoke with [employer] and they inform that the Keppra prescription was picked up 03/08/2022, and paid $24.   Spoke with Elveria Rising, and there is not a patient assistance program for Keppra.    Spoke with [name] and let him know. Dad states understanding and ended the call.

## 2022-03-19 NOTE — Telephone Encounter (Signed)
Who's calling (name and relationship to patient) : Catarina Hartshorn; dad   Best contact number: [phone]  Provider they see: Blane Ohara, NP  Reason for call: Dad has called in wanting to speak with Inetta Fermo regarding medication that he has to pay for, he was under the impression that it would be free. Dad has requested a call back.   Call ID:      PRESCRIPTION REFILL ONLY  Name of prescription:  Pharmacy:

## 2022-04-08 ENCOUNTER — Other Ambulatory Visit: Payer: Self-pay

## 2022-04-08 ENCOUNTER — Emergency Department (HOSPITAL_COMMUNITY): Payer: Medicaid Other

## 2022-04-08 ENCOUNTER — Emergency Department (HOSPITAL_COMMUNITY)
Admission: EM | Admit: 2022-04-08 | Discharge: 2022-04-08 | Disposition: A | Discharge status: Home / Self Care | Payer: Medicaid Other | Attending: Emergency Medicine | Admitting: Emergency Medicine

## 2022-04-08 DIAGNOSIS — Z7982 Long term (current) use of aspirin: Secondary | ICD-10-CM | POA: Insufficient documentation

## 2022-04-08 DIAGNOSIS — G40309 Generalized idiopathic epilepsy and epileptic syndromes, not intractable, without status epilepticus: Secondary | ICD-10-CM

## 2022-04-08 DIAGNOSIS — R569 Unspecified convulsions: Secondary | ICD-10-CM | POA: Insufficient documentation

## 2022-04-08 LAB — CBC WITH DIFFERENTIAL/PLATELET
Abs Immature Granulocytes: 0.03 10*3/uL (ref 0.00–0.07)
Basophils Absolute: 0.1 10*3/uL (ref 0.0–0.1)
Basophils Relative: 1 %
Eosinophils Absolute: 0.1 10*3/uL (ref 0.0–0.5)
Eosinophils Relative: 1 %
HCT: 47.1 % (ref 39.0–52.0)
Hemoglobin: 15.4 g/dL (ref 13.0–17.0)
Immature Granulocytes: 0 %
Lymphocytes Relative: 12 %
Lymphs Abs: 1.2 10*3/uL (ref 0.7–4.0)
MCH: 30.7 pg (ref 26.0–34.0)
MCHC: 32.7 g/dL (ref 30.0–36.0)
MCV: 93.8 fL (ref 80.0–100.0)
Monocytes Absolute: 0.7 10*3/uL (ref 0.1–1.0)
Monocytes Relative: 7 %
Neutro Abs: 8 10*3/uL — ABNORMAL HIGH (ref 1.7–7.7)
Neutrophils Relative %: 79 %
Platelets: 386 10*3/uL (ref 150–400)
RBC: 5.02 MIL/uL (ref 4.22–5.81)
RDW: 13.4 % (ref 11.5–15.5)
WBC: 10.2 10*3/uL (ref 4.0–10.5)
nRBC: 0 % (ref 0.0–0.2)

## 2022-04-08 LAB — COMPREHENSIVE METABOLIC PANEL
ALT: 30 U/L (ref 0–44)
AST: 32 U/L (ref 15–41)
Albumin: 3.9 g/dL (ref 3.5–5.0)
Alkaline Phosphatase: 200 U/L — ABNORMAL HIGH (ref 38–126)
Anion gap: 6 (ref 5–15)
BUN: 6 mg/dL (ref 6–20)
CO2: 28 mmol/L (ref 22–32)
Calcium: 8.4 mg/dL — ABNORMAL LOW (ref 8.9–10.3)
Chloride: 105 mmol/L (ref 98–111)
Creatinine, Ser: 0.71 mg/dL (ref 0.61–1.24)
GFR, Estimated: >60 mL/min (ref >60)
Glucose, Bld: 87 mg/dL (ref 70–99)
Potassium: 3.8 mmol/L (ref 3.5–5.1)
Sodium: 139 mmol/L (ref 135–145)
Total Bilirubin: 0.5 mg/dL (ref 0.3–1.2)
Total Protein: 6.9 g/dL (ref 6.5–8.1)

## 2022-04-08 LAB — CBG MONITORING, ED
Glucose-Capillary: 95 mg/dL (ref 70–99)
Glucose-Capillary: 73 mg/dL (ref 70–99)

## 2022-04-08 MED ORDER — DIAZEPAM 5 MG PO TABS
5.0000 mg | ORAL_TABLET | Freq: Once | ORAL | Status: AC
  Administered 2022-04-08: 5 mg via ORAL
  Filled 2022-04-08: qty 1

## 2022-04-08 MED ORDER — LEVETIRACETAM IN NACL 1500 MG/100ML IV SOLN
1500.0000 mg | Freq: Once | INTRAVENOUS | Status: AC
  Administered 2022-04-08: 1500 mg via INTRAVENOUS
  Filled 2022-04-08: qty 100

## 2022-04-08 MED ORDER — LEVETIRACETAM 500 MG PO TABS: 1500.0000 mg | ORAL_TABLET | Freq: Two times a day (BID) | ORAL | 0 refills | Status: DC

## 2022-04-08 NOTE — ED Triage Notes (Signed)
Pt BIBA from home. Pt has seizure on bed, witnessed by father.  No fall/trauma. Pt has hx of seizures. Pt Had been rationing their Keppra because they hadn't picked up a refill.  Aox4 on arrival

## 2022-04-08 NOTE — ED Provider Notes (Signed)
6:18 PM Received signout from previous provider, please see her note for complete H&P. Patient had a witnessed seizure by his father earlier today.  No injury noted.  Does have history of seizure on Keppra but takes it intermittently because the pharmacy have not had time Keppra available for refill.  Labs and imaging independently viewed interpreted by me and I agree with radiologist interpretation.  Labs are reassuring, no significant electrolyte abnormality.  Head CT scan without any acute finding.  Patient received loading dose of Keppra here.  Keppra level is pending however I anticipate it would be low.  On reassessment patient is resting comfortably and is back to his baseline.  A prescription for Keppra have been written and patient is stable to be discharged home to resume his medication.  Return precaution given.  Outpatient with neurology recommended.  Patient show improvement with medication.  I have considered patient's social determinant of health which includes stress, food insecurity, and socially isolated and provide appropriate support.  BP 106/65   Pulse 83   Temp 97.7 F (36.5 C) (Oral)   Resp 18   Ht 5\' 9"  (1.753 m)   Wt 58.5 kg   SpO2 99%   BMI 19.05 kg/m   Results for orders placed or performed during the hospital encounter of 04/08/22  CBC with Differential  Result Value Ref Range   WBC 10.2 4.0 - 10.5 K/uL   RBC 5.02 4.22 - 5.81 MIL/uL   Hemoglobin 15.4 13.0 - 17.0 g/dL   HCT 06/08/22 35.3 - 61.4 %   MCV 93.8 80.0 - 100.0 fL   MCH 30.7 26.0 - 34.0 pg   MCHC 32.7 30.0 - 36.0 g/dL   RDW 43.1 54.0 - 08.6 %   Platelets 386 150 - 400 K/uL   nRBC 0.0 0.0 - 0.2 %   Neutrophils Relative % 79 %   Neutro Abs 8.0 (H) 1.7 - 7.7 K/uL   Lymphocytes Relative 12 %   Lymphs Abs 1.2 0.7 - 4.0 K/uL   Monocytes Relative 7 %   Monocytes Absolute 0.7 0.1 - 1.0 K/uL   Eosinophils Relative 1 %   Eosinophils Absolute 0.1 0.0 - 0.5 K/uL   Basophils Relative 1 %   Basophils  Absolute 0.1 0.0 - 0.1 K/uL   Immature Granulocytes 0 %   Abs Immature Granulocytes 0.03 0.00 - 0.07 K/uL  Comprehensive metabolic panel  Result Value Ref Range   Sodium 139 135 - 145 mmol/L   Potassium 3.8 3.5 - 5.1 mmol/L   Chloride 105 98 - 111 mmol/L   CO2 28 22 - 32 mmol/L   Glucose, Bld 87 70 - 99 mg/dL   BUN 6 6 - 20 mg/dL   Creatinine, Ser 76.1 0.61 - 1.24 mg/dL   Calcium 8.4 (L) 8.9 - 10.3 mg/dL   Total Protein 6.9 6.5 - 8.1 g/dL   Albumin 3.9 3.5 - 5.0 g/dL   AST 32 15 - 41 U/L   ALT 30 0 - 44 U/L   Alkaline Phosphatase 200 (H) 38 - 126 U/L   Total Bilirubin 0.5 0.3 - 1.2 mg/dL   GFR, Estimated 9.50 >93 mL/min   Anion gap 6 5 - 15  CBG monitoring, ED  Result Value Ref Range   Glucose-Capillary 95 70 - 99 mg/dL  POC CBG, ED  Result Value Ref Range   Glucose-Capillary 73 70 - 99 mg/dL   CT HEAD WO CONTRAST (>26)  Result Date: 04/08/2022 CLINICAL DATA:  Provided  history: Dizziness, nonspecific. Additional history provided: Witnessed seizure. History of seizures. EXAM: CT HEAD WITHOUT CONTRAST TECHNIQUE: Contiguous axial images were obtained from the base of the skull through the vertex without intravenous contrast. RADIATION DOSE REDUCTION: This exam was performed according to the departmental dose-optimization program which includes automated exposure control, adjustment of the mA and/or kV according to patient size and/or use of iterative reconstruction technique. COMPARISON:  Prior head CT examinations 02/18/2022 and earlier. FINDINGS: Brain: There is no acute intracranial hemorrhage. No demarcated cortical infarct. No extra-axial fluid collection. No evidence of an intracranial mass. No midline shift. Vascular: No hyperdense vessel. Skull: No fracture or aggressive osseous lesion. Sinuses/Orbits: No mass or acute finding within the imaged orbits. No significant paranasal sinus disease at the imaged levels. IMPRESSION: No evidence of acute intracranial abnormality.  Electronically Signed   By: Jackey Loge D.O.   On: 04/08/2022 17:23       Fayrene Helper, PA-C 04/08/22 Bonney Aid, MD 04/08/22 [phone]

## 2022-04-08 NOTE — Discharge Instructions (Signed)
You have been evaluated for your symptoms.  Please take your Keppra regularly as previously prescribed.  Follow-up with your neurologist for further care.  Return if you have any concern.

## 2022-04-08 NOTE — ED Provider Notes (Signed)
[facility]-EMERGENCY DEPT Provider Note   CSN: [id number] Arrival date & time: 04/08/22  1246     History Seizures Chief Complaint  Patient presents with   Seizures    Franklin [name] is a 26 y.o. male.  26 year old male with a past medical history of seizure disorder presents to the ED status post witnessed seizure.  He was accompanied by his father, when he witnessed a seizure.  Reports was lying on the bed when this occurred, he has returned back to baseline at this time.  Patient reports he has been taking his Keppra however he has not picked up a refill therefore he has been taking this medication sporadically to "ration" his medication.  He describes feeling somewhat dizzy, has no other complaints.  No recent sickness, no drug use, no fevers, no nausea or vomiting.  The history is provided by the patient.  Seizures      Home Medications Prior to Admission medications   Medication Sig Start Date End Date Taking? Authorizing Provider  levETIRAcetam (KEPPRA) 500 MG tablet Take 3 tablets (1,500 mg total) by mouth 2 (two) times daily. 04/08/22 05/08/22 Yes Kalvyn Desa, Leonie Douglas, PA-C  acetaminophen (TYLENOL) 325 MG tablet Take 2 tablets (650 mg total) by mouth every 4 (four) hours as needed for mild pain (or temp >/= 99.5). Patient not taking: Reported on 06/15/2019 05/14/19   Albertine Grates, MD  aspirin EC 325 MG tablet Take 325 mg by mouth daily.    [provider]  FYCOMPA 8 MG tablet Take 1 tablet at bedtime Patient not taking: Reported on 02/23/2022 12/10/21   Elveria Rising, NP  LAMICTAL XR 100 MG 24 hour tablet Take 1 tablet every morning along with Lamictal XR 250mg  08/11/21   14/5/22, NP  LAMICTAL XR 200 MG TB24 24 hour tablet Take 1 tablet at night along with a Lamictal XR 300mg  tablet 08/11/21   , NP  LAMICTAL XR 250 MG TB24 24 hour tablet Take 1 tablet in the morning along with Lamictal XR 100mg  08/11/21   Elveria Rising, NP  LAMICTAL  XR 300 MG TB24 24 hour tablet Take 1 tablet at night along with Lamictal XR 200mg  08/11/21   14/5/22, NP      Allergies    Concerta [methylphenidate]    Review of Systems   Review of Systems  Constitutional:  Negative for fever.  Respiratory:  Negative for shortness of breath.   Cardiovascular:  Negative for chest pain.  Gastrointestinal:  Negative for abdominal pain, nausea and vomiting.  Musculoskeletal:  Negative for back pain.  Neurological:  Positive for dizziness and seizures. Negative for weakness and headaches.  All other systems reviewed and are negative.   Physical Exam Updated Vital Signs BP 106/65   Pulse 83   Temp 97.7 F (36.5 C) (Oral)   Resp 18   Ht 5\' 9"  (1.753 m)   Wt 58.5 kg   SpO2 99%   BMI 19.05 kg/m  Physical Exam Vitals and nursing note reviewed.  Constitutional:      Appearance: Normal appearance. He is not ill-appearing.  HENT:     Head: Normocephalic and atraumatic.     Mouth/Throat:     Comments: No visible signs of injury to the tongue.  Cardiovascular:     Rate and Rhythm: Normal rate.  Pulmonary:     Effort: Pulmonary effort is normal.     Breath sounds: No wheezing or rales.  Abdominal:  General: Abdomen is flat.     Tenderness: There is no right CVA tenderness, left CVA tenderness or guarding.  Musculoskeletal:     Cervical back: Normal range of motion and neck supple.  Skin:    General: Skin is warm and dry.  Neurological:     Mental Status: He is alert and oriented to person, place, and time.     Comments: Alert, oriented, thought content appropriate. Speech fluent without evidence of aphasia. Able to follow 2 step commands without difficulty.  Cranial Nerves:  II:  Peripheral visual fields grossly normal, pupils, round, reactive to light III,IV, VI: ptosis not present, extra-ocular motions intact bilaterally  V,VII: smile symmetric, facial light touch sensation equal VIII: hearing grossly normal bilaterally  IX,X:  midline uvula rise  XI: bilateral shoulder shrug equal and strong XII: midline tongue extension  Motor:  5/5 in upper and lower extremities bilaterally including strong and equal grip strength and dorsiflexion/plantar flexion Sensory: light touch normal in all extremities.  Cerebellar: normal finger-to-nose with bilateral upper extremities, pronator drift negative       ED Results / Procedures / Treatments   Labs (all labs ordered are listed, but only abnormal results are displayed) Labs Reviewed  CBC WITH DIFFERENTIAL/PLATELET - Abnormal; Notable for the following components:      Result Value   Neutro Abs 8.0 (*)    All other components within normal limits  COMPREHENSIVE METABOLIC PANEL - Abnormal; Notable for the following components:   Calcium 8.4 (*)    Alkaline Phosphatase 200 (*)    All other components within normal limits  LEVETIRACETAM LEVEL  CBG MONITORING, ED  CBG MONITORING, ED    EKG None  Radiology CT HEAD WO CONTRAST ( )  Result Date: 04/08/2022 CLINICAL DATA:  Provided history: Dizziness, nonspecific. Additional history provided: Witnessed seizure. History of seizures. EXAM: CT HEAD WITHOUT CONTRAST TECHNIQUE: Contiguous axial images were obtained from the base of the skull through the vertex without intravenous contrast. RADIATION DOSE REDUCTION: This exam was performed according to the departmental dose-optimization program which includes automated exposure control, adjustment of the mA and/or kV according to patient size and/or use of iterative reconstruction technique. COMPARISON:  Prior head CT examinations 02/18/2022 and earlier. FINDINGS: Brain: There is no acute intracranial hemorrhage. No demarcated cortical infarct. No extra-axial fluid collection. No evidence of an intracranial mass. No midline shift. Vascular: No hyperdense vessel. Skull: No fracture or aggressive osseous lesion. Sinuses/Orbits: No mass or acute finding within the imaged orbits. No  significant paranasal sinus disease at the imaged levels. IMPRESSION: No evidence of acute intracranial abnormality. Electronically Signed   By: Jackey Loge D.O.   On: 04/08/2022 17:23    Procedures Procedures    Medications Ordered in ED Medications  levETIRAcetam (KEPPRA) IVPB 1500 mg/ 100 mL premix (0 mg Intravenous Stopped 04/08/22 1539)  diazepam (VALIUM) tablet 5 mg (5 mg Oral Given 04/08/22 1542)    ED Course/ Medical Decision Making/ A&P                           Medical Decision Making Amount and/or Complexity of Data Reviewed Labs: ordered. Radiology: ordered.  Risk Prescription drug management.    This patient presents to the ED for concern of seizure, this involves a number of treatment options, and is a complaint that carries with it a high risk of complications and morbidity.  The differential diagnosis includes non compliance with medication, infection versus illicit  substance use.    Co morbidities: Discussed in HPI   Brief History:  Patient with prior hx of seizures here s/p witnessed seizure back to baseline. Non compliant with medication. No sick contacts, no prior drug use.   EMR reviewed including pt PMHx, past surgical history and past visits to ER.   See HPI for more details   Lab Tests:  I ordered and independently interpreted labs.  The pertinent results include:    I personally reviewed all laboratory work and imaging. Metabolic panel without any acute abnormality specifically kidney function within normal limits and no significant electrolyte abnormalities. CBC without leukocytosis or significant anemia.   Imaging Studies:  CT head pending.    Medicines ordered:  I ordered medication including Keppra loading dose  for seizure disorder Reevaluation of the patient after these medicines showed that the patient improved I have reviewed the patients home medicines and have made adjustments as needed  Reevaluation:  After the  interventions noted above I re-evaluated patient and found that they have :improved Patient without any seizure episode after being in the emergency department for approximately 2 hours.  Patient was ambulated to the bathroom, he became dizzy.  Father at the bedside orts patient had a similar episode this morning.  Social Determinants of Health:   Currently with father [name] who is living with him at his sister's house.    Problem List / ED Course:  Patient here with a prior history of seizures [place] to the ED after a witnessed seizure episode by his father.  Father [name] also reports patient became suddenly dizzy upon standing today, felt like his room was spinning.  Patient did not voice these complaints to me earlier.  However, patient does have a legal guardian established.  He has been taking his medications sporadically, I did give him a Keppra loading dose.  His labs are without any signs of infection.  UA is currently pending.  Patient had a second episode of dizziness while in the ED. does not also reports he did have a fall earlier today.  I do feel that patient needs CT head to rule out any intracranial hemorrhage.  He is without any focal neurodeficit on my exam.  Also given Valium for symptomatic control.  Dispostion:  Patient signed out to incoming provider pending CT head.    Portions of this note were generated with Scientist, clinical (histocompatibility and immunogenetics). Dictation errors may occur despite best attempts at proofreading.   Final Clinical Impression(s) / ED Diagnoses Final diagnoses:  Seizure [facility])    Rx / DC Orders ED Discharge Orders          Ordered    levETIRAcetam (KEPPRA) 500 MG tablet  2 times daily        04/08/22 1523              Claude Manges, PA-C 04/09/22 0734    Gwyneth Sprout, MD 04/09/22 [phone]

## 2022-04-09 LAB — LEVETIRACETAM LEVEL: Levetiracetam Lvl: 2.1 ug/mL — ABNORMAL LOW (ref 10.0–40.0)

## 2022-04-27 ENCOUNTER — Telehealth (INDEPENDENT_AMBULATORY_CARE_PROVIDER_SITE_OTHER): Payer: Self-pay | Admitting: Family

## 2022-04-27 NOTE — Telephone Encounter (Signed)
I called [employer] Patient [employer] and learned that the previous enrollment had expired on 04/09/22. He needs a new application submitted. I called Dad and asked him to bring Franklin [name] in to sign the application this week. Dad agreed to do so. TG

## 2022-04-27 NOTE — Telephone Encounter (Signed)
  Name of who is calling: Roger Shelter Relationship to Patient: Dad  Best contact number: [phone]  Provider they see: Elveria Rising  Reason for call: Dad was calling in to let provider know that Joshual had a seizure. Dad is requesting a callback.      PRESCRIPTION REFILL ONLY  Name of prescription:  Pharmacy:

## 2022-04-27 NOTE — Telephone Encounter (Signed)
I called and spoke with Dad. He said that yesterday Franklin [name] was sitting in a chair watching TV and had a seizure lasting about a minute. He has not missed doses of medication and has been getting more sleep recently. Dad also asked about the [employer] Patient Assistance program refills. I told Dad that I will call [employer] to inquire what is needed and will call him back. TG

## 2022-04-28 ENCOUNTER — Other Ambulatory Visit (INDEPENDENT_AMBULATORY_CARE_PROVIDER_SITE_OTHER): Payer: Self-pay | Admitting: Family

## 2022-04-28 DIAGNOSIS — G40309 Generalized idiopathic epilepsy and epileptic syndromes, not intractable, without status epilepticus: Secondary | ICD-10-CM

## 2022-04-28 MED ORDER — LAMICTAL XR 300 MG PO TB24: ORAL_TABLET | ORAL | 3 refills | Status: DC

## 2022-04-28 MED ORDER — LAMICTAL XR 200 MG PO TB24: ORAL_TABLET | ORAL | 3 refills | Status: DC

## 2022-04-28 MED ORDER — LAMICTAL XR 100 MG PO TB24: ORAL_TABLET | ORAL | 3 refills | Status: DC

## 2022-04-28 MED ORDER — LAMICTAL XR 250 MG PO TB24: ORAL_TABLET | ORAL | 3 refills | Status: DC

## 2022-05-18 ENCOUNTER — Telehealth (INDEPENDENT_AMBULATORY_CARE_PROVIDER_SITE_OTHER): Payer: Self-pay | Admitting: Family

## 2022-05-18 NOTE — Telephone Encounter (Signed)
Franklin [name] called to report that he had 2 seizures yesterday. He denies missed medication doses and says that he has been staying on a sleep schedule. He reports feeling tired today. I instructed [name] to rest today and not to go out walking as he usually does during the day. I would like to make changes in his medication but am severely limited by his lack of insurance. TG

## 2022-05-29 ENCOUNTER — Encounter (INDEPENDENT_AMBULATORY_CARE_PROVIDER_SITE_OTHER): Payer: Self-pay | Admitting: Family

## 2022-05-29 ENCOUNTER — Other Ambulatory Visit (INDEPENDENT_AMBULATORY_CARE_PROVIDER_SITE_OTHER): Payer: Self-pay | Admitting: Family

## 2022-05-29 MED ORDER — BRIVIACT 50 MG PO TABS
ORAL_TABLET | ORAL | 5 refills | Status: DC
Start: 2022-05-29 — End: 2022-06-22

## 2022-05-29 MED ORDER — BRIVIACT 100 MG PO TABS: ORAL_TABLET | ORAL | 5 refills | Status: DC

## 2022-05-29 NOTE — Progress Notes (Signed)
I contacted Franklin [name] about the [employer] patient assistance program. He came in and completed the application form, which I faxed to [employer]. TG

## 2022-06-02 ENCOUNTER — Telehealth (INDEPENDENT_AMBULATORY_CARE_PROVIDER_SITE_OTHER): Payer: Self-pay | Admitting: Family

## 2022-06-02 NOTE — Telephone Encounter (Signed)
Dad stated Franklin [name] could give him a call if she needs to.

## 2022-06-02 NOTE — Telephone Encounter (Signed)
General Seizure Questions   Ask frequency of seizures - number in a day, week, month, etc. Ask when last seizure occurred.  Father stated pt had 2-3 seizures around 9-10 O'Clock. Dad states they lasted a few seconds.    Ask to describe seizures - if caller says "usual seizures", get description anyway.  Dad sates the the last seizure was convulsion like, pt was was trembling. The first two seizures dad states pt was kicking his legs.    Ask about seizure medications - verify dose, type, frequency, compliance. Ask about side effects.   Dad states that pt has been taking the  BRIVIACT 100 MG as well as the 50 MG as prescribed.  Continued Lamictal as prescribed Pt Stopped taking the KEPPRA.    Ask if the patient has been sick, under undue stress, has missed sleep.   Dad states that pt hasn't been sick that he knows of. Pt has been trying to look for employment.    If the caller reports a rash, ask when the med was started, if any other meds were given at the same time, any different foods, detergents, lotions, etc. Get description of rash, along with any other symptoms with the rash (nausea, vomiting, diarrhea, etc). Sometimes best to have patient stop by to look at rash if parents have difficulty describing or are unreliable in description.  Dad does not report any rash.

## 2022-06-02 NOTE — Telephone Encounter (Signed)
Who's calling (name and relationship to patient) : Anvith Mauriello; dad   Best contact number: [phone]  Provider they see: Cloretta Ned, NP  Reason for call: Dad called in stating that he wanted to let Otila Kluver know that Quaran has had a couple of seizures this morning and he's not sure if the medication is what caused it.   Call ID:      PRESCRIPTION REFILL ONLY  Name of prescription:  Pharmacy:

## 2022-06-02 NOTE — Telephone Encounter (Signed)
I called and left a message for Franklin [name].TG

## 2022-06-15 ENCOUNTER — Telehealth (INDEPENDENT_AMBULATORY_CARE_PROVIDER_SITE_OTHER): Payer: Self-pay | Admitting: Family

## 2022-06-15 NOTE — Telephone Encounter (Signed)
Spoke with Franklin [name] prefers the Friday at 11:30 Appt scheduled

## 2022-06-15 NOTE — Telephone Encounter (Signed)
Please call and ask Dad if he can bring Shaquel in this Weds at 9:30 AM (right now there is an opening at that time) or this Friday at 11:300 AM. Thanks, Otila Kluver

## 2022-06-15 NOTE — Telephone Encounter (Signed)
  Dad Lanae Boast) came in the office to let you know.  Relationship to Patient: dad  Best contact number: [phone]  Provider they see: Otila Kluver  Reason for call: Dad came in to let you know Franklin [name] had about 2 seizures that last 5-10 seizures yesterday. He isn't sure if he missed a dose.

## 2022-06-19 ENCOUNTER — Ambulatory Visit (INDEPENDENT_AMBULATORY_CARE_PROVIDER_SITE_OTHER): Payer: Self-pay | Admitting: Family

## 2022-06-19 ENCOUNTER — Encounter (INDEPENDENT_AMBULATORY_CARE_PROVIDER_SITE_OTHER): Payer: Self-pay | Admitting: Family

## 2022-06-19 VITALS — BP 118/78 | HR 88 | Wt 124.4 lb

## 2022-06-19 DIAGNOSIS — G40309 Generalized idiopathic epilepsy and epileptic syndromes, not intractable, without status epilepticus: Secondary | ICD-10-CM

## 2022-06-19 DIAGNOSIS — Z5989 Other problems related to housing and economic circumstances: Secondary | ICD-10-CM

## 2022-06-19 DIAGNOSIS — F819 Developmental disorder of scholastic skills, unspecified: Secondary | ICD-10-CM

## 2022-06-19 DIAGNOSIS — R634 Abnormal weight loss: Secondary | ICD-10-CM

## 2022-06-19 MED ORDER — FYCOMPA 8 MG PO TABS: ORAL_TABLET | ORAL | 3 refills | Status: DC

## 2022-06-19 NOTE — Patient Instructions (Signed)
It was a pleasure to see you today!  Instructions for you until your next appointment are as follows: Continue taking your medications as prescribed for now We will schedule you for an EEG at this office. I will call you when I receive the results  I have given you some information on local food banks and an application for [employer]  Please sign up for MyChart if you have not done so. Please plan to return for follow up in 1 month or sooner if needed.  Feel free to contact our office during normal business hours at [phone] with questions or concerns. If there is no answer or the call is outside business hours, please leave a message and our clinic staff will call you back within the next business day.  If you have an urgent concern, please stay on the line for our after-hours answering service and ask for the on-call neurologist.     I also encourage you to use MyChart to communicate with me more directly. If you have not yet signed up for MyChart within [facility], the front desk staff can help you. However, please note that this inbox is NOT monitored on nights or weekends, and response can take up to 2 business days.  Urgent matters should be discussed with the on-call pediatric neurologist.   At [facility], we are committed to providing exceptional care. You will receive a patient satisfaction survey through text or email regarding your visit today. Your opinion is important to me. Comments are appreciated.

## 2022-06-19 NOTE — Progress Notes (Unsigned)
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 He is taking and tolerating Lamictal XR that he receives from [place] [facility] and Levetiracetam that he purchases from the pharmacy. This can be sporadic at times because he has no income and no insurance coverage. He also receives Fycompa from the [employer].  Today's concerns: Franklin [name] is seen today for follow up for recent seizures. He has had breakthrough seizures in July, August, September and this month. He was switched to [place] in late September that he receives through a patient assistance program. He is also taking Lamotrigine and Fycompa that he receives through patient assistance programs. Franklin [name] is complaint with his medication but continues to experience frequent breakthrough seizures.   Franklin [name] has been otherwise generally healthy since he was last seen. Neither he nor his father have other health concerns for him today other than previously mentioned.  Review of systems: Please see HPI for neurologic and other pertinent review of systems. Otherwise all other systems were reviewed and were negative.  Problem List: Patient Active Problem List   Diagnosis Date Noted   Does not have health insurance 10/24/2021   Adjustment disorder with mixed anxiety and depressed mood 09/10/2021   Poor sleep 03/01/2021   Generalized anxiety disorder 04/10/2020   Loss of weight 04/10/2020   Seizure ([place]) 05/13/2019   Hypokalemia 05/12/2019   Hypomagnesemia 05/12/2019   Hypocalcemia 05/12/2019   Intellectual delay 03/10/2018   Personal history of prematurity  03/10/2018   Generalized convulsive epilepsy ([place]) 06/14/2013   Long-term use of high-risk medication 06/14/2013     Past Medical History:  Diagnosis Date   Prematurity    Retinopathy of prematurity    Seizures ([place])     Past medical history comments: See HPI Copied from previous record: When he was in school he was diagnosed 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 He was tried on Concerta which caused significant emotional disturbance, then on Ritalin, then Adderall which worked well. He has mild hearing loss. 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   Birth History: He was born at [redacted] weeks gestation, weighing 1 lb 13 1/2 oz. He required ventilator support for sometime. He reportedly had no CNS hemorrhage. Growth and development was delayed.   Surgical history: Past Surgical History:  Procedure Laterality Date   Central line placement     As premature infant   REFRACTIVE SURGERY     For retinopathy of prematurity     Family history: family history includes Autism in his brother; Diabetes type II in his father; Hypercholesterolemia in his father; Hypertension in his father; Stroke in his paternal aunt and paternal grandmother.   Social history: Social History   Socioeconomic History   Marital status: Single    Spouse name: Not on file   Number of children: Not on file   Years of education: Not on file   Highest education level:  Not on file  Occupational History   Not on file  Tobacco Use   Smoking status: Never    Passive exposure: Current (Cousin smokes in home)   Smokeless tobacco: Never  Substance and Sexual Activity   Alcohol use: No   Drug use: No   Sexual activity: Not Currently  Other Topics Concern    Not on file  Social History Narrative   Parents are divorced. Franklin [name] lives with his father.   Social Determinants of Health   Financial Resource Strain: Low Risk  (05/12/2019)   Overall Financial Resource Strain (CARDIA)    Difficulty of Paying Living Expenses: Not hard at all  Food Insecurity: Food Insecurity Present (05/12/2019)   Hunger Vital Sign    Worried About Running Out of Food in the Last Year: Never true    [place] in the Last Year: Sometimes true  Transportation Needs: No Transportation Needs (05/12/2019)   PRAPARE - Hydrologist (Medical): No    Lack of Transportation (Non-Medical): No  Physical Activity: Sufficiently Active (05/12/2019)   Exercise Vital Sign    Days of Exercise per Week: 7 days    Minutes of Exercise per Session: 30 min  Stress: Stress Concern Present (05/12/2019)   Thomas    Feeling of Stress : To some extent  Social Connections: Socially Isolated (05/12/2019)   Social Connection and Isolation Panel [NHANES]    Frequency of Communication with Friends and Family: Once a week    Frequency of Social Gatherings with Friends and Family: Once a week    Attends Religious Services: Never    Marine scientist or Organizations: No    Attends Archivist Meetings: Never    Marital Status: Never married  Intimate Partner Violence: Not At Risk (05/12/2019)   Humiliation, Afraid, Rape, and Kick questionnaire    Fear of Current or Ex-Partner: No    Emotionally Abused: No    Physically Abused: No    Sexually Abused: No    Past/failed meds:  Allergies: Allergies  Allergen Reactions   Concerta [Methylphenidate] Other (See Comments)    Would start crying for little things like when someone coughed or said a specific word for example.     Immunizations: Immunization History  Administered Date(s) Administered   PFIZER(Purple Top)SARS-COV-2 Vaccination  12/08/2019, 01/01/2020   Tdap 06/30/2015, 09/14/2018, 02/18/2022    Diagnostics/Screenings: Copied from previous record: 04/08/2022 - CT head wo contrast - No evidence of acute intracranial abnormality.  EEG in 2006 - generalized convulsive epilepsy   Physical Exam: BP 118/78   Pulse 88   Wt 124 lb 6.4 oz (56.4 kg)   BMI 18.37 kg/m  Wt Readings from Last 3 Encounters:  06/19/22 124 lb 6.4 oz (56.4 kg)  04/08/22 129 lb (58.5 kg)  02/23/22 128 lb 12 oz (58.4 kg)    General: Thin but well developed, well nourished young man, seated on exam table, in no evident distress Head: Head normocephalic and atraumatic.  Oropharynx benign. Neck: Supple Cardiovascular: Regular rate and rhythm, no murmurs Respiratory: Breath sounds clear to auscultation Musculoskeletal: No obvious deformities or scoliosis Skin: No rashes or neurocutaneous lesions  Neurologic Exam Mental Status: Awake and fully alert.  Oriented to place and time.  Recent and remote memory intact.  Attention span, concentration, and fund of knowledge appropriate.  Mood and affect appropriate. Cranial Nerves: Fundoscopic exam reveals sharp disc margins.  Pupils equal, briskly reactive to light.  Extraocular movements full without nystagmus. Hearing intact and symmetric to whisper.  Facial sensation intact.  Face tongue, palate move normally and symmetrically. Shoulder shrug normal Motor: Normal bulk and tone. Normal strength in all tested extremity muscles. Sensory: Intact to touch and temperature in all extremities.  Coordination: Rapid alternating movements normal in all extremities.  Finger-to-nose and heel-to shin performed accurately bilaterally.  Romberg negative. Gait and Station: Arises from chair without difficulty.  Stance is normal. Gait demonstrates normal stride length and balance.   Able to heel, toe and tandem walk without difficulty. Reflexes: 1+ and symmetric. Toes downgoing.   Impression: Generalized convulsive  epilepsy ([place]) - Plan: EEG Child, FYCOMPA 8 MG tablet  Intellectual delay  Does not have health insurance  Loss of weight    Recommendations for plan of care: The patient's previous Epic records were reviewed. Franklin [name] has neither had nor required imaging or lab studies since the last visit. He has been experiencing breakthrough seizures despite compliance with medication. I recommended performing an EEG before making any changes in medications. We are limited by his lack of health insurance in terms of choosing medications for him. I will call Franklin [name] when I receive the EEG results.   Franklin [name] has lost weight today and he and his father report that he often misses meals because of lack of food. I gave them resources for food banks in the area. We talked about how to apply for financial assistance through [facility] to see if that would help with prescription medications and other resources.   The medication list was reviewed and reconciled. No changes were made in the prescribed medications today. A complete medication list was provided to the patient.  Orders Placed This Encounter  Procedures   EEG Child    Standing Status:   Future    Standing Expiration Date:   06/20/2023    Scheduling Instructions:     Breakthrough seizures despite compliance with medication    Order Specific Question:   Where should this test be performed?    Answer:   [facility]    Order Specific Question:   Reason for exam    Answer:   Seizure    Return in about 1 month (around 07/20/2022).   Allergies as of 06/19/2022       Reactions   Concerta [methylphenidate] Other (See Comments)   Would start crying for little things like when someone coughed or said a specific word for example.         Medication List        Accurate as of June 19, 2022 11:59 PM. If you have any questions, ask your nurse or doctor.          STOP taking these medications    levETIRAcetam 500 MG tablet Commonly  known as: Keppra Stopped by: Rockwell Germany, NP       TAKE these medications    acetaminophen 325 MG tablet Commonly known as: TYLENOL Take 2 tablets (650 mg total) by mouth every 4 (four) hours as needed for mild pain (or temp >/= 99.5).   aspirin EC 325 MG tablet Take 325 mg by mouth daily.   Briviact 50 MG Tabs Generic drug: Brivaracetam Take 1 tablet twice per day. This is in addition to Briviact 100mg  twice per day for total of 150mg  twice per day.   Briviact 100 MG Tabs tablet Generic drug: brivaracetam Take 1 tablet twice per day. This is  in addition to Briviact 50mg  twice per day for total of 150mg  twice per day.   Fycompa 8 MG tablet Generic drug: perampanel Take 1 tablet at bedtime   LaMICtal XR 100 MG 24 hour tablet Generic drug: lamoTRIgine Take 1 tablet every morning along with Lamictal XR 250mg    LaMICtal XR 200 MG Tb24 24 hour tablet Generic drug: LamoTRIgine Take 1 tablet at night along with a Lamictal XR 300mg  tablet   LaMICtal XR 250 MG Tb24 24 hour tablet Generic drug: LamoTRIgine Take 1 tablet in the morning along with Lamictal XR 100mg    LaMICtal XR 300 MG Tb24 24 hour tablet Generic drug: LamoTRIgine Take 1 tablet at night along with Lamictal XR 200mg       Total time spent with the patient was 20 minutes, of which 50% or more was spent in counseling and coordination of care.  Rockwell Germany NP-C [facility] Ph. [phone] Fax [phone]

## 2022-06-21 ENCOUNTER — Encounter (INDEPENDENT_AMBULATORY_CARE_PROVIDER_SITE_OTHER): Payer: Self-pay | Admitting: Family

## 2022-06-22 ENCOUNTER — Telehealth (INDEPENDENT_AMBULATORY_CARE_PROVIDER_SITE_OTHER): Payer: Self-pay | Admitting: Family

## 2022-06-22 ENCOUNTER — Other Ambulatory Visit (INDEPENDENT_AMBULATORY_CARE_PROVIDER_SITE_OTHER): Payer: Self-pay | Admitting: Family

## 2022-06-22 DIAGNOSIS — G40309 Generalized idiopathic epilepsy and epileptic syndromes, not intractable, without status epilepticus: Secondary | ICD-10-CM

## 2022-06-22 MED ORDER — BRIVIACT 50 MG PO TABS: ORAL_TABLET | ORAL | 5 refills | Status: DC

## 2022-06-22 MED ORDER — BRIVIACT 100 MG PO TABS: ORAL_TABLET | ORAL | 5 refills | Status: DC

## 2022-06-22 NOTE — Telephone Encounter (Signed)
ERROR

## 2022-06-22 NOTE — Telephone Encounter (Signed)
Per Otila Kluver : I called and spoke to Axle's father. I told him that I had called the Fycompa patient assistance and that Marquette needs to call them for a refill. For the [place] patient assistance, that is still in process and has not yet been approved. I will contact the Briviact rep to see if I can get more samples for Finnian while this is still pending, and will let him know if I can get some. TG  See previous Phone Note.   SS, CCMA

## 2022-06-22 NOTE — Telephone Encounter (Signed)
For Dr Rogers Blocker - would you print and sign these prescriptions for me? Thanks, Otila Kluver

## 2022-06-22 NOTE — Telephone Encounter (Signed)
  Name of who is calling:  Franklin [name]  Caller's Relationship to Patient: self  Best contact number: [phone]  Provider they see: Rockwell Germany  Reason for call: Would like a return call from [facility] in reference to his scripts with no refills left and had questions about Briviact.     PRESCRIPTION REFILL ONLY  Name of prescription: Fycompa 8mg  Briviact 100mg  and Briviact 50 mg   Pharmacy:

## 2022-06-22 NOTE — Telephone Encounter (Signed)
  Name of who is calling: Lysbeth Galas Relationship to Patient: Dad  Best contact number: [phone]  Provider they see: Rockwell Germany  Reason for call: Dad is calling to get prior auth for prescription.      PRESCRIPTION REFILL ONLY  Name of prescription: BRIVIACT  Pharmacy:

## 2022-06-22 NOTE — Telephone Encounter (Signed)
I called Oryon. He said that he has about a week left of the Fycompa and the Briviact. I told him that I will call the patient assistance companies and call him back. TG

## 2022-06-22 NOTE — Telephone Encounter (Signed)
  Name of who is calling: [employer] patient assistance program  Caller's Relationship to Patient:  Best contact number: [phone] opt 2, opt 2 or  Fax# [phone] electronic [place]  Provider they see: Otila Kluver  Reason for call: Approved the Briviact 100 mg but needs to be sent in with a physicians name and signature instead of NP.      [place]  Name of prescription:  Pharmacy:

## 2022-06-22 NOTE — Telephone Encounter (Signed)
I called and spoke to Franklin [name]'s father. I told him that I had called the Fycompa patient assistance and that Franklin [name] needs to call them for a refill. For the Franklin [name] patient assistance, that is still in process and has not yet been approved. I will contact the Briviact rep to see if I can get more samples for Franklin [name] while this is still pending, and will let him know if I can get some. TG

## 2022-06-22 NOTE — Telephone Encounter (Signed)
I faxed the Rx (signed by Dr Rogers Blocker) to the patient assistance program. TG

## 2022-06-26 NOTE — Telephone Encounter (Signed)
I called Dad to let him know that I have samples of Briviact for Franklin [name] to pick up when he has transportation to do so. TG

## 2022-06-29 ENCOUNTER — Encounter (INDEPENDENT_AMBULATORY_CARE_PROVIDER_SITE_OTHER): Payer: Self-pay | Admitting: Family

## 2022-06-29 ENCOUNTER — Telehealth (INDEPENDENT_AMBULATORY_CARE_PROVIDER_SITE_OTHER): Payer: Self-pay | Admitting: Family

## 2022-06-29 DIAGNOSIS — G40309 Generalized idiopathic epilepsy and epileptic syndromes, not intractable, without status epilepticus: Secondary | ICD-10-CM

## 2022-06-29 NOTE — Telephone Encounter (Signed)
I called and spoke to Dad. He said that Haik had not missed any doses of medication and had been staying on a sleep schedule. He had 3 seizures yesterday and 1 seizure today that involved a hard jerk, then brief loss of awareness. He said that the events lasted seconds and that he went soundly to sleep afterwards. Chancelor has an EEG scheduled for 07/02/22 and I encouraged Dad to keep that appointment. I will call him when I receive the results. He agreed with this plan. TG

## 2022-06-29 NOTE — Telephone Encounter (Signed)
  Name of who is calling: Lysbeth Galas Relationship to Patient: Dad  Best contact number: [phone]  Provider they see: Rockwell Germany  Reason for call: Dad called and stated that [facility] had 3 seizures yesterday and 1 this morning. Dad is requesting a callback.      PRESCRIPTION REFILL ONLY  Name of prescription:  Pharmacy:

## 2022-07-02 ENCOUNTER — Other Ambulatory Visit (INDEPENDENT_AMBULATORY_CARE_PROVIDER_SITE_OTHER): Payer: Self-pay

## 2022-07-02 ENCOUNTER — Ambulatory Visit (INDEPENDENT_AMBULATORY_CARE_PROVIDER_SITE_OTHER): Payer: Medicaid Other | Admitting: Family

## 2022-07-02 NOTE — Addendum Note (Signed)
Addended by: Blair Heys B on: 07/02/2022 12:00 PM   Modules accepted: Orders

## 2022-07-02 NOTE — Telephone Encounter (Signed)
Call to Mr. Kliethermes to inform him the letter for [employer] food assistance is ready and RN was going to give it to them when they came for his EEG today. Dad reports he did not realize it was today. RN  advised cancellation for tomorrow at 11 dad reports he will take it and pick up the letter at that time. RN advised she printed off another Medicaid application, S[employer] information and [employer] information and highlighted names and numbers of people to assist with application if needed. Dad reports he will pick it up.

## 2022-07-03 ENCOUNTER — Ambulatory Visit (INDEPENDENT_AMBULATORY_CARE_PROVIDER_SITE_OTHER): Payer: Self-pay | Admitting: Pediatrics

## 2022-07-03 ENCOUNTER — Telehealth (INDEPENDENT_AMBULATORY_CARE_PROVIDER_SITE_OTHER): Payer: Self-pay | Admitting: Family

## 2022-07-03 DIAGNOSIS — G40919 Epilepsy, unspecified, intractable, without status epilepticus: Secondary | ICD-10-CM

## 2022-07-03 DIAGNOSIS — G40309 Generalized idiopathic epilepsy and epileptic syndromes, not intractable, without status epilepticus: Secondary | ICD-10-CM

## 2022-07-03 NOTE — Progress Notes (Signed)
EEG complete - results pending 

## 2022-07-03 NOTE — Telephone Encounter (Signed)
General Seizure Questions   Ask frequency of seizures - number in a day, week, month, etc. Ask when last seizure occurred.    - Father states that pt has a seizure this morning for about 10 - 15 seconds.    Ask to describe seizures - if caller says "usual seizures", get description anyway.   - Father stated that the pt was jerking his left arm and grunting. Father states that the pt's eyes were rolling for a short period of time.    Ask about seizure medications - verify dose, type, frequency, compliance. Ask about side effects.   - Father states that pt took his seizure medication after the seizure this morning.    Ask if the patient has been sick, under undue stress, has missed sleep.   - Father states that he isn't aware of any stress or lack of sleep for the patient. Father did states that the patient has been helping dad do stuff around the house and for his mom.    If the caller reports a rash, ask when the med was started, if any other meds were given at the same time, any different foods, detergents, lotions, etc. Get description of rash, along with any other symptoms with the rash (nausea, vomiting, diarrhea, etc). Sometimes best to have patient stop by to look at rash if parents have difficulty describing or are unreliable in description.    - Dad states that mom washed his close recently. He doesn't know if she used a different detergent or not.   SS, CCMA

## 2022-07-03 NOTE — Telephone Encounter (Signed)
Franklin [name] (Dad)  Best contact number: [phone]  Provider they see: Rockwell Germany   When pt came in for the EEG, dad let me know Kerrigan had a seizure this morning round 7am. He said it lasted about 15 seconds.

## 2022-07-06 ENCOUNTER — Encounter (INDEPENDENT_AMBULATORY_CARE_PROVIDER_SITE_OTHER): Payer: Self-pay | Admitting: Pediatrics

## 2022-07-06 NOTE — Progress Notes (Signed)
Patient chart reviewed at the request of Rockwell Germany NP due to intractable epilepsy.  Patient with history of prematurity with resulting ID, anxiety and depression, poor sleep and intractible epilepsy.   Seizure history:  Seizure semiology:   Reported today:  Staring, behavioral arrest, some jerking Jerking of left arm and grunting.    Current antiepileptic Drugs:Lamictal 366m qam, 505mhs (supratherapeutic doses), Fycompa 3m57mwith side effects), Briviact 150m31mD (supratherapeutic doses)  Previous Antiepileptic Drugs (AED): Keppra  Labwork: No recent levels, Alk Phos has been consistently high.   Risk Factors: Mood disorder, history of noncompliance, no insurance coverage.  Should qualify for medicaid, but doesn't per family despite trying multiple times.   Relevent imaging/EEGS:  EEG 07/06/22- slowing but no epileptic activity EEG in 2006 - generalized convulsive epilepsy  Multiple CT heads, last 04/08/22 with ventriculomegaly on the right.   Recommendations:  Decrease Lamictal, high doses likely adding to side effects Consider Trileptal/Oxtellar.  Would need to figure out insurance coverage.  Consider VNS.  Would need indigent care qualification.   Needs PCP    StepCarylon PerchesMPH

## 2022-07-06 NOTE — Progress Notes (Signed)
Patient: RUSELL MENEELY MRN: [id number] Sex: male DOB: 04/05/1996  Clinical History: Jupiter is a 26 y.o. with history of prematurity with resulting intractible epilepsy, ID, and anxiety.  Recently with increasing seizures.  EEG to further evaluate etiology of seizures.   Medications: Lamictal, Briviact, Fycompa  Procedure: The tracing is carried out on a 32-channel digital Natus recorder, reformatted into 16-channel montages with 1 devoted to EKG.  The patient was awake and drowsy during the recording.  The international 10/20 system lead placement used.  Recording time 30 minutes.  Video monitoring was run concurrently with electroencephalogram.   Description of Findings: Background rhythm is composed of mixed amplitude and frequency with a posterior dominant rythym of 35 microvolt and frequency of 6 hertz. There was normal anterior posterior gradient noted. Background was well organized, continuous and fairly symmetric with no focal slowing.  During drowsiness there was gradual decrease in background frequency noted. Sleep was not seen during this recording.    There were occasional muscle and blinking artifacts noted.  Hyperventilation resulted in mild intermittant slowing of the background activity to delta range activity. Photic stimulation using stepwise increase in photic frequency resulted in bilateral symmetric driving response.  Throughout the recording there were no focal or generalized epileptiform activities in the form of spikes or sharps noted. There were no transient rhythmic activities or electrographic seizures noted.  One lead EKG rhythm strip revealed sinus rhythm at a rate of 90 bpm.  Impression: This is a abnormal record with the patient in awake and drowsy states due to generalized slowing.  No evidence of decreased seizure threshold, however does not rule out epilepsy.  Clinical correlation advised.   Carylon Perches MD MPH

## 2022-07-08 ENCOUNTER — Telehealth (INDEPENDENT_AMBULATORY_CARE_PROVIDER_SITE_OTHER): Payer: Self-pay | Admitting: Family

## 2022-07-08 NOTE — Telephone Encounter (Signed)
General Seizure Questions   Ask frequency of seizures - number in a day, week, month, etc. Ask when last seizure occurred.   - Patient's last seizure was 07/03/2022.   Ask to describe seizures - if caller says "usual seizures", get description anyway.   - Dad states that this seizure lasted for about 10 seconds. Patient was jerking, was tense and was making a gulping noise.   Ask about seizure medications - verify dose, type, frequency, compliance. Ask about side effects.   - Dad states that the patient has been taking his medication as prescribed.    Ask if the patient has been sick, under undue stress, has missed sleep.   - Dad states that the patient has been eating a lot but, good. Not under any stress.    If the caller reports a rash, ask when the med was started, if any other meds were given at the same time, any different foods, detergents, lotions, etc. Get description of rash, along with any other symptoms with the rash (nausea, vomiting, diarrhea, etc). Sometimes best to have patient stop by to look at rash if parents have difficulty describing or are unreliable in description.   - Dad doesn't report any rashes or changes in foods, just diet. Dad did state that he checked with patients mom, she did not use any different detergents when washing clothes.

## 2022-07-08 NOTE — Telephone Encounter (Signed)
I called and spoke with Dad. I explained that the EEG report did not show seizure activity, but that because Jaking continues to have seizures, that we need to make a change in his seizure medications. I asked him to come in on Monday November 6th to sign a [employer] patient assistance program form and I will apply for him to receive Oxtellar XR. We will plan to get the Oxtellar started, then will reduce or eliminate one of his other medications. Dad agreed with this plan. TG

## 2022-07-08 NOTE — Telephone Encounter (Signed)
See phone note from today. TG

## 2022-07-08 NOTE — Telephone Encounter (Signed)
  Name of who is calling: [place]  Caller's Relationship to Patient: Dad  Best contact number: [phone]  Provider they see: Rockwell Germany  Reason for call: Dad is calling to report that Elex had a seizure this morning. Dad is requesting a callback.      PRESCRIPTION REFILL ONLY  Name of prescription:  Pharmacy:

## 2022-07-17 NOTE — Progress Notes (Signed)
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 He is taking and tolerating Lamictal XR that he receives from [employer] Patient Assistance Program. He also receives Fycompa from the [employer] Patient Assistance Program and [place] from [facility] Patient Assistance Program.   Today's concerns: Franklin [name] returns today because of ongoing seizure activity. He has had 2 seizures since his last visit. He had an EEG on 07/03/2022 that revealed abnormal slowing. Franklin [name] has been compliant with medication and has been staying on a regular sleep schedule.  Franklin [name] has been otherwise generally healthy since he was last seen. Neither he nor his father have other health concerns for him today other than previously mentioned.  Review of systems: Please see HPI for neurologic and other pertinent review of systems. Otherwise all other systems were reviewed and were negative.  Problem List: Patient Active Problem List   Diagnosis Date Noted   Does not have health insurance 10/24/2021   Adjustment disorder with mixed anxiety and depressed mood 09/10/2021   Poor sleep 03/01/2021   Generalized anxiety disorder 04/10/2020   Loss of weight 04/10/2020   Seizure (HCC) 05/13/2019   Hypokalemia 05/12/2019   Hypomagnesemia 05/12/2019   Hypocalcemia 05/12/2019   Intellectual delay 03/10/2018   Personal history of prematurity 03/10/2018   Generalized convulsive epilepsy (HCC) 06/14/2013   Long-term use of high-risk medication 06/14/2013     Past Medical History:  Diagnosis Date   Prematurity    Retinopathy of prematurity    Seizures (HCC)     Past medical history  comments: See HPI Copied from previous record: When he was in school he was diagnosed 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 He was tried on Concerta which caused significant emotional disturbance, then on Ritalin, then Adderall which worked well. He has mild hearing loss. 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   Birth History: He was born at [redacted] weeks gestation, weighing 1 lb 13 1/2 oz. He required ventilator support for sometime. He reportedly had no CNS hemorrhage. Growth and development was delayed.   Surgical history: Past Surgical History:  Procedure Laterality Date   Central line placement     As premature infant   REFRACTIVE SURGERY     For retinopathy of prematurity     Family history: family history includes Autism in his brother; Diabetes type II in his father; Hypercholesterolemia in his father; Hypertension in his father; Stroke in his paternal aunt and paternal grandmother.   Social history: Social History   Socioeconomic History   Marital status: Single    Spouse name: Not on file   Number of children: Not on file   Years of education: Not on file   Highest education level: Not on file  Occupational History   Not on file  Tobacco Use   Smoking status: Never    Passive exposure: Current (Cousin smokes in home)   Smokeless tobacco: Never  Substance and Sexual  Activity   Alcohol use: No   Drug use: No   Sexual activity: Not Currently  Other Topics Concern   Not on file  Social History Narrative   Parents are divorced. Franklin [name] lives with his father.   Social Determinants of Health   Financial Resource Strain: Low Risk  (05/12/2019)   Overall Financial Resource Strain (CARDIA)    Difficulty of Paying  Living Expenses: Not hard at all  Food Insecurity: Food Insecurity Present (05/12/2019)   Hunger Vital Sign    Worried About Running Out of Food in the Last Year: Never true    [place] in the Last Year: Sometimes true  Transportation Needs: No Transportation Needs (05/12/2019)   PRAPARE - Hydrologist (Medical): No    Lack of Transportation (Non-Medical): No  Physical Activity: Sufficiently Active (05/12/2019)   Exercise Vital Sign    Days of Exercise per Week: 7 days    Minutes of Exercise per Session: 30 min  Stress: Stress Concern Present (05/12/2019)   Drew    Feeling of Stress : To some extent  Social Connections: Socially Isolated (05/12/2019)   Social Connection and Isolation Panel [NHANES]    Frequency of Communication with Friends and Family: Once a week    Frequency of Social Gatherings with Friends and Family: Once a week    Attends Religious Services: Never    Marine scientist or Organizations: No    Attends Archivist Meetings: Never    Marital Status: Never married  Intimate Partner Violence: Not At Risk (05/12/2019)   Humiliation, Afraid, Rape, and Kick questionnaire    Fear of Current or Ex-Partner: No    Emotionally Abused: No    Physically Abused: No    Sexually Abused: No    Past/failed meds:  Allergies: Allergies  Allergen Reactions   Concerta [Methylphenidate] Other (See Comments)    Would start crying for little things like when someone coughed or said a specific word for example.     Immunizations: Immunization History  Administered Date(s) Administered   PFIZER(Purple Top)SARS-COV-2 Vaccination 12/08/2019, 01/01/2020   Tdap 06/30/2015, 09/14/2018, 02/18/2022    Diagnostics/Screenings: Copied from previous record: 07/03/2022 - This is a abnormal record with the patient in awake and drowsy states due to generalized slowing.  No evidence  of decreased seizure threshold, however does not rule out epilepsy. Clinical correlation advised. Carylon Perches MD MPH  04/08/2022 - CT head wo contrast - No evidence of acute intracranial abnormality.   EEG in 2006 - generalized convulsive epilepsy  Physical Exam: BP 110/60   Pulse 82   Wt 127 lb 3.2 oz (57.7 kg)   BMI 18.78 kg/m   General: Thin but otherwise well developed, well nourished, seated, in no evident distress Head: Head normocephalic and atraumatic.  Oropharynx benign. Neck: Supple Cardiovascular: Regular rate and rhythm, no murmurs Respiratory: Breath sounds clear to auscultation Musculoskeletal: No obvious deformities or scoliosis Skin: No rashes or neurocutaneous lesions  Neurologic Exam Mental Status: Awake and fully alert.  Oriented to place and time. Fund of knowledge subnormal for age.  Mood and affect appropriate. Cranial Nerves: Fundoscopic exam reveals sharp disc margins.  Pupils equal, briskly reactive to light.  Extraocular movements full without nystagmus. Hearing intact and symmetric to whisper.  Facial sensation intact.  Face tongue, palate move normally and symmetrically. Shoulder shrug normal Motor: Normal bulk and tone. Normal strength in  all tested extremity muscles. Sensory: Intact to touch and temperature in all extremities.  Coordination: Rapid alternating movements normal in all extremities.  Finger-to-nose and heel-to shin performed accurately bilaterally.  Romberg negative. Gait and Station: Arises from chair without difficulty.  Stance is normal. Gait demonstrates normal stride length and balance.   Able to heel, toe and tandem walk without difficulty. Reflexes: 1+ and symmetric. Toes downgoing.   Impression: Generalized convulsive epilepsy ([place]) - Plan: OXTELLAR XR 150 MG TB24  Intellectual delay  Does not have health insurance   Recommendations for plan of care: The patient's previous Epic records were reviewed. Jahi has neither had  nor required imaging or lab studies since the last visit. He had an EEG and I reviewed the results with Peniel and his father. I talked with them about the seizures, and recommended that we taper the Lamictal dose as it is a large dose and seizures are still occurring. I recommended that he stop the morning dose of Lamictal XR 100mg  and start Oxtellar XR 150mg   1 capsule at night. I also gave him an application for [employer] Patient Assistance to supply the Oxtellar XR and will continue to work on his medication regimen. Dad reports that he was told that the [facility] Planning Medicaid that Muhammad has now will be changed to regular Medicaid coverage as of December 1st. If that occurs, I will plan to do further work up. Bryor and his father agreed with the plans made today.   The medication list was reviewed and reconciled. I reviewed the changes that were made in the prescribed medications today. A complete medication list was provided to the patient.  Return in about 3 months (around 10/20/2022).   Allergies as of 07/20/2022       Reactions   Concerta [methylphenidate] Other (See Comments)   Would start crying for little things like when someone coughed or said a specific word for example.         Medication List        Accurate as of July 20, 2022 11:59 PM. If you have any questions, ask your nurse or doctor.          acetaminophen 325 MG tablet Commonly known as: TYLENOL Take 2 tablets (650 mg total) by mouth every 4 (four) hours as needed for mild pain (or temp >/= 99.5).   aspirin EC 325 MG tablet Take 325 mg by mouth daily.   Briviact 100 MG Tabs tablet Generic drug: brivaracetam Take 1 tablet twice per day. This is in addition to Briviact 50mg  twice per day for total of 150mg  twice per day.   Briviact 50 MG Tabs Generic drug: Brivaracetam Take 1 tablet twice per day. This is in addition to Briviact 100mg  twice per day for total of 150mg  twice per day.   Fycompa 8 MG  tablet Generic drug: perampanel Take 1 tablet at bedtime   LaMICtal XR 100 MG 24 hour tablet Generic drug: lamoTRIgine Take 1 tablet every morning along with Lamictal XR 250mg    LaMICtal XR 200 MG Tb24 24 hour tablet Generic drug: LamoTRIgine Take 1 tablet at night along with a Lamictal XR 300mg  tablet   LaMICtal XR 250 MG Tb24 24 hour tablet Generic drug: LamoTRIgine Take 1 tablet in the morning along with Lamictal XR 100mg    LaMICtal XR 300 MG Tb24 24 hour tablet Generic drug: LamoTRIgine Take 1 tablet at night along with Lamictal XR 200mg    Oxtellar XR 150 MG Tb24 Generic drug:  OXcarbazepine ER Take 150 mg by mouth daily. Started by: Rockwell Germany, NP      I consulted with Dr Rogers Blocker regarding this patient.   Total time spent with the patient was 20 minutes, of which 50% or more was spent in counseling and coordination of care.  Rockwell Germany NP-C [facility] Ph. [phone] Fax [phone]

## 2022-07-20 ENCOUNTER — Encounter (INDEPENDENT_AMBULATORY_CARE_PROVIDER_SITE_OTHER): Payer: Self-pay | Admitting: Family

## 2022-07-20 ENCOUNTER — Ambulatory Visit (INDEPENDENT_AMBULATORY_CARE_PROVIDER_SITE_OTHER): Payer: Self-pay | Admitting: Family

## 2022-07-20 VITALS — BP 110/60 | HR 82 | Wt 127.2 lb

## 2022-07-20 DIAGNOSIS — G40909 Epilepsy, unspecified, not intractable, without status epilepticus: Secondary | ICD-10-CM

## 2022-07-20 DIAGNOSIS — F819 Developmental disorder of scholastic skills, unspecified: Secondary | ICD-10-CM

## 2022-07-20 DIAGNOSIS — G40309 Generalized idiopathic epilepsy and epileptic syndromes, not intractable, without status epilepticus: Secondary | ICD-10-CM

## 2022-07-20 DIAGNOSIS — Z5989 Other problems related to housing and economic circumstances: Secondary | ICD-10-CM

## 2022-07-21 ENCOUNTER — Telehealth (INDEPENDENT_AMBULATORY_CARE_PROVIDER_SITE_OTHER): Payer: Self-pay

## 2022-07-21 NOTE — Telephone Encounter (Signed)
I called and spoke with the [employer] Patient Assistance. They will not process the application until the second place on the form has been signed. I called and left a message for [name] asking him to bring Franklin [name] in to sign the form. TG

## 2022-07-21 NOTE — Telephone Encounter (Signed)
  Name of who is calling:Hannah   Caller's Relationship to Patient:[employer] Assistance program   Best contact number:  Provider they JKD:TOIZ Goodpasture   Reason for call:caller stated that she is in the middle of processing the application and missing the second signature. Caller asked if a second signature could be obtained and faxed back to [phone]      PRESCRIPTION REFILL ONLY  Name of prescription:  Pharmacy:

## 2022-07-22 MED ORDER — OXTELLAR XR 150 MG PO TB24: 150.0000 mg | ORAL_TABLET | Freq: Every day | ORAL | 0 refills | Status: DC

## 2022-07-22 NOTE — Patient Instructions (Signed)
It was a pleasure to see you today!  Instructions for you until your next appointment are as follows: Stop taking Lamictal XR 100mg  every morning Start taking Oxtellar XR 150mg  - 1 capsule at night I will send in the application for the Oxtellar XR patient assistance program Please sign up for MyChart if you have not done so. Please plan to return for follow up in 3 months or sooner if needed.  Feel free to contact our office during normal business hours at [phone] with questions or concerns. If there is no answer or the call is outside business hours, please leave a message and our clinic staff will call you back within the next business day.  If you have an urgent concern, please stay on the line for our after-hours answering service and ask for the on-call neurologist.     I also encourage you to use MyChart to communicate with me more directly. If you have not yet signed up for MyChart within [facility], the front desk staff can help you. However, please note that this inbox is NOT monitored on nights or weekends, and response can take up to 2 business days.  Urgent matters should be discussed with the on-call pediatric neurologist.   At [facility], we are committed to providing exceptional care. You will receive a patient satisfaction survey through text or email regarding your visit today. Your opinion is important to me. Comments are appreciated.

## 2022-07-23 NOTE — Telephone Encounter (Signed)
Franklin [name] came in and signed the document today. I refaxed the document to [employer] as requested. TG

## 2022-07-24 ENCOUNTER — Telehealth (INDEPENDENT_AMBULATORY_CARE_PROVIDER_SITE_OTHER): Payer: Self-pay | Admitting: Family

## 2022-07-24 ENCOUNTER — Encounter (INDEPENDENT_AMBULATORY_CARE_PROVIDER_SITE_OTHER): Payer: Self-pay | Admitting: Family

## 2022-07-24 ENCOUNTER — Telehealth (INDEPENDENT_AMBULATORY_CARE_PROVIDER_SITE_OTHER): Payer: Self-pay

## 2022-07-24 NOTE — Telephone Encounter (Signed)
  Name of who is calling: Roger Shelter Relationship to Patient: dad   Best contact number: [phone]  Provider they see: Elveria Rising  Reason for call: Dad is calling to let you know Echo had a seizure. This one happened in his sleep. He was jerking a little and tensing up. He made the noise like he should be foaming out of his mouth but he didn't.

## 2022-07-27 MED ORDER — FYCOMPA 4 MG PO TABS
4.0000 mg | ORAL_TABLET | Freq: Every day | ORAL | 0 refills | Status: DC
Start: 2022-07-27 — End: 2022-08-17

## 2022-07-27 NOTE — Telephone Encounter (Signed)
I received a message that [employer] had approved the Oxtellar XR patient assistance program. I called Franklin [name] to see if he had received the medication so that I can make further adjustments in his medication dosages. He has not received the new Oxtellar XR shipment. I will call him again tomorrow. TG

## 2022-07-28 NOTE — Telephone Encounter (Signed)
I called [employer] Patient [employer]. The Oxtellar XR has been shipped but according to tracking has not been delivered. I will call Koa later this week to see if he has received the medication. TG

## 2022-07-29 ENCOUNTER — Telehealth (INDEPENDENT_AMBULATORY_CARE_PROVIDER_SITE_OTHER): Payer: Self-pay | Admitting: Family

## 2022-07-29 NOTE — Telephone Encounter (Signed)
Dad is calling to let you know Franklin [name] got a letter in the mail which stated he will start receiving full medicaid December 1st.

## 2022-08-04 NOTE — Telephone Encounter (Signed)
I called and spoke with Dad. He said that [facility] tested positive for Covid last week but that he was cleared with a negative test today. He was not at home at the time of my call. I told Dad that I wanted to talk with Cheyne about his medicine and will call back tomorrow. He agreed with this plan. TG

## 2022-08-07 NOTE — Telephone Encounter (Signed)
I called Franklin [name] and scheduled him for return visit on Thursday December 7th. I will plan to draw labs that day. TG

## 2022-08-12 NOTE — Progress Notes (Signed)
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 He is taking and tolerating Lamictal XR that he receives from [name]. He also receives Fycompa from the [name] and [place] from Franklin [name].    Today's concerns: Seizures continue to occur every few weeks. Last seizure occurred on Monday December 4th Is compliant with medication Now has [employer]  Franklin [name] has been otherwise generally healthy since he was last seen. No health concerns today other than previously mentioned.  Review of systems: Please see HPI for neurologic and other pertinent review of systems. Otherwise all other systems were reviewed and were negative.  Problem List: Patient Active Problem List   Diagnosis Date Noted   Does not have health insurance 10/24/2021   Adjustment disorder with mixed anxiety and depressed mood 09/10/2021   Poor sleep 03/01/2021   Generalized anxiety disorder 04/10/2020   Loss of weight 04/10/2020   Seizure ([place]) 05/13/2019   Hypokalemia 05/12/2019   Hypomagnesemia 05/12/2019   Hypocalcemia 05/12/2019   Intellectual delay 03/10/2018   Personal history of prematurity 03/10/2018   Generalized convulsive epilepsy ([place]) 06/14/2013   Long-term use of high-risk medication 06/14/2013     Past Medical History:  Diagnosis Date   Prematurity    Retinopathy of prematurity    Seizures ([place])     Past medical history comments: See HPI Copied from previous record: When he was in school he was diagnosed with attention deficit disorder and central auditory processing deficit by Dr Franklin [name].  He was tried on Concerta which caused significant emotional disturbance, then on Ritalin, then Adderall which worked well. He has mild hearing loss. 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   Birth History: He was born at [redacted] weeks gestation, weighing 1 lb 13 1/2 oz. He required ventilator support for sometime. He reportedly had no CNS hemorrhage. Growth and development was delayed.    Surgical history: Past Surgical History:  Procedure Laterality Date   Central line placement     As premature infant   REFRACTIVE SURGERY     For retinopathy of prematurity     Family history: family history includes Autism in his brother; Diabetes type II in his father; Hypercholesterolemia in his father; Hypertension in his father; Stroke in his paternal aunt and paternal grandmother.   Social history: Social History   Socioeconomic History   Marital status: Single    Spouse name: Not on file   Number of children: Not on file   Years of education: Not on file   Highest education level: Not on file  Occupational History   Not on file  Tobacco Use   Smoking status: Never    Passive exposure: Current (Cousin smokes in home)   Smokeless tobacco: Never  Substance and Sexual Activity   Alcohol use: No   Drug use: No   Sexual activity: Not Currently  Other Topics Concern   Not on file  Social History  Narrative   Parents are divorced. [name] lives with his father.   Social Determinants of Health   Financial Resource Strain: Low Risk  (05/12/2019)   Overall Financial Resource Strain (CARDIA)    Difficulty of Paying Living Expenses: Not hard at all  Food Insecurity: Food Insecurity Present (05/12/2019)   Hunger Vital Sign    Worried About Running Out of Food in the Last Year: Never true     Ran Out of Food in the Last Year: Sometimes true  Transportation Needs: No Transportation Needs (05/12/2019)   PRAPARE - Administrator, Civil Service (Medical): No    Lack of Transportation (Non-Medical): No  Physical Activity: Sufficiently Active (05/12/2019)   Exercise Vital Sign    Days of Exercise per Week: 7 days    Minutes of Exercise per Session: 30 min  Stress: Stress Concern Present (05/12/2019)   [name] of Occupational Health - Occupational Stress Questionnaire    Feeling of Stress : To some extent  Social Connections: Socially Isolated (05/12/2019)   Social Connection and Isolation Panel [NHANES]    Frequency of Communication with Friends and Family: Once a week    Frequency of Social Gatherings with Friends and Family: Once a week    Attends Religious Services: Never    Database administrator or Organizations: No    Attends Banker Meetings: Never    Marital Status: Never married  Intimate Partner Violence: Not At Risk (05/12/2019)   Humiliation, Afraid, Rape, and Kick questionnaire    Fear of Current or Ex-Partner: No    Emotionally Abused: No    Physically Abused: No    Sexually Abused: No    Past/failed meds:  Allergies: Allergies  Allergen Reactions   Concerta [Methylphenidate] Other (See Comments)    Would start crying for little things like when someone coughed or said a specific word for example.     Immunizations: Immunization History  Administered Date(s) Administered   PFIZER(Purple Top)SARS-COV-2 Vaccination 12/08/2019, 01/01/2020   Tdap 06/30/2015, 09/14/2018, 02/18/2022    Diagnostics/Screenings: Copied from previous record: 07/03/2022 - This is a abnormal record with the patient in awake and drowsy states due to generalized slowing.  No evidence of decreased seizure threshold, however does not rule out epilepsy. Clinical correlation advised. Lorenz Coaster MD MPH   04/08/2022 - CT head wo contrast - No evidence of  acute intracranial abnormality.   EEG in 2006 - generalized convulsive epilepsy  Physical Exam: BP 118/60 (BP Location: Left Arm, Patient Position: Sitting, Cuff Size: Normal)   Pulse 81   Ht 5' 8.27" (1.734 m)   Wt 132 lb 9.6 oz (60.1 kg)   BMI 20.00 kg/m   General: Thin but well developed, well nourished young man, seated on exam table, in no evident distress Head: Head normocephalic and atraumatic.  Oropharynx benign. Neck: Supple Cardiovascular: Regular rate and rhythm, no murmurs Respiratory: Breath sounds clear to auscultation Musculoskeletal: No obvious deformities or scoliosis Skin: No rashes or neurocutaneous lesions  Neurologic Exam Mental Status: Awake and fully alert.  Oriented to place and time. Fund of knowledge subnormal for age.  Mood and affect appropriate. Cranial Nerves: Fundoscopic exam reveals sharp disc margins.  Pupils equal, briskly reactive to light.  Extraocular movements full without nystagmus. Hearing intact and symmetric to whisper.  Facial sensation intact.  Face tongue, palate move normally and symmetrically. Shoulder shrug normal Motor: Normal bulk and tone. Normal strength in all tested extremity muscles. Sensory: Intact to  touch and temperature in all extremities.  Coordination: Rapid alternating movements normal in all extremities.  Finger-to-nose and heel-to shin performed accurately bilaterally.  Romberg negative. Gait and Station: Arises from chair without difficulty.  Stance is normal. Gait demonstrates normal stride length and balance.   Able to heel, toe and tandem walk without difficulty. Reflexes: 1+ and symmetric. Toes downgoing.   Impression: Generalized convulsive epilepsy (HCC) - Plan: CBC with Differential/Platelet, Comprehensive metabolic panel, Lamotrigine level, 10-Hydroxycarbazepine, MR BRAIN WO CONTRAST, 10-Hydroxycarbazepine, Lamotrigine level, Comprehensive metabolic panel, CBC with Differential/Platelet  Long-term use of high-risk  medication - Plan: CBC with Differential/Platelet, Comprehensive metabolic panel, Lamotrigine level, 10-Hydroxycarbazepine, 10-Hydroxycarbazepine, Lamotrigine level, Comprehensive metabolic panel, CBC with Differential/Platelet  Focal epilepsy (HCC) - Plan: CBC with Differential/Platelet, Comprehensive metabolic panel, Lamotrigine level, 10-Hydroxycarbazepine, MR BRAIN WO CONTRAST, 10-Hydroxycarbazepine, Lamotrigine level, Comprehensive metabolic panel, CBC with Differential/Platelet  EEG abnormal - Plan: MR BRAIN WO CONTRAST  Elevated alkaline phosphatase level - Plan: CBC with Differential/Platelet, Comprehensive metabolic panel, Lamotrigine level, 10-Hydroxycarbazepine, 10-Hydroxycarbazepine, Lamotrigine level, Comprehensive metabolic panel, CBC with Differential/Platelet   Recommendations for plan of care: The patient's previous Epic records were reviewed. No recent diagnostic studies. I am concerned about a mass or lesion as an etiology for seizures and talked with Zylon about more work up now that he has insurance. Once results are received, we can also consider other treatment options, such as other medications and VNS therapy.  Plan until next visit: Continue medications as prescribed Call me with Oxtellar XR dose that you have been taking at home  Labs were ordered today. I will call patient when I receive results MRI was ordered today. I will call patient when I receive the results I will arrange follow up when I receive the test results  The medication list was reviewed and reconciled. No changes were made in the prescribed medications today. A complete medication list was provided to the patient.  Orders Placed This Encounter  Procedures   MR BRAIN WO CONTRAST    Patient with increase in seizure activity despite compliance with medication. Has focal epilepsy on EEG. Perform MRI brain to evaluate for mass or lesion    Standing Status:   Future    Standing Expiration Date:    08/14/2023    Order Specific Question:   What is the patient's sedation requirement?    Answer:   No Sedation    Order Specific Question:   Does the patient have a pacemaker or implanted devices?    Answer:   No    Order Specific Question:   Preferred imaging location?    Answer:   [facility] (table limit - 500 lbs)   CBC with Differential/Platelet    Standing Status:   Future    Number of Occurrences:   1    Standing Expiration Date:   08/14/2023   Comprehensive metabolic panel    Standing Status:   Future    Number of Occurrences:   1    Standing Expiration Date:   08/14/2023    Order Specific Question:   Has the patient fasted?    Answer:   No   Lamotrigine level    Random level    Standing Status:   Future    Number of Occurrences:   1    Standing Expiration Date:   10/12/2022   10-Hydroxycarbazepine    Random level    Standing Status:   Future    Number of Occurrences:   1  Standing Expiration Date:   10/05/2022     Allergies as of 08/13/2022       Reactions   Concerta [methylphenidate] Other (See Comments)   Would start crying for little things like when someone coughed or said a specific word for example.         Medication List        Accurate as of August 13, 2022 11:59 PM. If you have any questions, ask your nurse or doctor.          acetaminophen 325 MG tablet Commonly known as: TYLENOL Take 2 tablets (650 mg total) by mouth every 4 (four) hours as needed for mild pain (or temp >/= 99.5).   aspirin EC 325 MG tablet Take 325 mg by mouth daily.   Briviact 100 MG Tabs tablet Generic drug: brivaracetam Take 1 tablet twice per day. This is in addition to Briviact 50mg  twice per day for total of 150mg  twice per day.   Briviact 50 MG Tabs Generic drug: Brivaracetam Take 1 tablet twice per day. This is in addition to Briviact 100mg  twice per day for total of 150mg  twice per day.   Fycompa 8 MG tablet Generic drug: perampanel Take 1 tablet at  bedtime   Fycompa 4 MG Tabs Generic drug: Perampanel Take 1 tablet (4 mg total) by mouth at bedtime.   LaMICtal XR 200 MG Tb24 24 hour tablet Generic drug: LamoTRIgine Take 1 tablet at night along with a Lamictal XR 300mg  tablet What changed: Another medication with the same name was removed. Continue taking this medication, and follow the directions you see here. Changed by: Rockwell Germany, NP   LaMICtal XR 250 MG Tb24 24 hour tablet Generic drug: LamoTRIgine Take 1 tablet in the morning along with Lamictal XR 100mg  What changed: Another medication with the same name was removed. Continue taking this medication, and follow the directions you see here. Changed by: Rockwell Germany, NP   LaMICtal XR 300 MG Tb24 24 hour tablet Generic drug: LamoTRIgine Take 1 tablet at night along with Lamictal XR 200mg  What changed: Another medication with the same name was removed. Continue taking this medication, and follow the directions you see here. Changed by: Rockwell Germany, NP   Oxtellar XR 150 MG Tb24 Generic drug: OXcarbazepine ER Take 150 mg by mouth daily.      Total time spent with the patient was 25 minutes, of which 50% or more was spent in counseling and coordination of care.  Rockwell Germany NP-C [facility] [id number], [place], [place] [address] Ph. [phone] Fax [phone]

## 2022-08-13 ENCOUNTER — Encounter (INDEPENDENT_AMBULATORY_CARE_PROVIDER_SITE_OTHER): Payer: Self-pay | Admitting: Family

## 2022-08-13 ENCOUNTER — Ambulatory Visit (INDEPENDENT_AMBULATORY_CARE_PROVIDER_SITE_OTHER): Payer: Medicaid Other | Admitting: Family

## 2022-08-13 VITALS — BP 118/60 | HR 81 | Ht 68.27 in | Wt 132.6 lb

## 2022-08-13 DIAGNOSIS — Z79899 Other long term (current) drug therapy: Secondary | ICD-10-CM | POA: Diagnosis not present

## 2022-08-13 DIAGNOSIS — G40109 Localization-related (focal) (partial) symptomatic epilepsy and epileptic syndromes with simple partial seizures, not intractable, without status epilepticus: Secondary | ICD-10-CM

## 2022-08-13 DIAGNOSIS — G40309 Generalized idiopathic epilepsy and epileptic syndromes, not intractable, without status epilepticus: Secondary | ICD-10-CM

## 2022-08-13 DIAGNOSIS — G40409 Other generalized epilepsy and epileptic syndromes, not intractable, without status epilepticus: Secondary | ICD-10-CM | POA: Diagnosis not present

## 2022-08-13 DIAGNOSIS — R748 Abnormal levels of other serum enzymes: Secondary | ICD-10-CM

## 2022-08-13 DIAGNOSIS — R9401 Abnormal electroencephalogram [EEG]: Secondary | ICD-10-CM | POA: Diagnosis not present

## 2022-08-13 NOTE — Patient Instructions (Signed)
It was a pleasure to see you today!  Instructions for you until your next appointment are as follows: We will draw blood today. I will call you next week when I receive the results I have ordered an MRI of the brain. After we get approval from insurance to do the test, you will receive a call from the hospital to schedule it. I will call you when I receive the results When you get home today, check the Oxtellar XR tablets. Please call or text and let me know the mg strength and how often you are taking them.  Continue all your medications as prescribed for now.  Please sign up for MyChart if you have not done so. I will schedule a follow up appointment after I get the test results.   Feel free to contact our office during normal business hours at [phone] with questions or concerns. If there is no answer or the call is outside business hours, please leave a message and our clinic staff will call you back within the next business day.  If you have an urgent concern, please stay on the line for our after-hours answering service and ask for the on-call neurologist.     I also encourage you to use MyChart to communicate with me more directly. If you have not yet signed up for MyChart within [facility], the front desk staff can help you. However, please note that this inbox is NOT monitored on nights or weekends, and response can take up to 2 business days.  Urgent matters should be discussed with the on-call pediatric neurologist.   At [facility], we are committed to providing exceptional care. You will receive a patient satisfaction survey through text or email regarding your visit today. Your opinion is important to me. Comments are appreciated.

## 2022-08-15 ENCOUNTER — Encounter (INDEPENDENT_AMBULATORY_CARE_PROVIDER_SITE_OTHER): Payer: Self-pay | Admitting: Family

## 2022-08-16 LAB — CBC WITH DIFFERENTIAL/PLATELET
Absolute Monocytes: 863 cells/uL (ref 200–950)
Basophils Absolute: 89 cells/uL (ref 0–200)
Basophils Relative: 1 %
Eosinophils Absolute: 276 cells/uL (ref 15–500)
Eosinophils Relative: 3.1 %
HCT: 40.8 % (ref 38.5–50.0)
Hemoglobin: 14.2 g/dL (ref 13.2–17.1)
Lymphs Abs: 2608 cells/uL (ref 850–3900)
MCH: 30.7 pg (ref 27.0–33.0)
MCHC: 34.8 g/dL (ref 32.0–36.0)
MCV: 88.3 fL (ref 80.0–100.0)
MPV: 9.5 fL (ref 7.5–12.5)
Monocytes Relative: 9.7 %
Neutro Abs: 5064 cells/uL (ref 1500–7800)
Neutrophils Relative %: 56.9 %
Platelets: 578 10*3/uL — ABNORMAL HIGH (ref 140–400)
RBC: 4.62 10*6/uL (ref 4.20–5.80)
RDW: 12.1 % (ref 11.0–15.0)
Total Lymphocyte: 29.3 %
WBC: 8.9 10*3/uL (ref 3.8–10.8)

## 2022-08-16 LAB — COMPREHENSIVE METABOLIC PANEL
AG Ratio: 1.6 (calc) (ref 1.0–2.5)
ALT: 45 U/L (ref 9–46)
AST: 33 U/L (ref 10–40)
Albumin: 4.5 g/dL (ref 3.6–5.1)
Alkaline phosphatase (APISO): 355 U/L — ABNORMAL HIGH (ref 36–130)
BUN: 9 mg/dL (ref 7–25)
CO2: 29 mmol/L (ref 20–32)
Calcium: 9.1 mg/dL (ref 8.6–10.3)
Chloride: 98 mmol/L (ref 98–110)
Creat: 0.73 mg/dL (ref 0.60–1.24)
Globulin: 2.9 g/dL (calc) (ref 1.9–3.7)
Glucose, Bld: 80 mg/dL (ref 65–139)
Potassium: 3.6 mmol/L (ref 3.5–5.3)
Sodium: 137 mmol/L (ref 135–146)
Total Bilirubin: 0.4 mg/dL (ref 0.2–1.2)
Total Protein: 7.4 g/dL (ref 6.1–8.1)

## 2022-08-16 LAB — LAMOTRIGINE LEVEL: Lamotrigine Lvl: 14 ug/mL (ref 2.5–15.0)

## 2022-08-16 LAB — 10-HYDROXYCARBAZEPINE: Triliptal/MTB(Oxcarbazepin): 8.2 ug/mL (ref 8.0–35.0)

## 2022-08-17 ENCOUNTER — Telehealth (INDEPENDENT_AMBULATORY_CARE_PROVIDER_SITE_OTHER): Payer: Self-pay | Admitting: Family

## 2022-08-17 DIAGNOSIS — Z6820 Body mass index (BMI) 20.0-20.9, adult: Secondary | ICD-10-CM

## 2022-08-17 DIAGNOSIS — G40109 Localization-related (focal) (partial) symptomatic epilepsy and epileptic syndromes with simple partial seizures, not intractable, without status epilepticus: Secondary | ICD-10-CM

## 2022-08-17 DIAGNOSIS — Z79899 Other long term (current) drug therapy: Secondary | ICD-10-CM

## 2022-08-17 DIAGNOSIS — G40309 Generalized idiopathic epilepsy and epileptic syndromes, not intractable, without status epilepticus: Secondary | ICD-10-CM

## 2022-08-17 DIAGNOSIS — R748 Abnormal levels of other serum enzymes: Secondary | ICD-10-CM | POA: Insufficient documentation

## 2022-08-17 DIAGNOSIS — R634 Abnormal weight loss: Secondary | ICD-10-CM

## 2022-08-17 MED ORDER — OXTELLAR XR 600 MG PO TB24: ORAL_TABLET | ORAL | 5 refills | Status: DC

## 2022-08-17 NOTE — Telephone Encounter (Signed)
I called and spoke with Dad about the blood test results. I explained that the elevated alkaline phosphatase warranted an additional blood test and asked him to bring Rito in Thursday to have blood drawn. He agreed with this plan. TG

## 2022-08-17 NOTE — Telephone Encounter (Signed)
I called Franklin [name] to follow up on his medication as it was not clear to me what doses he has been taking. His father agreed to bring Franklin [name] and his medication bottles in for me to review. When they arrived, Franklin [name] had several bottles of medication along with about an 8 oz cup size of tablets loose in a box. I reviewed the medication with him and learned that he has not been taking Fycompa so I removed that from his regimen. I verified and updated the other doses. I asked him to take the medications as prescribed for a week, and then to return with his medication again so that we can continue to simplify his regimen. Franklin [name] and his father agreed with the plans made today. TG

## 2022-08-18 ENCOUNTER — Telehealth (INDEPENDENT_AMBULATORY_CARE_PROVIDER_SITE_OTHER): Payer: Self-pay

## 2022-08-18 NOTE — Telephone Encounter (Signed)
Contacted pt's father.  Verified pt's name and DOB as well as dad's name.  I asked dad if Arpan has another insurance card. Dad states that Wynn has [place] [employer]/Medicaid but, has not received his card in the mail yet.  I asked dad to contact us as soon as he receives his card.   SS, CCMA

## 2022-08-21 ENCOUNTER — Telehealth (INDEPENDENT_AMBULATORY_CARE_PROVIDER_SITE_OTHER): Payer: Self-pay | Admitting: Family

## 2022-08-21 DIAGNOSIS — E559 Vitamin D deficiency, unspecified: Secondary | ICD-10-CM

## 2022-08-21 LAB — PARATHYROID HORMONE, INTACT (NO CA): PTH: 60 pg/mL (ref 16–77)

## 2022-08-21 LAB — PHOSPHORUS: Phosphorus: 3 mg/dL (ref 2.5–4.5)

## 2022-08-21 LAB — MAGNESIUM: Magnesium: 2.1 mg/dL (ref 1.5–2.5)

## 2022-08-21 LAB — VITAMIN D 25 HYDROXY (VIT D DEFICIENCY, FRACTURES): Vit D, 25-Hydroxy: 7 ng/mL — ABNORMAL LOW (ref 30–100)

## 2022-08-21 LAB — PREALBUMIN: Prealbumin: 27 mg/dL (ref 21–43)

## 2022-08-21 MED ORDER — VITAMIN D (ERGOCALCIFEROL) 1.25 MG (50000 UNIT) PO CAPS: 50000.0000 [IU] | ORAL_CAPSULE | ORAL | 2 refills | Status: DC

## 2022-08-21 NOTE — Telephone Encounter (Signed)
I called Franklin [name] and his father with the lab results showing Vitamin D deficiency and recommended treatment for 12 weeks with ergocalciferol. They agreed with this plan. TG

## 2022-09-02 ENCOUNTER — Telehealth (INDEPENDENT_AMBULATORY_CARE_PROVIDER_SITE_OTHER): Payer: Self-pay | Admitting: Family

## 2022-09-02 NOTE — Telephone Encounter (Signed)
Who's calling (name and relationship to patient) : Earlie Schank; dad  Best contact number: [phone]  Provider they see: Elveria Rising, NP  Reason for call: Dad was calling in wanting to let Inetta Fermo know that Dagmawi was dizzy and had to lay down Christmas evening. Dad stated that there was no seizures that he is aware of..   Call ID:      PRESCRIPTION REFILL ONLY  Name of prescription:  Pharmacy:

## 2022-09-03 NOTE — Telephone Encounter (Signed)
Call to dad [name]- requested he call back with the following info. How long did the dizziness last? Any other symp such as fever, congestion or not drinking enough fluids?  How has he been since then?

## 2022-09-12 ENCOUNTER — Ambulatory Visit (HOSPITAL_COMMUNITY)
Admission: RE | Admit: 2022-09-12 | Discharge: 2022-09-12 | Disposition: A | Discharge status: Home / Self Care | Payer: Medicaid Other | Source: Ambulatory Visit | Attending: Family | Admitting: Family

## 2022-09-12 DIAGNOSIS — G40109 Localization-related (focal) (partial) symptomatic epilepsy and epileptic syndromes with simple partial seizures, not intractable, without status epilepticus: Secondary | ICD-10-CM | POA: Diagnosis present

## 2022-09-12 DIAGNOSIS — R9401 Abnormal electroencephalogram [EEG]: Secondary | ICD-10-CM | POA: Diagnosis present

## 2022-09-12 DIAGNOSIS — G40309 Generalized idiopathic epilepsy and epileptic syndromes, not intractable, without status epilepticus: Secondary | ICD-10-CM | POA: Diagnosis present

## 2022-09-14 ENCOUNTER — Telehealth (INDEPENDENT_AMBULATORY_CARE_PROVIDER_SITE_OTHER): Payer: Self-pay | Admitting: Family

## 2022-09-14 NOTE — Telephone Encounter (Signed)
Attempted to contact pt's dad. Unable to be reached. LVM to call back.  Note has been printed and placed up front in the folder.  SS, CCMA

## 2022-09-14 NOTE — Telephone Encounter (Signed)
I have written a letter for Franklin [name] to take to DSS. Please print it and let Dad know when he can pick it up. Thanks, Otila Kluver

## 2022-09-14 NOTE — Telephone Encounter (Signed)
  Name of who is calling: Lysbeth Galas Relationship to Patient: Dad  Best contact number: [phone]  Provider they see: Rockwell Germany  Reason for call: Dad came in office and said  Daylyn needed a letter stating he can't work due to his epilepsy to to take to [place]. He need this in 2 days by 09/16/2022. Dad is requesting a callback.     PRESCRIPTION REFILL ONLY  Name of prescription:  Pharmacy:

## 2022-09-17 ENCOUNTER — Telehealth (INDEPENDENT_AMBULATORY_CARE_PROVIDER_SITE_OTHER): Payer: Self-pay | Admitting: Family

## 2022-09-17 NOTE — Telephone Encounter (Signed)
I called and told Dad that the letter had been written and is at the front desk for him to pick up. TG

## 2022-09-17 NOTE — Telephone Encounter (Signed)
  Name of who is calling: harvey  Caller's Relationship to Patient: dad  Best contact number: [phone]  Provider they see: Samul Dada  Reason for call: calling to find out if Otila Kluver got the letter that was needed for DSS for assistance. Please contact dad back     [place]  Name of prescription:  Pharmacy:

## 2022-09-23 ENCOUNTER — Ambulatory Visit (INDEPENDENT_AMBULATORY_CARE_PROVIDER_SITE_OTHER): Payer: Medicaid Other | Admitting: Family

## 2022-09-23 ENCOUNTER — Encounter (INDEPENDENT_AMBULATORY_CARE_PROVIDER_SITE_OTHER): Payer: Self-pay | Admitting: Family

## 2022-09-23 VITALS — BP 118/82 | HR 70 | Ht 68.31 in | Wt 134.0 lb

## 2022-09-23 DIAGNOSIS — F819 Developmental disorder of scholastic skills, unspecified: Secondary | ICD-10-CM

## 2022-09-23 DIAGNOSIS — G40309 Generalized idiopathic epilepsy and epileptic syndromes, not intractable, without status epilepticus: Secondary | ICD-10-CM

## 2022-09-23 DIAGNOSIS — Z87898 Personal history of other specified conditions: Secondary | ICD-10-CM | POA: Diagnosis not present

## 2022-09-23 DIAGNOSIS — R634 Abnormal weight loss: Secondary | ICD-10-CM

## 2022-09-23 DIAGNOSIS — E559 Vitamin D deficiency, unspecified: Secondary | ICD-10-CM

## 2022-09-23 DIAGNOSIS — G40109 Localization-related (focal) (partial) symptomatic epilepsy and epileptic syndromes with simple partial seizures, not intractable, without status epilepticus: Secondary | ICD-10-CM

## 2022-09-23 DIAGNOSIS — F411 Generalized anxiety disorder: Secondary | ICD-10-CM

## 2022-09-23 NOTE — Progress Notes (Signed)
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 He is taking and tolerating Lamictal XR, Oxtellar XR, and Briviact for his seizure disorder. He is taking Vitamin D3 for Vitamin D deficiency.     Today's concerns: Seizures continue, typically convulsive, in sleep about every 1-2 weeks Is compliant with medication. Fewer side effects since being off Fycompa  Has been seen by optometry and has new glasses Continues to be underweight. Says that he eats regularly now that he has food stamps Reports ongoing anxiety and is interested in seeing an [place] provider in this office Needs a PCP Interested in results of recent MRI of the brain Franklin [name] has been otherwise generally healthy since he was last seen. No health concerns today other than previously mentioned.  Review of systems: Please see HPI for neurologic and other pertinent review of systems. Otherwise all other systems were reviewed and were negative.  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   Body mass index (BMI) of 20.0-20.9 in adult 08/17/2022   Does not have health insurance 10/24/2021   Adjustment disorder with mixed anxiety and depressed mood 09/10/2021   Poor sleep 03/01/2021   Generalized anxiety disorder 04/10/2020   Loss of weight 04/10/2020   Seizure ([place]) 05/13/2019   Hypokalemia 05/12/2019   Hypomagnesemia 05/12/2019   Hypocalcemia 05/12/2019   Intellectual delay 03/10/2018   Personal history of prematurity 03/10/2018   Generalized  convulsive epilepsy ([place]) 06/14/2013   Long-term use of high-risk medication 06/14/2013     Past Medical History:  Diagnosis Date   Prematurity    Retinopathy of prematurity    Seizures ([place])     Past medical history comments: See HPI Copied from previous record: When he was in school he was diagnosed 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 He was tried on Concerta which caused significant emotional disturbance, then on Ritalin, then Adderall which worked well. He has mild hearing loss. 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   Birth History: He was born at [redacted] weeks gestation, weighing 1 lb 13 1/2 oz. He required ventilator support for sometime. He reportedly had no CNS hemorrhage. Growth and development was delayed.   Surgical history: Past Surgical History:  Procedure Laterality Date   Central line placement     As premature infant   REFRACTIVE SURGERY     For retinopathy of prematurity     Family history: family history includes Autism in his brother; Diabetes type II in his father; Hypercholesterolemia in his father; Hypertension in his father; Stroke in his paternal aunt and paternal grandmother.   Social history: Social History   Socioeconomic History   Marital status: Single    Spouse name: Not on file   Number of children: Not on file   Years of education: Not on file  Highest education level: Not on file  Occupational History   Not on file  Tobacco Use   Smoking status: Never    Passive exposure: Current (Cousin smokes in home)   Smokeless tobacco: Never  Substance and Sexual Activity   Alcohol use: No   Drug use: No   Sexual activity: Not Currently  Other Topics Concern   Not on file  Social History  Narrative   Parents are divorced. Lonzo lives with his father.   Social Determinants of Health   Financial Resource Strain: Low Risk  (05/12/2019)   Overall Financial Resource Strain (CARDIA)    Difficulty of Paying Living Expenses: Not hard at all  Food Insecurity: Food Insecurity Present (05/12/2019)   Hunger Vital Sign    Worried About Running Out of Food in the Last Year: Never true    Ran Out of Food in the Last Year: Sometimes true  Transportation Needs: No Transportation Needs (05/12/2019)   PRAPARE - Administrator, Civil Service (Medical): No    Lack of Transportation (Non-Medical): No  Physical Activity: Sufficiently Active (05/12/2019)   Exercise Vital Sign    Days of Exercise per Week: 7 days    Minutes of Exercise per Session: 30 min  Stress: Stress Concern Present (05/12/2019)   [name] of Occupational Health - Occupational Stress Questionnaire    Feeling of Stress : To some extent  Social Connections: Socially Isolated (05/12/2019)   Social Connection and Isolation Panel [NHANES]    Frequency of Communication with Friends and Family: Once a week    Frequency of Social Gatherings with Friends and Family: Once a week    Attends Religious Services: Never    Database administrator or Organizations: No    Attends Banker Meetings: Never    Marital Status: Never married  Intimate Partner Violence: Not At Risk (05/12/2019)   Humiliation, Afraid, Rape, and Kick questionnaire    Fear of Current or Ex-Partner: No    Emotionally Abused: No    Physically Abused: No    Sexually Abused: No    Past/failed meds: Copied from previous record: Levetiracetam - did not control seizures Fycompa - did not control seizures and made him dizzy Lamotrigine IR - dizziness  Allergies: Allergies  Allergen Reactions   Concerta [Methylphenidate] Other (See Comments)    Would start crying for little things like when someone coughed or said a specific word for  example.     Immunizations: Immunization History  Administered Date(s) Administered   PFIZER(Purple Top)SARS-COV-2 Vaccination 12/08/2019, 01/01/2020   Tdap 06/30/2015, 09/14/2018, 02/18/2022    Diagnostics/Screenings: Copied from previous record: 09/14/2022 MRI Brain wo contrast - 1. No acute brain finding. 2. 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 3. Question mild volume loss and increased FLAIR signal within the hippocampus on the left compared to the right. I am not sure this is definite, but could indicate subtle mesial temporal sclerosis on the left. Relative asymmetric prominence of the occipital horn of the left lateral ventricle but without evidence of definable focal abnormality of the left occipital lobe.  07/03/2022 - rEEG - This is a abnormal record with the patient in awake and drowsy states due to generalized slowing.  No evidence of decreased seizure threshold, however does not rule out epilepsy.  Clinical correlation advised.    Lorenz Coaster MD MPH  Physical Exam: BP 118/82 (BP  Location: Left Arm, Patient Position: Sitting, Cuff Size: Small)   Pulse 70   Ht 5' 8.31" (1.735 m)   Wt 134 lb (60.8 kg)   BMI 20.19 kg/m   Wt Readings from Last 3 Encounters:  09/23/22 134 lb (60.8 kg)  08/13/22 132 lb 9.6 oz (60.1 kg)  07/20/22 127 lb 3.2 oz (57.7 kg)    General: Thin but well developed, well nourished man, seated on exam table, in no evident distress Head: Head normocephalic and atraumatic.  Oropharynx benign. Neck: Supple Cardiovascular: Regular rate and rhythm, no murmurs Respiratory: Breath sounds clear to auscultation Musculoskeletal: No obvious deformities or scoliosis Skin: No rashes or neurocutaneous lesions  Neurologic Exam Mental Status: Awake and fully alert.  Oriented to place and time. Attention span, concentration, and fund of knowledge subnormal for  age.  Mood and affect appropriate. Cranial Nerves: Fundoscopic exam reveals sharp disc margins.  Pupils equal, briskly reactive to light.  Extraocular movements full without nystagmus. Hearing intact and symmetric to whisper.  Facial sensation intact.  Face tongue, palate move normally and symmetrically. Shoulder shrug normal Motor: Normal bulk and tone. Normal strength in all tested extremity muscles. Sensory: Intact to touch and temperature in all extremities.  Coordination: Rapid alternating movements normal in all extremities.  Finger-to-nose and heel-to shin performed accurately bilaterally.  Romberg negative. Gait and Station: Arises from chair without difficulty.  Stance is normal. Gait demonstrates normal stride length and balance.   Able to heel, toe and tandem walk without difficulty. Reflexes: 1+ and symmetric. Toes downgoing.   Impression: Generalized convulsive epilepsy ([place]) - Plan: Ambulatory referral to Neurosurgery, Ambulatory referral to Internal Medicine  Generalized anxiety disorder - Plan: Amb ref to [place], Ambulatory referral to Internal Medicine  Focal epilepsy [facility]) - Plan: Ambulatory referral to Neurosurgery, Ambulatory referral to Internal Medicine  Personal history of prematurity - Plan: Ambulatory referral to Neurosurgery, Ambulatory referral to Internal Medicine  Intellectual delay - Plan: Ambulatory referral to Neurosurgery, Ambulatory referral to Internal Medicine  Loss of weight - Plan: Ambulatory referral to Internal Medicine  Vitamin D deficiency - Plan: Ambulatory referral to Internal Medicine   Recommendations for plan of care: The patient's previous Epic records were reviewed. The recent MRI of the brain was reviewed with the patient and his father. I discussed options for him such as referral to neurosurgery for possible epilepsy surgery or placement of VNS device. Plan until next visit: Continue medications as prescribed   Continue to let me know when seizures occur I will refer to [facility] Neurosurgery as we discussed today for consideration of epilepsy surgery or placement of VNS I will refer to [place] for anxiety. I will refer to [facility] Internal Medicine to get established with a PCP. Return in about 3 months (around 12/23/2022).  The medication list was reviewed and reconciled. No changes were made in the prescribed medications today. A complete medication list was provided to the patient.  Orders Placed This Encounter  Procedures   Amb ref to Integrated Behavioral Health    Referral Priority:   Routine    Referral Type:   Consultation    Referral Reason:   Specialty Services Required    Number of Visits Requested:   1   Ambulatory referral to Neurosurgery    Referral Priority:   Routine    Referral Type:   Surgical    Referral Reason:   Specialty Services Required    Requested Specialty:   Neurosurgery  Number of Visits Requested:   1   Ambulatory referral to Internal Medicine    Referral Priority:   Routine    Referral Type:   Consultation    Referral Reason:   Specialty Services Required    Requested Specialty:   Internal Medicine    Number of Visits Requested:   1   Allergies as of 09/23/2022       Reactions   Concerta [methylphenidate] Other (See Comments)   Would start crying for little things like when someone coughed or said a specific word for example.         Medication List        Accurate as of September 23, 2022  4:27 PM. If you have any questions, ask your nurse or doctor.          acetaminophen 325 MG tablet Commonly known as: TYLENOL Take 2 tablets (650 mg total) by mouth every 4 (four) hours as needed for mild pain (or temp >/= 99.5).   aspirin EC 325 MG tablet Take 325 mg by mouth daily.   Briviact 100 MG Tabs tablet Generic drug: brivaracetam Take 1 tablet twice per day. This is in addition to Briviact 50mg  twice per day for total of 150mg   twice per day.   Briviact 50 MG Tabs Generic drug: Brivaracetam Take 1 tablet twice per day. This is in addition to Briviact 100mg  twice per day for total of 150mg  twice per day.   LaMICtal XR 200 MG Tb24 24 hour tablet Generic drug: LamoTRIgine Take 1 tablet at night along with a Lamictal XR 300mg  tablet   LaMICtal XR 250 MG Tb24 24 hour tablet Generic drug: LamoTRIgine Take 1 tablet in the morning along with Lamictal XR 100mg    LaMICtal XR 300 MG Tb24 24 hour tablet Generic drug: LamoTRIgine Take 1 tablet at night along with Lamictal XR 200mg    Oxtellar XR 600 MG Tb24 Generic drug: OXcarbazepine ER Take 1 daily at bedtime   Vitamin D (Ergocalciferol) 1.25 MG (50000 UNIT) Caps capsule Commonly known as: DRISDOL Take 1 capsule (50,000 Units total) by mouth every 7 (seven) days.      I discussed this patient's care with Dr to develop this assessment and plan.  Total time spent with the patient was 30 minutes, of which 50% or more was spent in counseling and coordination of care.  NP-C [facility] [address], [address] [place],  Ph. [phone] Fax [phone]

## 2022-09-24 ENCOUNTER — Encounter (INDEPENDENT_AMBULATORY_CARE_PROVIDER_SITE_OTHER): Payer: Self-pay

## 2022-09-25 ENCOUNTER — Encounter (INDEPENDENT_AMBULATORY_CARE_PROVIDER_SITE_OTHER): Payer: Self-pay | Admitting: Family

## 2022-09-25 NOTE — Patient Instructions (Signed)
It was a pleasure to see you today!  Instructions for you until your next appointment are as follows: Continue your medications as prescribed Let me know if you need refills - it is important to not miss any doses Continue to let me know when seizures occur I will refer you to [facility] Neurosurgery as we discussed today for consideration of epilepsy surgery or placement of VNS I will refer you to [place] in this office for anxiety. I will refer you to [facility] Internal Medicine to get established with a PCP. Return in about 3 months or sooner if needed.   Feel free to contact our office during normal business hours at [phone] with questions or concerns. If there is no answer or the call is outside business hours, please leave a message and our clinic staff will call you back within the next business day.  If you have an urgent concern, please stay on the line for our after-hours answering service and ask for the on-call neurologist.     I also encourage you to use MyChart to communicate with me more directly. If you have not yet signed up for MyChart within [facility], the front desk staff can help you. However, please note that this inbox is NOT monitored on nights or weekends, and response can take up to 2 business days.  Urgent matters should be discussed with the on-call pediatric neurologist.   At [facility], we are committed to providing exceptional care. You will receive a patient satisfaction survey through text or email regarding your visit today. Your opinion is important to me. Comments are appreciated.

## 2022-09-30 NOTE — BH Specialist Note (Signed)
Integrated Behavioral Health Initial In-Person Visit  MRN: [id number] Name: Franklin [name]  Number of [place] Clinician visits: First Visit- (1/6) Session Start time: 2:30 PM Session End time: 3:24 PM Total time in minutes: 54 min.   Types of Service: Individual psychotherapy  Interpretor:No.   Clinician introduced herself and gave an overview of [place] services, including information about structure and billing. Clinician reviewed confidentiality guidelines with patient.    Subjective: Franklin [name] is a 27 y.o. male accompanied by  Self. Patient was referred by Rockwell Germany for an anxiety and worry.  Patient reports the following symptoms/concerns: Patient reports he worries everyday, especially when it comes to his Epilepsy.In patient words, he reports feeling nervous "my whole life."  Duration of problem: Patient reports he has been nervous has experienced worry for years.; Severity of problem: severe  Objective: Mood: Euthymic and Affect: Appropriate Risk of harm to self or others: No plan to harm self or others   Life Context: Family and Social: Patient lives with his dad, aunt, cousin and a dog in a small living space. Patient reports he does not have his own space. School/Work: Patient is not in school and reports he can not work due to his epilepsy.  Self-Care: Patient reports he enjoys listening to music, writing songs, listening to music,drawing and going for long walks.  Life Changes: In patient words, he reports he "forced" to move to [place] and out of his previous home 11 years ago. Patient reports being close to is great grandmother and having a difficult time with her death when he was 27 years old.  Patient and/or Family's Strengths/Protective Factors: Patient demonstrates a desire and willingness to learn express his feelings and thoughts in a safe space and an awareness of the his need for an outlet.   Goals  Addressed: Patient will: Reduce symptoms of: anxiety and depression Increase knowledge and/or ability of: coping skills, healthy habits, and psychoeducation   Demonstrate ability to: Increase healthy adjustment to current life circumstances and psychoeducation about the impacts of depression and anxiety, awareness of the connection between  thoughts feelings and behaviors, and ability to use coping and relaxation skills outside of sessions.   Progress towards Goals: Ongoing  Interventions: Interventions utilized: Motivational Interviewing, CBT Cognitive Behavioral Therapy, and Psychoeducation and/or Health Education  Standardized Assessments completed: PHQ-SADS      10/01/2022    2:58 PM  PHQ-SADS Last 3 Score only  PHQ-15 Score 8  Total GAD-7 Score 12  PHQ Adolescent Score 14     Patient and/or Family Response: Patient receptive to assessment and interpretation of scores. Patient open and attentive to psychoeducation about how mental health challenges can impact the body through physical symptoms. Patient reports willingness to attend [facility] sessions and agrees with treatment goals set.   Patient Centered Plan: Patient is on the following Treatment Plan(s): Patient will learn to identify negative thinking patterns, understand how they impact feelings and behaviors. Patient will learning coping and relaxation skills for regular use and will learn how depression can impact his ability to function, daily.   Assessment: Patient currently experiencing moderate levels of depression and mild levels of anxiety. As evidenced by PHQ-SAD results, patient reports having thoughts that he would be better off dead or of hurting himself in some way "nearly every day." Clinician explored further and confirmed that patient does not have a plan to hurt or harm himself. Clinician reviewed confidentiality guidelines and processed with patient  the steps that would be taken if he did have  a plan. Provided patient with the National Suicide Hotline Number and [facility] Urgent care address and number.    Patient may benefit from continued attendance to integrated behavioral health sessions with a possible community mental health referral at the end of sessions, negative thought tracking and learning ways to cope with the thoughts and psychoeducation about depression and anxiety.   Plan: Follow up with behavioral health clinician on : 2 weeks Behavioral recommendations: See treatment plan. Referral(s): [facility] (In Clinic) "From scale of 1-10, how likely are you to follow plan?": Patient in agreement with plan and goals.  Valda Favia, LCSW

## 2022-10-01 ENCOUNTER — Ambulatory Visit (INDEPENDENT_AMBULATORY_CARE_PROVIDER_SITE_OTHER): Payer: Medicaid Other | Admitting: Licensed Clinical Social Worker

## 2022-10-01 ENCOUNTER — Telehealth (INDEPENDENT_AMBULATORY_CARE_PROVIDER_SITE_OTHER): Payer: Self-pay | Admitting: Family

## 2022-10-01 DIAGNOSIS — F32A Depression, unspecified: Secondary | ICD-10-CM | POA: Diagnosis not present

## 2022-10-01 DIAGNOSIS — F411 Generalized anxiety disorder: Secondary | ICD-10-CM

## 2022-10-01 DIAGNOSIS — F419 Anxiety disorder, unspecified: Secondary | ICD-10-CM

## 2022-10-01 DIAGNOSIS — F331 Major depressive disorder, recurrent, moderate: Secondary | ICD-10-CM

## 2022-10-01 DIAGNOSIS — G40309 Generalized idiopathic epilepsy and epileptic syndromes, not intractable, without status epilepticus: Secondary | ICD-10-CM

## 2022-10-01 MED ORDER — OXTELLAR XR 600 MG PO TB24: ORAL_TABLET | ORAL | 5 refills | Status: DC

## 2022-10-01 NOTE — Telephone Encounter (Signed)
Dad contacted me to report that Franklin [name] had a short seizure this morning. I recommended that he increase the Oxtellar XR 600mg  tablets to 2 tablets at bedtime. TG

## 2022-10-02 ENCOUNTER — Ambulatory Visit: Payer: Medicaid Other | Admitting: Student

## 2022-10-02 NOTE — Telephone Encounter (Signed)
General Seizure Questions   Ask frequency of seizures - number in a day, week, month, etc. Ask when last seizure occurred.   - Patients last seizure was 10/01/2022. The seizure lasted about 10 seconds.   Ask to describe seizures - if caller says "usual seizures", get description anyway.   -Dad stated that the seizure this morning lasted for about 10 seconds. Patient was jerking, making grunting noise and tensing up.     Ask about seizure medications - verify dose, type, frequency, compliance. Ask about side effects.   - Dad reports that the patient has been taking the medication as prescribed.   Ask if the patient has been sick, under undue stress, has missed sleep.   - Dad reports that the patients mother got her truck stuck in the yard last night so they were trying to help her with that. Because of this the patient went to bed later than normal.    If the caller reports a rash, ask when the med was started, if any other meds were given at the same time, any different foods, detergents, lotions, etc. Get description of rash, along with any other symptoms with the rash (nausea, vomiting, diarrhea, etc). Sometimes best to have patient stop by to look at rash if parents have difficulty describing or are unreliable in description.   - Dad does not report any rashes or changed.

## 2022-10-02 NOTE — Telephone Encounter (Signed)
Who's Calling: Franklin [name] (father)  Provider: Rockwell Germany  Contact Number: [phone]   Reason for call: Father reports that Franklin [name] had 2 seizures, one yesterday (10/01/2022) and today (10/02/2022) and is requesting a call back.

## 2022-10-02 NOTE — Telephone Encounter (Signed)
The Oxtellar dose was just increased and has not had time to increase the blood level. I left a message for Dad and the patient to continue to take the increased dose and to let me know if more seizures occur. TG

## 2022-10-14 ENCOUNTER — Ambulatory Visit: Payer: Medicaid Other | Admitting: Student

## 2022-10-15 ENCOUNTER — Ambulatory Visit (INDEPENDENT_AMBULATORY_CARE_PROVIDER_SITE_OTHER): Payer: Medicaid Other | Admitting: Licensed Clinical Social Worker

## 2022-10-15 DIAGNOSIS — F331 Major depressive disorder, recurrent, moderate: Secondary | ICD-10-CM

## 2022-10-15 DIAGNOSIS — F32A Depression, unspecified: Secondary | ICD-10-CM | POA: Diagnosis not present

## 2022-10-15 DIAGNOSIS — F411 Generalized anxiety disorder: Secondary | ICD-10-CM

## 2022-10-15 DIAGNOSIS — F419 Anxiety disorder, unspecified: Secondary | ICD-10-CM

## 2022-10-15 NOTE — BH Specialist Note (Signed)
Integrated Behavioral Health Follow Up In-Person Visit  MRN: [id number] Name: Franklin [name]  Number of [facility] Clinician visits: Follow up - (2/6)  Session Start time: 11:35 AM Session End time: 12:15 PM Total time in minutes: 40 min.  Types of Service: Individual psychotherapy  Interpretor:No.   Franklin [name] is a 27 y.o. male accompanied by  Self.  Patient was referred by Rockwell Germany, NP for anxiety and worry.  Subjective: Patient reports identifying negative thought pattens that impact him daily. Patient reports repetitive thoughts such as "I am a failure." "I don't have anyone to talk to." Patient reports "dark and negative thoughts" most days of the week. Patient reports worrying about his fathers heart and medical care, reporting he does not want to talk  further about it. Patient reports feeling stressed by his cousin and arguing recently in his home environment.   Duration on of initial problem: Patient reports he has been nervous and has experienced worry for years; Severity of problem: severe  Objective: Mood: Euthymic and Affect: Appropriate Risk of harm to self or others: No plan to harm self or others  Patient and/or Family's Strengths/Protective Factors: Patient continues to demonstrate a desire and willingness to learn how to express his feelings and thoughts in a safe space and an awareness of the need for an outlet  Goals Addressed: Patient will:  Reduce symptoms of: anxiety and depression   Increase knowledge and/or ability of: coping skills, healthy habits, and psychoeducation    Demonstrate ability to: Increase healthy adjustment to current life circumstances and psychoeducation about the impacts of depression and anxiety, awareness of the connection between thoughts feelings and behaviors, and ability to use coping and relaxation skills outside of sessions.   Progress towards Goals: Ongoing  Interventions: Interventions utilized:   Motivational Interviewing, Mindfulness or Psychologist, educational, CBT Cognitive Behavioral Therapy, Supportive Counseling, Psychoeducation and/or Health Education, and Supportive Reflection Standardized Assessments completed: Not Needed  Clinician used psychoeducation about how thoughts feelings and behaviors are connected using the CBT- Cognitive Triangle. Clinician used psychoeducation about mindfulness and its benefits. Clinician taught patient Five Senses grounding, 5- see, 4- feel, 3-hear, 2- smell, and 1- taste.  Patient and/or Family Response: Patient reports the CBT-cognitive triangle made sense to him and reported relating to the different negative thinking examples. Patient also receptive to mindfulness and the five senses grounding.   Patient Centered Plan: Patient is on the following Treatment Plan(s): Patient will review cognitive triangle and think about it when he is having negative thoughts. Patient will practice Five Sense grounding.    Plan: Follow up with behavioral health clinician on : two weeks Behavioral recommendations: see goals addressed and treatment plan. Referral(s): [facility] (In Clinic) Patient agreed to plan.   Valda Favia, LCSW

## 2022-10-15 NOTE — Patient Instructions (Addendum)
Review cognitive triangle: How thoughts> feelings> actions are all connected. Think about this when you are having negative thoughts.   5- see 4- feel 3-hear 2- smell 1- taste  Use each of the five senses to take in the details of your surroundings in the present moment.

## 2022-10-26 ENCOUNTER — Other Ambulatory Visit (INDEPENDENT_AMBULATORY_CARE_PROVIDER_SITE_OTHER): Payer: Self-pay | Admitting: Family

## 2022-10-26 DIAGNOSIS — G40309 Generalized idiopathic epilepsy and epileptic syndromes, not intractable, without status epilepticus: Secondary | ICD-10-CM

## 2022-10-26 MED ORDER — LAMOTRIGINE ER 200 MG PO TB24: ORAL_TABLET | ORAL | 0 refills | Status: DC

## 2022-10-26 MED ORDER — LAMOTRIGINE ER 250 MG PO TB24: ORAL_TABLET | ORAL | 0 refills | Status: DC

## 2022-10-26 MED ORDER — LAMOTRIGINE ER 300 MG PO TB24: ORAL_TABLET | ORAL | 0 refills | Status: DC

## 2022-10-26 NOTE — Telephone Encounter (Signed)
I called and told Dad that I will send in scripts to [facility] but that they may have to order medication. I asked Dad to let me if that occurs. TG

## 2022-10-26 NOTE — Telephone Encounter (Signed)
  Name of who is calling: Lysbeth Galas Relationship to Patient: Father  Best contact number: [phone]  Provider they see: Rockwell Germany  Reason for call: Patient lost his Lamictal rx. He took it yesterday, but does not have any for today. Father stated patient is on an assistance program for this medication.      PRESCRIPTION REFILL ONLY  Name of prescription: Lamictal  Pharmacy:

## 2022-10-26 NOTE — Telephone Encounter (Signed)
Contacted pt's father. Verified patient's name and DOB as well as dad's name.  Dad states that Franklin [name] lost all 3 of his Lamictal prescriptions. Dad has called the Assistance program and they stated that they will be sending his refills in the mail.   I asked dad if he still had refills on file, he stated that he thinks he does but, not sure.   Last RX was sent in 8.22.2023 with 3 RF's for all 3.   SS, CCMA

## 2022-10-29 ENCOUNTER — Ambulatory Visit (INDEPENDENT_AMBULATORY_CARE_PROVIDER_SITE_OTHER): Payer: Medicaid Other | Admitting: Licensed Clinical Social Worker

## 2022-10-29 DIAGNOSIS — F331 Major depressive disorder, recurrent, moderate: Secondary | ICD-10-CM

## 2022-10-29 DIAGNOSIS — F411 Generalized anxiety disorder: Secondary | ICD-10-CM

## 2022-10-29 NOTE — Patient Instructions (Signed)
Review and practice:  Breathe in for 4 seconds, we hold for 7 seconds and Breathe out for 8 seconds

## 2022-10-29 NOTE — BH Specialist Note (Signed)
Integrated Behavioral Health Follow Up In-Person Visit  MRN: [id number] Name: Franklin [name]  Number of [place] Clinician visits: Third visit (3/6)  Session Start time: 10:31 AM  Session End time: 11:06 AM Total time in minutes: 35 min  Types of Service: Individual psychotherapy  Interpretor:No.   VAHID GRAESSLE is a 27 y.o. male accompanied by  self.  Patient was referred by Rockwell Germany, NP for anxiety and worry.  Subjective: Patient reports he has been using his coping skills learned in previous sessions. Patient reports using them has been going well as evidenced by the skills helping him stay present and less focused on negative thoughts. Patient reports the negative thoughts have not been bothering him as much. Patient reports the desire to get back into drawing.   Duration of initial problem: Patient reports he has been nervous and has experienced worry for years ; Severity of problem: severe  Objective: Mood: Euthymic and Affect: Appropriate Risk of harm to self or others: No plan to harm self or others  Patient and/or Family's Strengths/Protective Factors: Patient continues to demonstrate a desire and willingness to learn how to express his feelings and thoughts in a safe space and an awareness of the need for an outlet.  Goals Addressed: Patient will:  Reduce symptoms of: anxiety and depression   Increase knowledge and/or ability of: coping skills, healthy habits, and psychoeducation.    Demonstrate ability to: Increase healthy adjustment to current life circumstances and psychoeducation about the impacts of depression and anxiety, awareness of the connection between thoughts , feelings and behaviors, and ability to use coping and relaxation skills outside of session.  Progress towards Goals: Ongoing  Interventions: Interventions utilized:  Motivational Interviewing, Mindfulness or Psychologist, educational, CBT Cognitive Behavioral Therapy,  Supportive Counseling, and Supportive Reflection Standardized Assessments completed: Not Needed  Engaged patient in exploring and processing thought errors. Patient able to identify the thinking errors that often happened to him. Provided psychoeducation about deep breathing. Taught client 4-7-8 breathing for him to practice outside of session.   Patient and/or Family Response: Patient receptive to clinician assessment and intervention.   Patient Centered Plan: Patient is on the following Treatment Plan(s): Patient will review thinking errors and try to identify his thinking errors that can lead to negative thinking. Patient will review and practice 4-7-8 breathing. Breathing in for 4 seconds, holding for 7 and breathing out for 8.   Plan: Follow up with behavioral health clinician on : in two weeks Behavioral recommendations: See goals addressed and treatment plan Referral(s): [facility] (In Clinic) "From scale of 1-10, how likely are you to follow plan?": Very likely  Valda Favia, LCSW

## 2022-11-09 ENCOUNTER — Ambulatory Visit: Payer: Medicaid Other | Admitting: Internal Medicine

## 2022-11-12 ENCOUNTER — Ambulatory Visit (INDEPENDENT_AMBULATORY_CARE_PROVIDER_SITE_OTHER): Payer: Medicaid Other | Admitting: Licensed Clinical Social Worker

## 2022-11-12 DIAGNOSIS — F411 Generalized anxiety disorder: Secondary | ICD-10-CM

## 2022-11-12 DIAGNOSIS — F320 Major depressive disorder, single episode, mild: Secondary | ICD-10-CM | POA: Diagnosis not present

## 2022-11-12 NOTE — BH Specialist Note (Signed)
Integrated Behavioral Health Follow Up In-Person Visit  MRN: [id number] Name: Franklin [name]  Number of [place] Clinician visits: Fourth Visit- (4/6)  Session Start time: 2:27 pm Session End time: 2:54 pm Total time in minutes: 27 min  Types of Service: Individual psychotherapy  Interpretor:No.    Franklin [name] is a 27 y.o. male accompanied by  self.  Patient was referred by Rockwell Germany, NP for anxiety and worry.   Subjective:  -Patient reports he has been doing better mentally, using coping skills and reflecting on his thoughts. -Patient reports he thinks about the CBT triangle often when he is having a negative thought, often reflecting on how it is impacting him. -Patient reports he is arguing less with his mom and dad which has been good.  -Patient reports he continues to go outside to have time to himself, including walks with his aunt now.  -Patient reports he has been writing more songs lately and reports this as been helpful. -Patient reports he has not had any seizures since the last time we saw each other.    Duration of initial problem: Patient reports he has been nervous and has experienced worry for year; Severity of problem: moderate  Objective: Mood: Euthymic and Affect: Appropriate Risk of harm to self or others: No plan to harm self or others  Patient and/or Family's Strengths/Protective Factors: Patient continues to demonstrate a desire and willingness to learn how to express his feelings and thoughts in a safe space and an awareness of te need for an outlet.  Goals Addressed: Patient will:  Reduce symptoms of: anxiety and depression   Increase knowledge and/or ability of: coping skills, healthy habits, and psychoeducation.    Demonstrate ability to: Increase healthy adjustment to current life circumstances and psychoeducation about the impacts of depression and anxiety, awareness of the connection between thoughts, feelings and  behaviors, and ability to use coping ans relaxation skills outside of session.  Progress towards Goals: Ongoing- patient making significant progress.  Interventions: Interventions utilized:  Motivational Interviewing, Mindfulness or Psychologist, educational, CBT Cognitive Behavioral Therapy, Supportive Counseling, and Supportive Reflection Standardized Assessments completed: Not Needed  Engaged patient in further cognitive processing and reframing by engaging patient in putting thoughts on trial by using Socratic questioning.  Patient and/or Family Response: Patient receptive to interventions, further reporting that he has been using the cognitive and coping skills learned.Patient reports using the skills has helped him.   Patient Centered Plan: Patient is on the following Treatment Plan(s): Patient will continue reviewing and practicing the skills learned in session. Patient will review and use skill of Socratic questioning when cognitive reframing. Clinician will complete PHQ-SADs with patient and review progress in next session.    Plan: Follow up with behavioral health clinician on : a month Behavioral recommendations: See goals addressed and treatment plan.  Referral(s): [facility] (In Clinic) "From scale of 1-10, how likely are you to follow plan?": likely  Valda Favia, LCSW

## 2022-11-19 ENCOUNTER — Other Ambulatory Visit (INDEPENDENT_AMBULATORY_CARE_PROVIDER_SITE_OTHER): Payer: Self-pay | Admitting: Family

## 2022-11-19 DIAGNOSIS — G40309 Generalized idiopathic epilepsy and epileptic syndromes, not intractable, without status epilepticus: Secondary | ICD-10-CM

## 2022-11-19 MED ORDER — LAMOTRIGINE ER 200 MG PO TB24: ORAL_TABLET | ORAL | 3 refills | Status: DC

## 2022-11-19 MED ORDER — LAMOTRIGINE ER 250 MG PO TB24: ORAL_TABLET | ORAL | 3 refills | Status: DC

## 2022-12-02 ENCOUNTER — Other Ambulatory Visit (INDEPENDENT_AMBULATORY_CARE_PROVIDER_SITE_OTHER): Payer: Self-pay | Admitting: Family

## 2022-12-02 DIAGNOSIS — E559 Vitamin D deficiency, unspecified: Secondary | ICD-10-CM

## 2022-12-09 ENCOUNTER — Telehealth (INDEPENDENT_AMBULATORY_CARE_PROVIDER_SITE_OTHER): Payer: Self-pay | Admitting: Family

## 2022-12-09 NOTE — Telephone Encounter (Signed)
Dad called in asking for advice. Pt. Is having trouble with BM and is requesting assistance or advice. Please respond.

## 2022-12-10 NOTE — Telephone Encounter (Signed)
I called and spoke with patient's father. I asked him to tell Franklin [name] to be sure that he should drink more water, work on consuming more fiber in his diet, and to work on getting established with a PCP to work on this problem as well as general health. Dad agreed with this plan. TG

## 2022-12-10 NOTE — Telephone Encounter (Signed)
Contacted patient. (Meant to call dad) Patient stated that he had a BM yesterday. He usually has BM's once or twice a week and it hurts.  He states that the BM is solid. Parent believe that he isn't eating enough fiber.   Informed patient that I will let the provider know.   SS, CCMA

## 2022-12-14 ENCOUNTER — Ambulatory Visit (INDEPENDENT_AMBULATORY_CARE_PROVIDER_SITE_OTHER): Payer: Medicaid Other | Admitting: Licensed Clinical Social Worker

## 2022-12-14 NOTE — BH Specialist Note (Deleted)
Integrated Behavioral Health Follow Up In-Person Visit  MRN: [id number] Name: Franklin [name]  Number of Integrated Behavioral Health Clinician visits: Fifth visit (5/6)  Session Start time:  Session End time:  Total time in minutes:   Types of Service: Individual psychotherapy  Interpretor:No.   Subjective: Franklin [name] is a 27 y.o. male accompanied by  Self  Patient was referred by Franklin Rising, NP for anxiety and worry.   Subjective: ***  Duration of initial problem: Patient reports he has been nervous and has experienced worry for years; Severity of problem: moderate  Objective: Mood: {BHH MOOD:22306} and Affect: {BHH AFFECT:22307} Risk of harm to self or others: {CHL AMB BH Suicide Current Mental Status:21022748}  Patient and/or Family's Strengths/Protective Factors: Patient continues to demonstrate a desire and willingness to learn how to express his feelings and thoughts in a safe space and an awareness of the need for an outlet.   Goals Addressed: Patient will:  Reduce symptoms of: anxiety and depression   Increase knowledge and/or ability of: coping skills, healthy habits, and psychoeducation    Demonstrate ability to: Increase healthy adjustment to current life circumstances and psychoeducation about the impact of depression and anxiety, awareness of the connection between thoughts, feelings and behaviors, and ability to use coping and relaxation skills outside of session.  Progress towards Goals: Ongoing- Patient making significant process.  Interventions: Interventions utilized:  {IBH Interventions:21014054} Standardized Assessments completed: {IBH Screening Tools:21014051}  Patient and/or Family Response: ***  Patient Centered Plan: Patient is on the following Treatment Plan(s): ***   Plan: Follow up with behavioral health clinician on : a month Behavioral recommendations: *** Referral(s): [facility] [employer] [facility] (In Clinic) "From  scale of 1-10, how likely are you to follow plan?": ***  Jill Side, LCSW

## 2022-12-15 NOTE — BH Specialist Note (Unsigned)
Integrated Behavioral Health Follow Up In-Person Visit  MRN: [id number] Name: Franklin [name]  Number of Integrated Behavioral Health Clinician visits: Fifth visit (5/6)  Session Start time: 1449 Session End time: 1529 Total time in minutes: 40 min.   Types of Service: Individual psychotherapy  Interpretor:No.   Subjective: Franklin [name] is a 27 y.o. male accompanied by  self.  Patient was referred by Elveria Rising, NP for anxiety and worry.   Subjective:  -Patient reports he has been doing well and continuing to follow his plan and use coping skills. -Patient reports being able to manage stress a little better now. -Patient reports he did have a seizure yesterday, further reporting dad mentioned this was the longest one he has had in a while , reporting it lasted a few minutes rather than a few seconds. -Patient reports seizure's usually impact him physically and mentally, reporting he slept for a good portion of the day. -Patient reports every time he has a seizure it reminds him of two separate times he had one and hurt himself to the point where he had to get stitches.   Duration of initial problem: Patient reports he has been nervous and has experienced worry for years; Severity of problem: moderate  Objective: Mood: Euthymic and Affect: Appropriate Risk of harm to self or others: No plan to harm self or others  Patient and/or Family's Strengths/Protective Factors: Patient continues to demonstrate a desire and willingness to earn how to express is feelings and thoughts in a safe space and an awareness of the need for an outlet.  Goals Addressed: Patient will:  Reduce symptoms of: anxiety and depression   Increase knowledge and/or ability of: coping skills, healthy habits, and psychoeducation.   Demonstrate ability to: Increase healthy adjustment to current life circumstances and psychoeducation about the impacts of depression and anxiety, awareness of the connection  between thoughts, feelings and behaviors, and ability to use coping and relaxation skills outside of session.   Progress towards Goals: Ongoing- Patient making significant progress.  Interventions: Interventions utilized:  Motivational Interviewing, Supportive Counseling, Psychoeducation and/or Health Education, and Supportive Reflection, Community resource Standardized Assessments completed: Not Needed  Clinician referred patient to [employer], Programme researcher, broadcasting/film/video at [employer] in [place] as a resource to help with food insecurity.      12/16/2022    3:06 PM 10/01/2022    2:58 PM  PHQ-SADS Last 3 Score only  PHQ-15 Score 11 8  Total GAD-7 Score 10 12  PHQ Adolescent Score 8 14    Patient and/or Family Response: Patient attentive and receptive to clinician assessment. Patient reports feeling good about the progress he has made, further reporting he no longer has thoughts of wanting to harm himself. Patient reports having issues with his bowels recently as well as aches in his body. Patient reports the family rarely has enough nutritious food available to them, which could be contributing to patients physical health and somatic symptoms.   Patient Centered Plan: Patient is on the following Treatment Plan(s): Patient will continue reviewing and practicing the skills learned in session. Patient will check out food pantry resource at [employer], instructions outlined in patient instructions. Patient will follow up and attend last IBH session in a month.    Plan: Follow up with behavioral health clinician on : in a month  Behavioral recommendations: See goals addressed and patient centered plan Referral(s): Integrated [employer] (In Clinic) "From scale of 1-10, how likely are you to follow plan?":  likely  Valda Favia, LCSW

## 2022-12-16 ENCOUNTER — Ambulatory Visit (INDEPENDENT_AMBULATORY_CARE_PROVIDER_SITE_OTHER): Payer: Medicaid Other | Admitting: Licensed Clinical Social Worker

## 2022-12-16 DIAGNOSIS — F330 Major depressive disorder, recurrent, mild: Secondary | ICD-10-CM

## 2022-12-16 DIAGNOSIS — F411 Generalized anxiety disorder: Secondary | ICD-10-CM | POA: Diagnosis not present

## 2022-12-16 NOTE — Patient Instructions (Addendum)
Definition Chief Financial Officer Open to the Public:  Wednesday: 12:00-7:00pm Friday: 11:00am-2:00pm Saturday: 9:00am-12:00pm  [address] [place], [place] [address]

## 2022-12-30 ENCOUNTER — Telehealth (INDEPENDENT_AMBULATORY_CARE_PROVIDER_SITE_OTHER): Payer: Self-pay | Admitting: Family

## 2022-12-30 NOTE — Telephone Encounter (Signed)
I called and spoke with Dad. I recommended no changes in medication at this time. Franklin [name] has an appointment with me soon and I would like to talk with him about sleep hygiene and considering a medication such as Clonidine for sleep. TG

## 2022-12-30 NOTE — Telephone Encounter (Signed)
General Seizure Questions   Ask frequency of seizures - number in a day, week, month, etc. Ask when last seizure occurred.   - Patients last seizure was    Ask to describe seizures - if caller says "usual seizures", get description anyway.   - A lot of grunting and jerking, bubbling at the lips. Dad stated that it lasted less than a minute. Patient stated that it was a lot stonger than usual.    Ask about seizure medications - verify dose, type, frequency, compliance. Ask about side effects.   - Dad reports no missed doses.    Ask if the patient has been sick, under undue stress, has missed sleep.   - Patient was late going to bed the night before.    If the caller reports a rash, ask when the med was started, if any other meds were given at the same time, any different foods, detergents, lotions, etc. Get description of rash, along with any other symptoms with the rash (nausea, vomiting, diarrhea, etc). Sometimes best to have patient stop by to look at rash if parents have difficulty describing or are unreliable in description.   - Dad reports no changes.

## 2022-12-30 NOTE — Telephone Encounter (Signed)
Who's calling (name and relationship to patient) : [name]- dad  Best contact [phone]  Provider they see: Elveria Rising   Reason for call: Dad called in to inform Elveria Rising that Franklin [name] had another seizure.    Call ID:      PRESCRIPTION REFILL ONLY  Name of prescription:  Pharmacy:

## 2023-01-08 ENCOUNTER — Encounter (INDEPENDENT_AMBULATORY_CARE_PROVIDER_SITE_OTHER): Payer: Self-pay | Admitting: Family

## 2023-01-14 ENCOUNTER — Telehealth (INDEPENDENT_AMBULATORY_CARE_PROVIDER_SITE_OTHER): Payer: Self-pay | Admitting: Family

## 2023-01-14 ENCOUNTER — Ambulatory Visit (INDEPENDENT_AMBULATORY_CARE_PROVIDER_SITE_OTHER): Payer: Medicaid Other | Admitting: Family

## 2023-01-14 NOTE — Telephone Encounter (Signed)
Franklin [name] did not show for an appointment today. I called and left a message asking him to call back as I am concerned about the elevated Lamotrigine dose and had planned to work on a taper plan. I asked him to call back. TG

## 2023-01-15 ENCOUNTER — Encounter (INDEPENDENT_AMBULATORY_CARE_PROVIDER_SITE_OTHER): Payer: Self-pay

## 2023-01-15 ENCOUNTER — Ambulatory Visit (INDEPENDENT_AMBULATORY_CARE_PROVIDER_SITE_OTHER): Payer: Medicaid Other | Admitting: Licensed Clinical Social Worker

## 2023-01-15 DIAGNOSIS — F419 Anxiety disorder, unspecified: Secondary | ICD-10-CM | POA: Diagnosis not present

## 2023-01-15 DIAGNOSIS — F411 Generalized anxiety disorder: Secondary | ICD-10-CM

## 2023-01-15 DIAGNOSIS — F32A Depression, unspecified: Secondary | ICD-10-CM | POA: Diagnosis not present

## 2023-01-15 DIAGNOSIS — F330 Major depressive disorder, recurrent, mild: Secondary | ICD-10-CM

## 2023-01-15 NOTE — BH Specialist Note (Signed)
Integrated Behavioral Health Follow Up In-Person Visit  MRN: [id number] Name: Franklin [name]  Number of Integrated Behavioral Health Clinician visits: Sixth Visit (6/6)  Session Start time: 11:15 am Session End time:  11:47 am Total time in minutes: 31 min  Types of Service: Individual psychotherapy  Interpretor:No.   Subjective: Franklin [name] is a 27 y.o. male accompanied by  self.  Patient was referred by Elveria Rising, NP for anxiety and worry.  Subjective:   -Patient reports he has not had any seizures in the past month. -Patient reports he continues to do well, further reporting he is managing stress better.   Duration of initial problem: Patient reports he has been nervous and has experienced worry for years; Severity of problem: moderate   Objective: Mood: Euthymic and Affect: Appropriate Risk of harm to self or others: No plan to harm self or others  Patient and/or Family's Strengths/Protective Factors: Patient demonstrated desire and willingness to learn how to express his feelings and thoughts in a safe space and an awareness of the need for an outlet. Patient developed healthy habits and skills to use when he feels overwhelmed and dysregulated.  Goals Addressed: Patient will:  Reduce symptoms of: anxiety and depression   Increase knowledge and/or ability of: coping skills, healthy habits, and psychoeducation.   Demonstrate ability to: Increase healthy adjustment to current life circumstances and psychoeducation about the impacts of depression and anxiety, awareness of the connection between thoughts, feelings and behaviors, and ability to use coping and relaxation skills outside of session.  Progress towards Goals: Achieved  Interventions: Interventions utilized:  Motivational Interviewing, Supportive Counseling, Psychoeducation and/or Health Education, and Supportive Reflection Standardized Assessments completed: Not Needed  Engaged patient in Mental  Health Maintenance Planning.Explored and processed triggers, warning signs, self care techniques and coping skills.  Patient and/or Family Response: Patient receptive to clinician assessment. Patient able to identify triggers, warning signs, self care techniques and coping skills.  Patient Centered Plan: Patient is on the following Treatment Plan(s): Patient will continue reviewing and practicing the skills learned in session. Patient will reference Mental Health Maintenance Plan.   Plan: Follow up with behavioral health clinician on : no follow up Behavioral recommendations: continue using skills learned in IBH appointments and reference  Referral(s):  none at this time "From scale of 1-10, how likely are you to follow plan?": likely  Jill Side, LCSW

## 2023-01-18 ENCOUNTER — Other Ambulatory Visit (INDEPENDENT_AMBULATORY_CARE_PROVIDER_SITE_OTHER): Payer: Self-pay | Admitting: Family

## 2023-01-18 DIAGNOSIS — E559 Vitamin D deficiency, unspecified: Secondary | ICD-10-CM

## 2023-01-19 ENCOUNTER — Telehealth (INDEPENDENT_AMBULATORY_CARE_PROVIDER_SITE_OTHER): Payer: Self-pay | Admitting: Family

## 2023-01-19 NOTE — Telephone Encounter (Signed)
I left a message and asked Dad to call back. TG

## 2023-01-19 NOTE — Telephone Encounter (Signed)
Dad called back and we talked about the seizure Sammuel had today. The seizure was brief and like he has had in the past. Aspyn has an appointment with me on May 29 and I asked Dad to bring him in sooner, on May 20th. Dad agreed with this plan. TG

## 2023-01-19 NOTE — Telephone Encounter (Signed)
I called and left another message requesting a call back. TG 

## 2023-01-19 NOTE — Telephone Encounter (Signed)
  Name of who is calling: Roger Shelter Relationship to Patient: Father  Best contact number: [phone]  Provider they see: Blane Ohara  Reason for call: Damire had a seizure this morning and wanted to alert Inetta Fermo. [name] would also like for tina to call him at her earliest convenience.    PRESCRIPTION REFILL ONLY  Name of prescription:  Pharmacy:

## 2023-01-24 NOTE — Progress Notes (Signed)
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 He is taking and tolerating Lamictal XR and Briviact for his seizure disorder. He is taking Vitamin D3 for Vitamin D deficiency.     Today's concerns: Ongoing intermittent seizures Oxtellar XR was prescribed but he has not taken it since January because of problems getting it from the pharmacy Was seen at [facility]. Zonisamide was prescribed by he has not started it Vitamin D deficiency - last level 7ng/ml with range of 30-100 ng/ml in December 2023. Has been taking Drisdol every 7 days since then. Has been receiving services by integrative behavioral therapy for anxiety and depression Needs PCP Tason has been otherwise generally healthy since he was last seen. No health concerns today other than previously mentioned.  Review of systems: Please see HPI for neurologic and other pertinent review of systems. Otherwise all other systems were reviewed and were negative.  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   Body mass index (BMI) of 20.0-20.9 in adult 08/17/2022   Does not have health insurance 10/24/2021   Adjustment disorder with mixed anxiety and depressed mood 09/10/2021   Poor sleep 03/01/2021   Generalized anxiety disorder 04/10/2020   Loss of weight 04/10/2020   Seizure (HCC) 05/13/2019   Hypokalemia 05/12/2019   Hypomagnesemia 05/12/2019   Hypocalcemia 05/12/2019   Intellectual delay 03/10/2018   Personal history of prematurity  03/10/2018   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 He was tried on Concerta which caused significant emotional disturbance, then on Ritalin, then Adderall which worked well. He has mild hearing loss. 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   Birth History: He was born at [redacted] weeks gestation, weighing 1 lb 13 1/2 oz. He required ventilator support for sometime. He reportedly had no CNS hemorrhage. Growth and development was delayed.   Surgical history: Past Surgical History:  Procedure Laterality Date   Central line placement     As premature infant   REFRACTIVE SURGERY     For retinopathy of prematurity     Family history: family history includes Autism in his brother; Diabetes type II in his father; Hypercholesterolemia in his father; Hypertension in his father; Stroke in his paternal aunt and paternal grandmother.   Social history: Social History   Socioeconomic History   Marital status: Single    Spouse name: Not on file   Number of children: Not on file   Years of education: Not on  file   Highest education level: Not on file  Occupational History   Not on file  Tobacco Use   Smoking status: Never    Passive exposure: Current (Cousin smokes in home)   Smokeless tobacco: Never  Substance and Sexual Activity   Alcohol use: No   Drug use: No   Sexual activity: Not Currently  Other Topics Concern    Not on file  Social History Narrative   Parents are divorced. Tage lives with his father.   Social Determinants of Health   Financial Resource Strain: Low Risk  (05/12/2019)   Overall Financial Resource Strain (CARDIA)    Difficulty of Paying Living Expenses: Not hard at all  Food Insecurity: Food Insecurity Present (05/12/2019)   Hunger Vital Sign    Worried About Running Out of Food in the Last Year: Never true    Ran Out of Food in the Last Year: Sometimes true  Transportation Needs: No Transportation Needs (05/12/2019)   PRAPARE - Administrator, Civil Service (Medical): No    Lack of Transportation (Non-Medical): No  Physical Activity: Sufficiently Active (05/12/2019)   Exercise Vital Sign    Days of Exercise per Week: 7 days    Minutes of Exercise per Session: 30 min  Stress: Stress Concern Present (05/12/2019)   [name] of Occupational Health - Occupational Stress Questionnaire    Feeling of Stress : To some extent  Social Connections: Socially Isolated (05/12/2019)   Social Connection and Isolation Panel [NHANES]    Frequency of Communication with Friends and Family: Once a week    Frequency of Social Gatherings with Friends and Family: Once a week    Attends Religious Services: Never    Database administrator or Organizations: No    Attends Banker Meetings: Never    Marital Status: Never married  Intimate Partner Violence: Not At Risk (05/12/2019)   Humiliation, Afraid, Rape, and Kick questionnaire    Fear of Current or Ex-Partner: No    Emotionally Abused: No    Physically Abused: No    Sexually Abused: No    Past/failed meds: Copied from previous record: Levetiracetam - did not control seizures Fycompa - did not control seizures and made him dizzy Lamotrigine IR - dizziness Oxtellar XR - cost  Allergies: Allergies  Allergen Reactions   Concerta [Methylphenidate] Other (See Comments)    Would start crying for little things like when  someone coughed or said a specific word for example.     Immunizations: Immunization History  Administered Date(s) Administered   PFIZER(Purple Top)SARS-COV-2 Vaccination 12/08/2019, 01/01/2020   Tdap 06/30/2015, 09/14/2018, 02/18/2022    Diagnostics/Screenings: Copied from previous record: 09/14/2022 MRI Brain wo contrast - 1. No acute brain finding. 2. 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 3. Question mild volume loss and increased FLAIR signal within the hippocampus on the left compared to the right. I am not sure this is definite, but could indicate subtle mesial temporal sclerosis on the left. Relative asymmetric prominence of the occipital horn of the left lateral ventricle but without evidence of definable focal abnormality of the left occipital lobe.   07/03/2022 - rEEG - This is a abnormal record with the patient in awake and drowsy states due to generalized slowing.  No evidence of decreased seizure threshold, however does not rule out epilepsy.  Clinical correlation advised. Lorenz Coaster MD MPH  Physical Exam: BP 106/68   Pulse 88   Ht 5' 8.27" (1.734 m)   Wt 133 lb 9.6 oz (60.6 kg)   BMI 20.15 kg/m   General: Thin but otherwise well developed, well nourished man, seated, in no evident distress Head: Head normocephalic and atraumatic.  Oropharynx benign. Wears glasses Neck: Supple Cardiovascular: Regular rate and rhythm, no murmurs Respiratory: Breath sounds clear to auscultation Musculoskeletal: No obvious deformities or scoliosis Skin: No rashes or neurocutaneous lesions  Neurologic Exam Mental Status: Awake and fully alert.  Oriented to place and time.  Recent and remote memory intact.  Attention span, concentration, and fund of knowledge subnormal for age.  Mood and affect appropriate. Cranial Nerves: Fundoscopic exam reveals sharp disc margins.  Pupils equal,  briskly reactive to light.  Extraocular movements full without nystagmus. Hearing intact and symmetric to whisper.  Facial sensation intact.  Face tongue, palate move normally and symmetrically. Shoulder shrug normal Motor: Normal bulk and tone. Normal strength in all tested extremity muscles. Sensory: Intact to touch and temperature in all extremities.  Coordination: Rapid alternating movements normal in all extremities.  Finger-to-nose and heel-to shin performed accurately bilaterally.  Romberg negative. Gait and Station: Arises from chair without difficulty.  Stance is normal. Gait demonstrates normal stride length and balance.   Able to heel, toe and tandem walk without difficulty. Reflexes: 1+ and symmetric. Toes downgoing.   Impression: Generalized convulsive epilepsy (HCC) - Plan: VITAMIN D 25 Hydroxy (Vit-D Deficiency, Fractures), zonisamide (ZONEGRAN) 100 MG capsule, lamoTRIgine (LAMICTAL) 200 MG tablet  Long-term use of high-risk medication - Plan: VITAMIN D 25 Hydroxy (Vit-D Deficiency, Fractures)  Intellectual delay - Plan: VITAMIN D 25 Hydroxy (Vit-D Deficiency, Fractures)  Vitamin D deficiency - Plan: VITAMIN D 25 Hydroxy (Vit-D Deficiency, Fractures)  Generalized anxiety disorder  Adjustment disorder with mixed anxiety and depressed mood   Recommendations for plan of care: The patient's previous Epic records were reviewed. No recent diagnostic studies to be reviewed with the patient. Hansell has difficulty with paying Medicaid copays for medications and I will work to simplify his regimen. He has difficulty with memory and following directions.  Plan until next visit: Will taper and discontinue Lamictal Start Zonisamide  Continue medications as prescribed  Reminded to be compliant with medication and to stay on a regular sleep schedule Needs labs to recheck Vitamin D level Needs to see call [facility] to schedule an appointment -  [phone] Continue to  report seizures and any other concerns Will see Safir next week to review medication plan  The medication list was reviewed and reconciled. I reviewed the changes that were made in the prescribed medications today. A complete medication list was provided to the patient.  Orders Placed This Encounter  Procedures   VITAMIN D 25 Hydroxy (Vit-D Deficiency, Fractures)    Standing Status:   Future    Standing Expiration Date:   01/24/2024    Allergies as of 01/25/2023       Reactions   Concerta [methylphenidate] Other (See Comments)   Would start crying for little things like when someone coughed or said a specific word for example.         Medication List        Accurate as of Jan 25, 2023 11:59 PM. If you have any questions, ask your nurse or doctor.          STOP taking these medications    Oxtellar XR 600 MG Tb24 Generic drug: OXcarbazepine  ER Stopped by: Elveria Rising, NP       TAKE these medications    acetaminophen 325 MG tablet Commonly known as: TYLENOL Take 2 tablets (650 mg total) by mouth every 4 (four) hours as needed for mild pain (or temp >/= 99.5).   aspirin EC 325 MG tablet Take 325 mg by mouth daily.   Briviact 100 MG Tabs tablet Generic drug: brivaracetam Take 1 tablet twice per day. This is in addition to Briviact 50mg  twice per day for total of 150mg  twice per day.   Briviact 50 MG Tabs Generic drug: Brivaracetam Take 1 tablet twice per day. This is in addition to Briviact 100mg  twice per day for total of 150mg  twice per day.   LamoTRIgine 300 MG Tb24 24 hour tablet TAKE 1 TABLET AT NIGHT ALONG WITH LAMICTAL XR200MG  What changed: Another medication with the same name was added. Make sure you understand how and when to take each. Changed by: Elveria Rising, NP   LamoTRIgine 250 MG Tb24 24 hour tablet Commonly known as: LaMICtal XR Take 1 tablet in the morning What changed: Another medication with the same name was added. Make sure you  understand how and when to take each. Changed by: Elveria Rising, NP   LamoTRIgine 200 MG Tb24 24 hour tablet Commonly known as: LaMICtal XR Take 1 tablet at night along with a Lamictal XR 300mg  tablet What changed: Another medication with the same name was added. Make sure you understand how and when to take each. Changed by: Elveria Rising, NP   lamoTRIgine 200 MG tablet Commonly known as: LaMICtal Take 1 tablet in the morning and take 2 tablets at night What changed: You were already taking a medication with the same name, and this prescription was added. Make sure you understand how and when to take each. Changed by: Elveria Rising, NP   Vitamin D (Ergocalciferol) 1.25 MG (50000 UNIT) Caps capsule Commonly known as: DRISDOL TAKE 1 CAPSULE BY MOUTH EVERY 7 DAYS   zonisamide 100 MG capsule Commonly known as: ZONEGRAN Take 1 capsule at bedtime What changed:  how much to take how to take this when to take this additional instructions Changed by: Elveria Rising, NP       Total time spent with the patient was 30 minutes, of which 50% or more was spent in counseling and coordination of care.  Elveria Rising NP-C [facility] [address], [address] [place], [place] [address] Ph. [phone] Fax [phone]

## 2023-01-25 ENCOUNTER — Encounter (INDEPENDENT_AMBULATORY_CARE_PROVIDER_SITE_OTHER): Payer: Self-pay | Admitting: Family

## 2023-01-25 ENCOUNTER — Ambulatory Visit (INDEPENDENT_AMBULATORY_CARE_PROVIDER_SITE_OTHER): Payer: Medicaid Other | Admitting: Family

## 2023-01-25 VITALS — BP 106/68 | HR 88 | Ht 68.27 in | Wt 133.6 lb

## 2023-01-25 DIAGNOSIS — F819 Developmental disorder of scholastic skills, unspecified: Secondary | ICD-10-CM

## 2023-01-25 DIAGNOSIS — F411 Generalized anxiety disorder: Secondary | ICD-10-CM | POA: Diagnosis not present

## 2023-01-25 DIAGNOSIS — E559 Vitamin D deficiency, unspecified: Secondary | ICD-10-CM | POA: Diagnosis not present

## 2023-01-25 DIAGNOSIS — G40309 Generalized idiopathic epilepsy and epileptic syndromes, not intractable, without status epilepticus: Secondary | ICD-10-CM | POA: Diagnosis not present

## 2023-01-25 DIAGNOSIS — Z79899 Other long term (current) drug therapy: Secondary | ICD-10-CM

## 2023-01-25 DIAGNOSIS — F4323 Adjustment disorder with mixed anxiety and depressed mood: Secondary | ICD-10-CM

## 2023-01-25 MED ORDER — LAMOTRIGINE 200 MG PO TABS: ORAL_TABLET | ORAL | 0 refills | Status: DC

## 2023-01-25 MED ORDER — ZONISAMIDE 100 MG PO CAPS: ORAL_CAPSULE | ORAL | 0 refills | Status: DC

## 2023-01-25 NOTE — Patient Instructions (Addendum)
It was a pleasure to see you today!  Instructions for you until your next appointment are as follows: Start Zonisamide 100mg  - 1 capsule at bedtime until you return to see me next week Start Lamotrigine 200mg  - 1 tablet in the morning and 2 tablets at night When you get the Lamotrigine from the pharmacy, STOP ALL LAMICTAL XR THAT YOU HAVE FROM THE PATIENT ASSISTANCE PROGRAM. Do not take both the Lamotrigine from the pharmacy and the Lamictal XR from the patient assistance program.  Continue taking Briviact 100mg  and 50mg  in the morning and at night Return to this office this Thursday between 8:15 and 4:00 to have blood drawn Be sure that you are drinking at least 48 oz of water every day. This will help with headaches and your medication Please sign up for MyChart if you have not done so. Please keep the appointment with me on Feb 03, 2023 @ 1:30PM  Feel free to contact our office during normal business hours at [phone] with questions or concerns. If there is no answer or the call is outside business hours, please leave a message and our clinic staff will call you back within the next business day.  If you have an urgent concern, please stay on the line for our after-hours answering service and ask for the on-call neurologist.     I also encourage you to use MyChart to communicate with me more directly. If you have not yet signed up for MyChart within [facility], the front desk staff can help you. However, please note that this inbox is NOT monitored on nights or weekends, and response can take up to 2 business days.  Urgent matters should be discussed with the on-call pediatric neurologist.   At [facility], we are committed to providing exceptional care. You will receive a patient satisfaction survey through text or email regarding your visit today. Your opinion is important to me. Comments are appreciated.

## 2023-01-29 ENCOUNTER — Encounter (INDEPENDENT_AMBULATORY_CARE_PROVIDER_SITE_OTHER): Payer: Self-pay | Admitting: Family

## 2023-01-29 DIAGNOSIS — E559 Vitamin D deficiency, unspecified: Secondary | ICD-10-CM | POA: Insufficient documentation

## 2023-02-03 ENCOUNTER — Encounter (INDEPENDENT_AMBULATORY_CARE_PROVIDER_SITE_OTHER): Payer: Self-pay | Admitting: Family

## 2023-02-03 ENCOUNTER — Ambulatory Visit (INDEPENDENT_AMBULATORY_CARE_PROVIDER_SITE_OTHER): Payer: Medicaid Other | Admitting: Family

## 2023-02-03 VITALS — BP 110/70 | HR 88 | Ht 69.09 in | Wt 135.0 lb

## 2023-02-03 DIAGNOSIS — F411 Generalized anxiety disorder: Secondary | ICD-10-CM | POA: Diagnosis not present

## 2023-02-03 DIAGNOSIS — E559 Vitamin D deficiency, unspecified: Secondary | ICD-10-CM

## 2023-02-03 DIAGNOSIS — F819 Developmental disorder of scholastic skills, unspecified: Secondary | ICD-10-CM

## 2023-02-03 DIAGNOSIS — G40309 Generalized idiopathic epilepsy and epileptic syndromes, not intractable, without status epilepticus: Secondary | ICD-10-CM

## 2023-02-03 DIAGNOSIS — Z79899 Other long term (current) drug therapy: Secondary | ICD-10-CM

## 2023-02-03 DIAGNOSIS — G40109 Localization-related (focal) (partial) symptomatic epilepsy and epileptic syndromes with simple partial seizures, not intractable, without status epilepticus: Secondary | ICD-10-CM

## 2023-02-03 IMAGING — CT CT HEAD W/O CM
3 series · 15 of 47 positions shown, 18 images · non-contrast
Comparison: 08/05/2020

CLINICAL DATA: Seizure, found down, abrasion to right face

EXAM:
CT HEAD WITHOUT CONTRAST
CT MAXILLOFACIAL WITHOUT CONTRAST
TECHNIQUE: Multidetector CT imaging of the head and maxillofacial structures
were performed using the standard protocol without intravenous
contrast. Multiplanar CT image reconstructions of the maxillofacial
structures were also generated.
RADIATION DOSE REDUCTION: This exam was performed according to the
departmental dose-optimization program which includes automated
exposure control, adjustment of the mA and/or kV according to
patient size and/or use of iterative reconstruction technique.

[Series 3: head wo · axial · 0.49mm/px · z∈[-148,-18]mm · 9 of 32 slices shown, 12 images]
[im 3/32  brain]
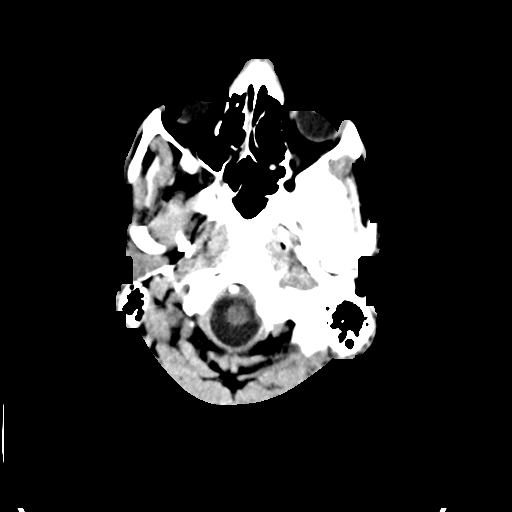
[im 3/32  bone]
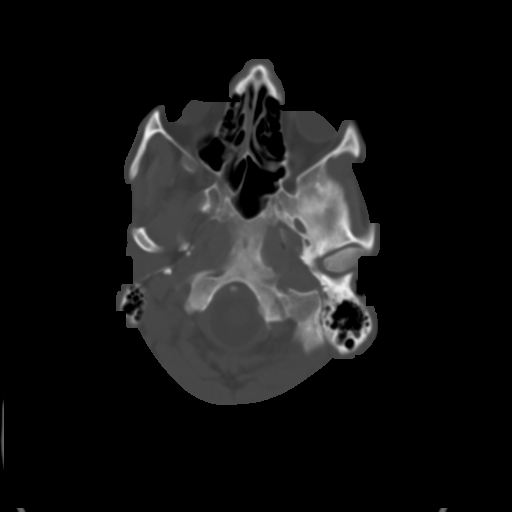
[im 6/32  brain]
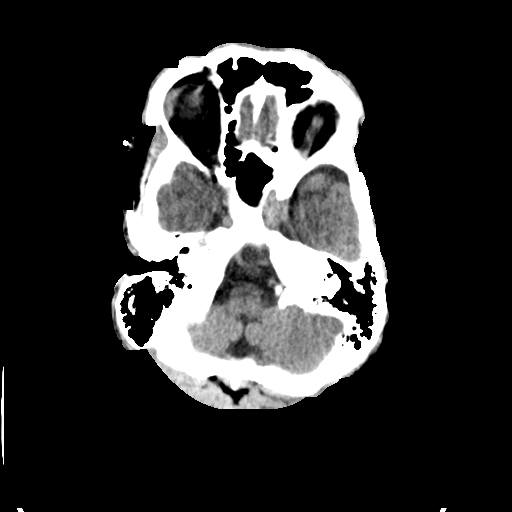
[im 9/32  brain]
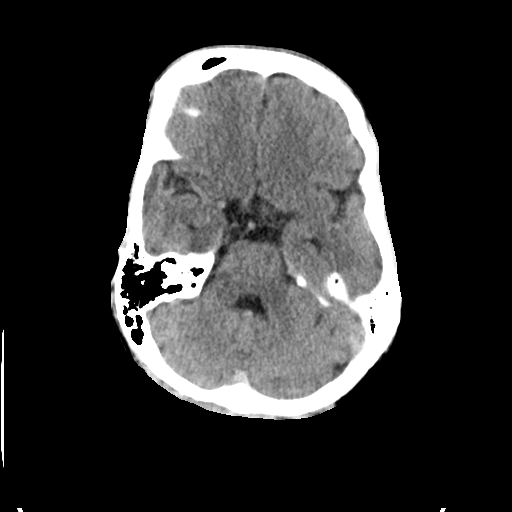
[im 12/32  brain]
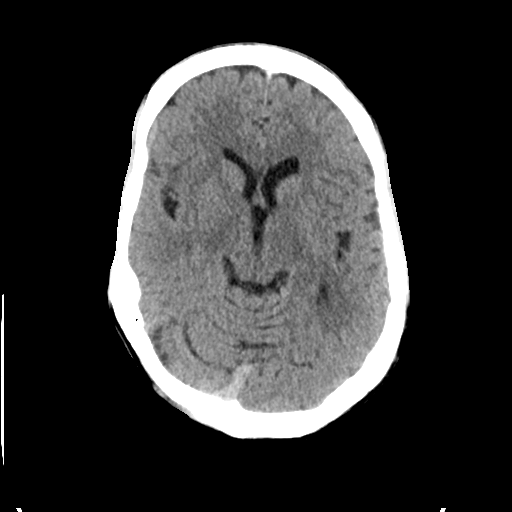
[im 17/32  brain]
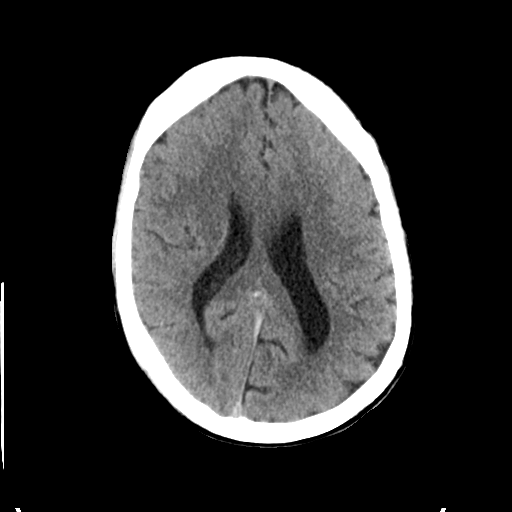
[im 17/32  bone]
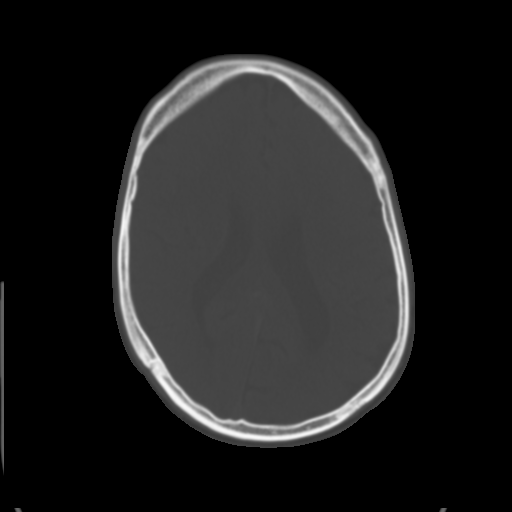
[im 20/32  brain]
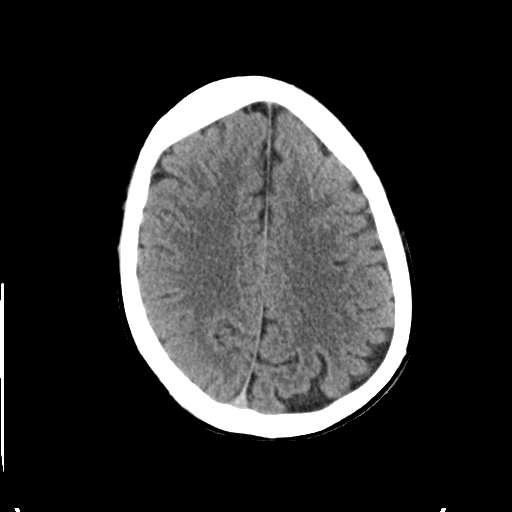
[im 23/32  brain]
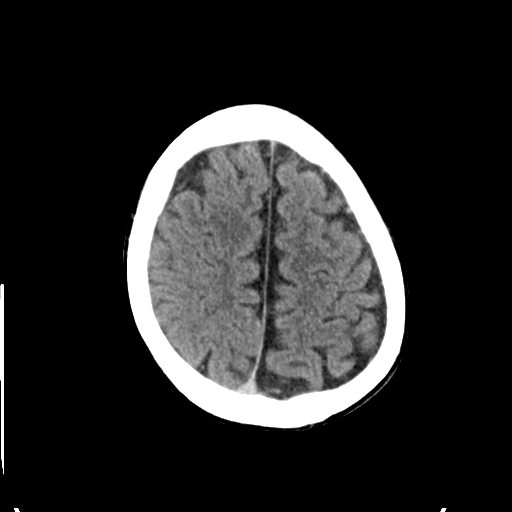
[im 26/32  brain]
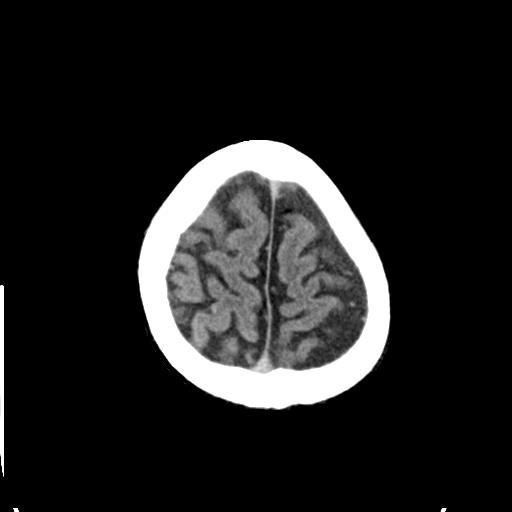
[im 29/32  brain]
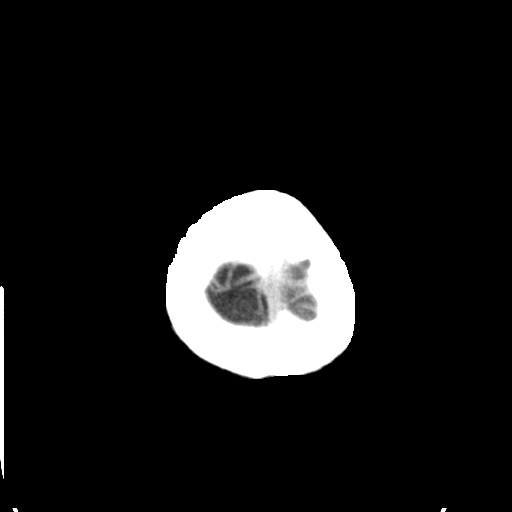
[im 29/32  bone]
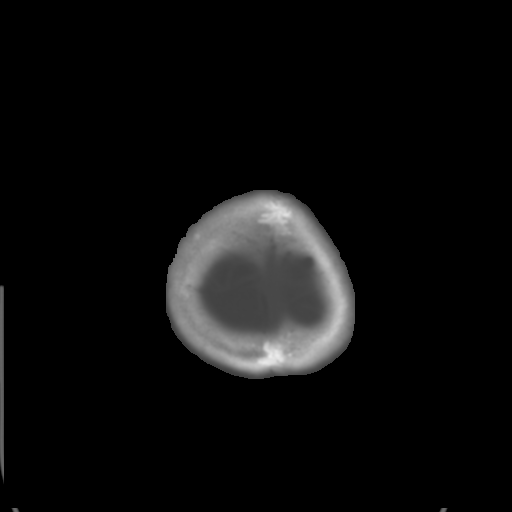

[Series 5: coronal soft tissue · coronal · 0.33mm/px · 3 of 68 slices shown]
[im 23/68  brain]
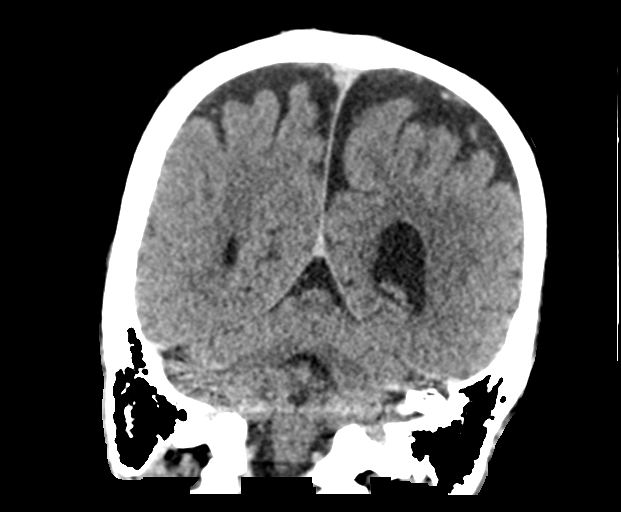
[im 30/68  brain]
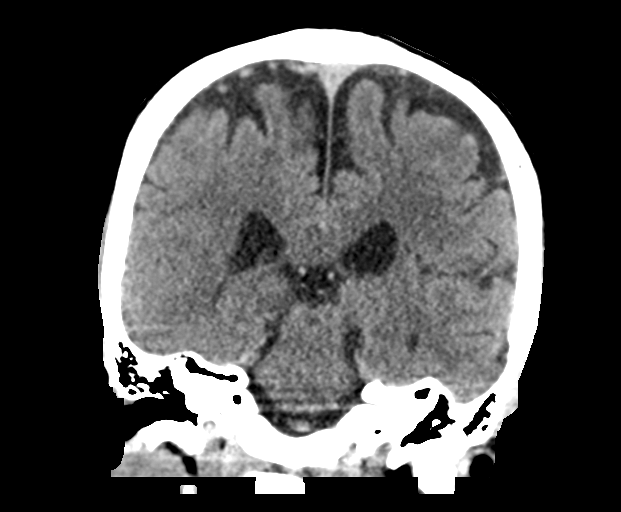
[im 38/68  brain]
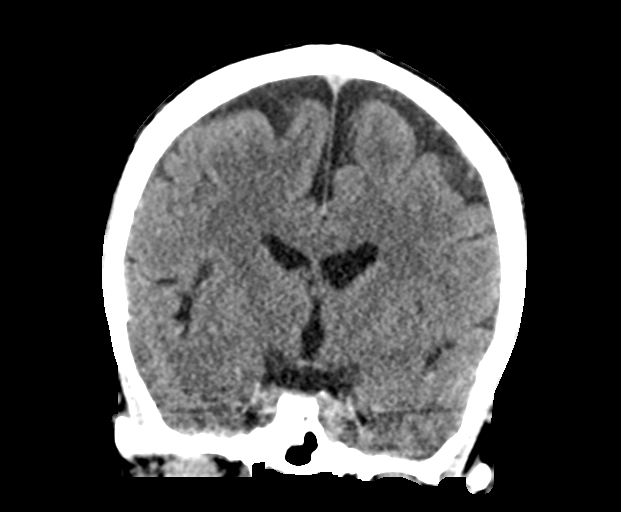

[Series 6: sagittal soft tissue · sagittal · 0.37mm/px · 3 of 53 slices shown]
[im 18/53  brain]
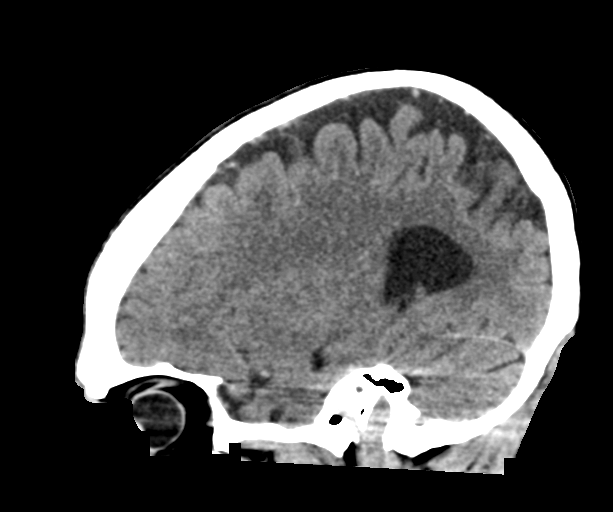
[im 27/53  brain]
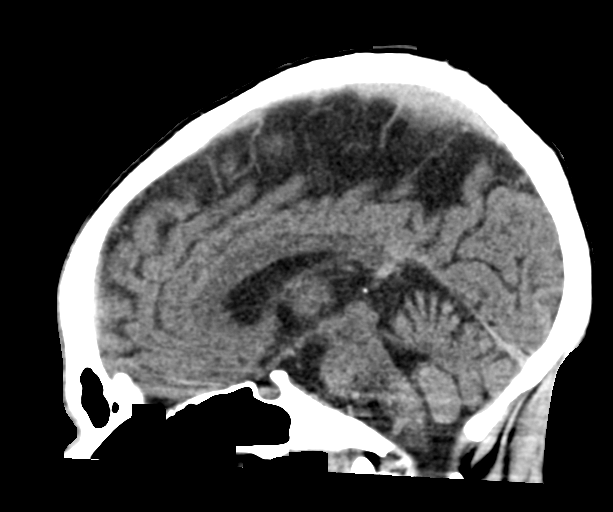
[im 35/53  brain]
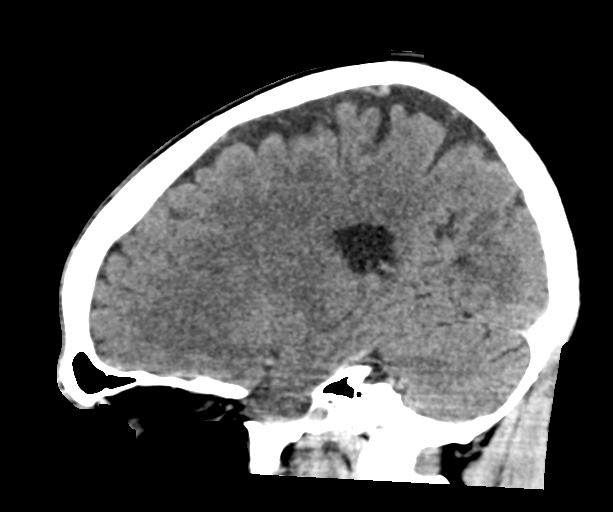

[15 of 47 positions shown; findings below may reference images not displayed]

FINDINGS: CT HEAD FINDINGS

Brain: No evidence of acute infarction, hemorrhage, hydrocephalus,
extra-axial collection or mass lesion/mass effect. Asymmetric
prominence of the left lateral ventricle, this configuration
unchanged compared to prior examinations (series 3, image 16).

Vascular: No hyperdense vessel or unexpected calcification.

Skull: Normal. Negative for fracture or focal lesion.

Other: None.

CT MAXILLOFACIAL FINDINGS

Osseous: No fracture or mandibular dislocation. No destructive
process.

Orbits: Negative. No traumatic or inflammatory finding.

Sinuses: Clear.

Soft tissues: Soft tissue contusion of the right cheek (series 3,
image 36).
IMPRESSION: 1. No acute intracranial pathology.
2. No displaced fracture or dislocation of the facial bones.
3. Soft tissue contusion of the right cheek.

## 2023-02-03 MED ORDER — ZONISAMIDE 100 MG PO CAPS: ORAL_CAPSULE | ORAL | 1 refills | Status: DC

## 2023-02-03 MED ORDER — LAMOTRIGINE 200 MG PO TABS: ORAL_TABLET | ORAL | 1 refills | Status: DC

## 2023-02-03 NOTE — Progress Notes (Signed)
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 He is taking and tolerating Lamictal and Zonisamide for his seizure disorder. There have been problems in the past with lack of insurance affecting medication options as well as problems with his memory and intellect, despite use of written instructions. He has been treated with Vitamin D3 for Vitamin D deficiency.    Today's concerns: I asked Franklin [name] to come in today and bring his medications, because I have been concerned about his ability to follow medication instructions correctly.  I learned today that Franklin [name] has not been taking Briviact because he had trouble getting it from the pharmacy.  He has had no seizures since 01/19/2023 He has not had blood drawn yet as we planned in previous visits.  He has not established care with a PCP as we have previously discussed He asked to see the [facility] again because of ongoing anxiety Franklin [name] has been otherwise generally healthy since he was last seen. No health concerns today other than previously mentioned.  Review of systems: Please see HPI for neurologic and other pertinent review of systems. Otherwise all other systems were reviewed and were negative.  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   Body mass index (BMI) of 20.0-20.9 in adult 08/17/2022   Does not have health insurance 10/24/2021   Adjustment disorder with  mixed anxiety and depressed mood 09/10/2021   Poor sleep 03/01/2021   Generalized anxiety disorder 04/10/2020   Loss of weight 04/10/2020   Seizure (HCC) 05/13/2019   Hypokalemia 05/12/2019   Hypomagnesemia 05/12/2019   Hypocalcemia 05/12/2019   Intellectual delay 03/10/2018   Personal history of prematurity 03/10/2018   Generalized convulsive epilepsy (HCC) 06/14/2013   Long-term use of high-risk medication 06/14/2013     Past Medical History:  Diagnosis Date   Prematurity    Retinopathy of prematurity    Seizures (HCC)     Past medical history comments: See HPI Copied from previous record: When he was in school he was diagnosed with attention deficit disorder and central auditory processing deficit by Dr Franklin [name]. He was tried on Concerta which caused significant emotional disturbance, then on Ritalin, then Adderall which worked well. 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   Birth History: He was born at [redacted] weeks gestation, weighing 1 lb 13 1/2 oz. He required ventilator support for sometime. He reportedly had no CNS hemorrhage. Growth and development was delayed.   Surgical history: Past Surgical History:  Procedure Laterality Date   Central line placement     As premature infant   REFRACTIVE SURGERY     For retinopathy of prematurity     Family history: family history includes Autism in his brother; Diabetes type II in his  father; Hypercholesterolemia in his father; Hypertension in his father; Stroke in his paternal aunt and paternal grandmother.   Social history: Social History   Socioeconomic History   Marital status: Single    Spouse name: Not on file   Number of children: Not on file   Years of education: Not on file    Highest education level: Not on file  Occupational History   Not on file  Tobacco Use   Smoking status: Never    Passive exposure: Current (Cousin smokes in home)   Smokeless tobacco: Never  Vaping Use   Vaping Use: Some days   Substances: Flavoring  Substance and Sexual Activity   Alcohol use: Never   Drug use: Never   Sexual activity: Never  Other Topics Concern   Not on file  Social History Narrative   Parents are divorced. Franklin [name] lives with his father.   Social Determinants of Health   Financial Resource Strain: Low Risk  (05/12/2019)   Overall Financial Resource Strain (CARDIA)    Difficulty of Paying Living Expenses: Not hard at all  Food Insecurity: Food Insecurity Present (05/12/2019)   Hunger Vital Sign    Worried About Running Out of Food in the Last Year: Never true    Ran Out of Food in the Last Year: Sometimes true  Transportation Needs: No Transportation Needs (05/12/2019)   PRAPARE - Administrator, Civil Service (Medical): No    Lack of Transportation (Non-Medical): No  Physical Activity: Sufficiently Active (05/12/2019)   Exercise Vital Sign    Days of Exercise per Week: 7 days    Minutes of Exercise per Session: 30 min  Stress: Stress Concern Present (05/12/2019)   Franklin [name] of Occupational Health - Occupational Stress Questionnaire    Feeling of Stress : To some extent  Social Connections: Socially Isolated (05/12/2019)   Social Connection and Isolation Panel [NHANES]    Frequency of Communication with Friends and Family: Once a week    Frequency of Social Gatherings with Friends and Family: Once a week    Attends Religious Services: Never    Database administrator or Organizations: No    Attends Banker Meetings: Never    Marital Status: Never married  Intimate Partner Violence: Not At Risk (05/12/2019)   Humiliation, Afraid, Rape, and Kick questionnaire    Fear of Current or Ex-Partner: No    Emotionally Abused: No     Physically Abused: No    Sexually Abused: No    Past/failed meds: Copied from previous record: Levetiracetam - did not control seizures Fycompa - did not control seizures and made him dizzy Lamotrigine IR - dizziness Oxtellar XR - cost Briviact - had difficulty getting it from the pharmacy  Allergies: Allergies  Allergen Reactions   Concerta [Methylphenidate] Other (See Comments)    Would start crying for little things like when someone coughed or said a specific word for example.      Immunizations: Immunization History  Administered Date(s) Administered   PFIZER(Purple Top)SARS-COV-2 Vaccination 12/08/2019, 01/01/2020   Tdap 06/30/2015, 09/14/2018, 02/18/2022   Diagnostics/Screenings: Copied from previous record: 09/14/2022 MRI Brain wo contrast - 1. No acute brain finding. 2. 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 3. Question mild volume loss and increased FLAIR signal within the hippocampus on the left compared to the right. I am not sure this is definite, but could indicate  subtle mesial temporal sclerosis on the left. Relative asymmetric prominence of the occipital horn of the left lateral ventricle but without evidence of definable focal abnormality of the left occipital lobe.   07/03/2022 - rEEG - This is a abnormal record with the patient in awake and drowsy states due to generalized slowing.  No evidence of decreased seizure threshold, however does not rule out epilepsy.  Clinical correlation advised. Lorenz Coaster MD MPH  Physical Exam: BP 110/70 (BP Location: Left Arm, Patient Position: Sitting, Cuff Size: Small)   Pulse 88   Ht 5' 9.09" (1.755 m)   Wt 135 lb (61.2 kg)   BMI 19.88 kg/m   General: Thin but well developed, well nourished young man, seated on exam table, in no evident distress Head: Head normocephalic and atraumatic.  Oropharynx benign. Neck:  Supple Cardiovascular: Regular rate and rhythm, no murmurs Respiratory: Breath sounds clear to auscultation Musculoskeletal: No obvious deformities or scoliosis Skin: No rashes or neurocutaneous lesions  Neurologic Exam Mental Status: Awake and fully alert.  Oriented to place and time. Attention span, concentration, and fund of knowledge subnormal for age.  Mood and affect appropriate. Cranial Nerves: Fundoscopic exam reveals sharp disc margins.  Pupils equal, briskly reactive to light.  Extraocular movements full without nystagmus. Hearing intact and symmetric to whisper.  Facial sensation intact.  Face tongue, palate move normally and symmetrically. Shoulder shrug normal Motor: Normal bulk and tone. Normal strength in all tested extremity muscles. Sensory: Intact to touch and temperature in all extremities.  Coordination: Rapid alternating movements normal in all extremities.  Finger-to-nose and heel-to shin performed accurately bilaterally.  Romberg negative. Gait and Station: Arises from chair without difficulty.  Stance is normal. Gait demonstrates normal stride length and balance.   Able to heel, toe and tandem walk without difficulty. Reflexes: 1+ and symmetric. Toes downgoing.   Impression: Generalized convulsive epilepsy (HCC) - Plan: zonisamide (ZONEGRAN) 100 MG capsule, lamoTRIgine (LAMICTAL) 200 MG tablet, Lamotrigine level, Lamotrigine level, VITAMIN D 25 Hydroxy (Vit-D Deficiency, Fractures)  Long-term use of high-risk medication - Plan: Lamotrigine level, Lamotrigine level, VITAMIN D 25 Hydroxy (Vit-D Deficiency, Fractures)  Intellectual delay - Plan: VITAMIN D 25 Hydroxy (Vit-D Deficiency, Fractures)  Vitamin D deficiency - Plan: VITAMIN D 25 Hydroxy (Vit-D Deficiency, Fractures)  Generalized anxiety disorder  Focal epilepsy (HCC)   Recommendations for plan of care: The patient's previous Epic records were reviewed. No recent diagnostic studies to be reviewed with the  patient. I talked with Jarquise and his father. I hope that the current regimen of medication will be easier for him to follow and have fewer side effects. If he continues to experience breakthrough seizures, I will increase Zonisamide first, as it is at a low dose.  Plan until next visit: Continue medications as prescribed  Reminded to get blood drawn for Vitamin D level and Lamotrigine level Reminded to call PCP office to establish care. Referral was placed several months ago.  Call to report seizures when they occur I will ask the [facility] about seeing him again or will refer him to outside therapist Return in about 3 weeks (around 02/24/2023).  The medication list was reviewed and reconciled. No changes were made in the prescribed medications today. A complete medication list was provided to the patient.  Orders Placed This Encounter  Procedures   Lamotrigine level    Random level    Standing Status:   Future    Number of Occurrences:   1  Standing Expiration Date:   02/03/2024     Allergies as of 02/03/2023       Reactions   Concerta [methylphenidate] Other (See Comments)   Would start crying for little things like when someone coughed or said a specific word for example.         Medication List        Accurate as of Feb 03, 2023 11:59 PM. If you have any questions, ask your nurse or doctor.          STOP taking these medications    Briviact 100 MG Tabs tablet Generic drug: brivaracetam Stopped by: Franklin Rising, NP   Briviact 50 MG Tabs Generic drug: Brivaracetam Stopped by: Franklin Rising, NP   Vitamin D (Ergocalciferol) 1.25 MG (50000 UNIT) Caps capsule Commonly known as: DRISDOL Stopped by: Franklin Rising, NP       TAKE these medications    acetaminophen 325 MG tablet Commonly known as: TYLENOL Take 2 tablets (650 mg total) by mouth every 4 (four) hours as needed for mild pain (or temp >/= 99.5).   aspirin EC 325 MG  tablet Take 325 mg by mouth daily.   lamoTRIgine 200 MG tablet Commonly known as: LaMICtal Take 1 tablet in the morning and take 2 tablets at night What changed: Another medication with the same name was removed. Continue taking this medication, and follow the directions you see here. Changed by: Franklin Rising, NP   zonisamide 100 MG capsule Commonly known as: ZONEGRAN Take 1 capsule at bedtime      Total time spent with the patient was 30 minutes, of which 50% or more was spent in counseling and coordination of care.  Franklin Rising NP-C [facility] [address] [place], [place] [address] Ph. [phone] Fax [phone]

## 2023-02-03 NOTE — Patient Instructions (Addendum)
It was a pleasure to see you today!  Instructions for you until your next appointment are as follows: Continue taking the Lamotrigine 200mg  tablets - 1 in the morning and 2 at night Continue taking the Zonisamide 100mg  capsules - 1 at night Give the Lamictal XR bottles to your mother to keep for now. Remember, don't take any more of the Lamictal XR tablets.  Try not to miss any doses of medication. Let me know if seizures occur.  Be sure to get your blood drawn to check the Vitamin D level as we discussed - the address is [address], [address] (third floor) if you go today Call the PCP office to schedule an appointment - [phone] Please sign up for MyChart if you have not done so. Please plan to return for follow up in 3 weeks or sooner if needed. Bring your medicine bottles when you come back in 3 weeks.  I will check with Gasper Lloyd to see if she can see you again. If not, I will let you know how to get established with a therapist.   Feel free to contact our office during normal business hours at [phone] with questions or concerns. If there is no answer or the call is outside business hours, please leave a message and our clinic staff will call you back within the next business day.  If you have an urgent concern, please stay on the line for our after-hours answering service and ask for the on-call neurologist.     I also encourage you to use MyChart to communicate with me more directly. If you have not yet signed up for MyChart within [facility], the front desk staff can help you. However, please note that this inbox is NOT monitored on nights or weekends, and response can take up to 2 business days.  Urgent matters should be discussed with the on-call pediatric neurologist.   [facility], we are committed to providing exceptional care. You will receive a patient satisfaction survey through text or email regarding your visit today. Your opinion is important to me.  Comments are appreciated.

## 2023-02-05 ENCOUNTER — Encounter (INDEPENDENT_AMBULATORY_CARE_PROVIDER_SITE_OTHER): Payer: Self-pay | Admitting: Family

## 2023-02-05 LAB — VITAMIN D 25 HYDROXY (VIT D DEFICIENCY, FRACTURES): Vit D, 25-Hydroxy: 50 ng/mL (ref 30–100)

## 2023-02-05 LAB — LAMOTRIGINE LEVEL: Lamotrigine Lvl: 11.8 ug/mL (ref 2.5–15.0)

## 2023-03-30 NOTE — Progress Notes (Unsigned)
Franklin [name]   MRN:  [id number]  1995/11/15   Provider: Elveria Rising NP-C Location of Care: 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 He is taking and tolerating Lamictal and Zonisamide for his seizure disorder. There have been problems in the past with lack of insurance affecting medication options as well as problems with his memory and intellect, despite use of written instructions. He has been treated with Vitamin D3 for Vitamin D deficiency.    Today's concerns:  Franklin [name] has been otherwise generally healthy since he was last seen. No health concerns today other than previously mentioned.  Review of systems: Please see HPI for neurologic and other pertinent review of systems. Otherwise all other systems were reviewed and were negative.  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   Body mass index (BMI) of 20.0-20.9 in adult 08/17/2022   Does not have health insurance 10/24/2021   Adjustment disorder with mixed anxiety and depressed mood 09/10/2021   Poor sleep 03/01/2021   Generalized anxiety disorder 04/10/2020   Loss of weight 04/10/2020   Seizure (HCC) 05/13/2019   Hypokalemia 05/12/2019   Hypomagnesemia 05/12/2019   Hypocalcemia 05/12/2019   Intellectual delay 03/10/2018   Personal history of prematurity 03/10/2018   Generalized convulsive epilepsy (HCC) 06/14/2013   Long-term use of high-risk medication 06/14/2013     Past Medical History:  Diagnosis Date   Prematurity    Retinopathy of prematurity    Seizures  (HCC)     Past medical history comments: See HPI Copied from previous record: When he was in school he was diagnosed with attention 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 He was tried on Concerta which caused significant emotional disturbance, then on Ritalin, then Adderall which worked well. 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   Birth History: He was born at [redacted] weeks gestation, weighing 1 lb 13 1/2 oz. He required ventilator support for sometime. He reportedly had no CNS hemorrhage. Growth and development was delayed.   Surgical history: Past Surgical History:  Procedure Laterality Date   Central line placement     As premature infant   REFRACTIVE SURGERY     For retinopathy of prematurity     Family history: family history includes Autism in his brother; Diabetes type II in his father; Hypercholesterolemia in his father; Hypertension in his father; Stroke in his paternal aunt and paternal grandmother.   Social history: Social History   Socioeconomic History   Marital status: Single    Spouse name: Not on file   Number of children: Not on file   Years of education: Not on file   Highest education level: Not on file  Occupational History   Not on file  Tobacco Use   Smoking status: Never    Passive exposure: Current (Cousin smokes in home)  Smokeless tobacco: Never  Vaping Use   Vaping status: Some Days   Substances: Flavoring  Substance and Sexual Activity   Alcohol use: Never   Drug use: Never   Sexual activity: Never  Other Topics Concern   Not on file  Social History Narrative   Parents are divorced. Franklin [name] lives with his father.   Social Determinants of Health   Financial Resource Strain:  Low Risk  (05/12/2019)   Overall Financial Resource Strain (CARDIA)    Difficulty of Paying Living Expenses: Not hard at all  Food Insecurity: Food Insecurity Present (05/12/2019)   Hunger Vital Sign    Worried About Running Out of Food in the Last Year: Never true    Ran Out of Food in the Last Year: Sometimes true  Transportation Needs: No Transportation Needs (05/12/2019)   PRAPARE - Administrator, Civil Service (Medical): No    Lack of Transportation (Non-Medical): No  Physical Activity: Sufficiently Active (05/12/2019)   Exercise Vital Sign    Days of Exercise per Week: 7 days    Minutes of Exercise per Session: 30 min  Stress: Stress Concern Present (05/12/2019)   Franklin [name] [employer]ational [employer] - Occupational Stress Questionnaire    Feeling of Stress : To some extent  Social Connections: Socially Isolated (05/12/2019)   Social Connection and Isolation Panel [NHANES]    Frequency of Communication with Friends and Family: Once a week    Frequency of Social Gatherings with Friends and Family: Once a week    Attends Religious Services: Never    Database administrator or Organizations: No    Attends Banker Meetings: Never    Marital Status: Never married  Intimate Partner Violence: Not At Risk (05/12/2019)   Humiliation, Afraid, Rape, and Kick questionnaire    Fear of Current or Ex-Partner: No    Emotionally Abused: No    Physically Abused: No    Sexually Abused: No    Past/failed meds: Copied from previous record: Levetiracetam - did not control seizures Fycompa - did not control seizures and made him dizzy Lamotrigine IR - dizziness Oxtellar XR - cost Briviact - had difficulty getting it from the pharmacy  Allergies: Allergies  Allergen Reactions   Concerta [Methylphenidate] Other (See Comments)    Would start crying for little things like when someone coughed or said a specific word for example.     Immunizations: Immunization History   Administered Date(s) Administered   PFIZER(Purple Top)SARS-COV-2 Vaccination 12/08/2019, 01/01/2020   Tdap 06/30/2015, 09/14/2018, 02/18/2022    Diagnostics/Screenings: Copied from previous record: 09/14/2022 MRI Brain wo contrast - 1. No acute brain finding. 2. 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 3. Question mild volume loss and increased FLAIR signal within the hippocampus on the left compared to the right. I am not sure this is definite, but could indicate subtle mesial temporal sclerosis on the left. Relative asymmetric prominence of the occipital horn of the left lateral ventricle but without evidence of definable focal abnormality of the left occipital lobe.   07/03/2022 - rEEG - This is a abnormal record with the patient in awake and drowsy states due to generalized slowing.  No evidence of decreased seizure threshold, however does not rule out epilepsy.  Clinical correlation advised. Lorenz Coaster MD MPH  Physical Exam: There were no vitals taken for this visit.  Wt Readings from Last 3  Encounters:  02/03/23 135 lb (61.2 kg)  01/25/23 133 lb 9.6 oz (60.6 kg)  09/23/22 134 lb (60.8 kg)  General: Well developed, well nourished, seated, in no evident distress Head: Head normocephalic and atraumatic.  Oropharynx benign. Neck: Supple Cardiovascular: Regular rate and rhythm, no murmurs Respiratory: Breath sounds clear to auscultation Musculoskeletal: No obvious deformities or scoliosis Skin: No rashes or neurocutaneous lesions  Neurologic Exam Mental Status: Awake and fully alert.  Oriented to place and time.  Recent and remote memory intact.  Attention span, concentration, and fund of knowledge appropriate.  Mood and affect appropriate. Cranial Nerves: Fundoscopic exam reveals sharp disc margins.  Pupils equal, briskly reactive to light.  Extraocular movements full without  nystagmus. Hearing intact and symmetric to whisper.  Facial sensation intact.  Face tongue, palate move normally and symmetrically. Shoulder shrug normal Motor: Normal bulk and tone. Normal strength in all tested extremity muscles. Sensory: Intact to touch and temperature in all extremities.  Coordination: Rapid alternating movements normal in all extremities.  Finger-to-nose and heel-to shin performed accurately bilaterally.  Romberg negative. Gait and Station: Arises from chair without difficulty.  Stance is normal. Gait demonstrates normal stride length and balance.   Able to heel, toe and tandem walk without difficulty. Reflexes: 1+ and symmetric. Toes downgoing.   Impression: Generalized convulsive epilepsy (HCC)  Intellectual delay  Long-term use of high-risk medication  Generalized anxiety disorder  Focal epilepsy (HCC)    Recommendations for plan of care: The patient's previous Epic records were reviewed. No recent diagnostic studies to be reviewed with the patient.  Plan until next visit: Continue medications as prescribed  Call for questions or concerns No follow-ups on file.  The medication list was reviewed and reconciled. No changes were made in the prescribed medications today. A complete medication list was provided to the patient.  No orders of the defined types were placed in this encounter.  Allergies as of 03/31/2023       Reactions   Concerta [methylphenidate] Other (See Comments)   Would start crying for little things like when someone coughed or said a specific word for example.         Medication List        Accurate as of March 30, 2023  7:15 PM. If you have any questions, ask your nurse or doctor.          acetaminophen 325 MG tablet Commonly known as: TYLENOL Take 2 tablets (650 mg total) by mouth every 4 (four) hours as needed for mild pain (or temp >/= 99.5).   aspirin EC 325 MG tablet Take 325 mg by mouth daily.   lamoTRIgine 200 MG  tablet Commonly known as: LaMICtal Take 1 tablet in the morning and take 2 tablets at night   zonisamide 100 MG capsule Commonly known as: ZONEGRAN Take 1 capsule at bedtime      Total time spent with the patient was *** minutes, of which 50% or more was spent in counseling and coordination of care.  Franklin Rising NP-C [facility] [address], [address] [place], [place] [address] Ph. [phone] Fax [phone]

## 2023-03-31 ENCOUNTER — Ambulatory Visit (INDEPENDENT_AMBULATORY_CARE_PROVIDER_SITE_OTHER): Payer: Medicaid Other | Admitting: Family

## 2023-03-31 DIAGNOSIS — F819 Developmental disorder of scholastic skills, unspecified: Secondary | ICD-10-CM

## 2023-03-31 DIAGNOSIS — G40309 Generalized idiopathic epilepsy and epileptic syndromes, not intractable, without status epilepticus: Secondary | ICD-10-CM

## 2023-03-31 DIAGNOSIS — F411 Generalized anxiety disorder: Secondary | ICD-10-CM

## 2023-03-31 DIAGNOSIS — Z79899 Other long term (current) drug therapy: Secondary | ICD-10-CM

## 2023-03-31 DIAGNOSIS — G40109 Localization-related (focal) (partial) symptomatic epilepsy and epileptic syndromes with simple partial seizures, not intractable, without status epilepticus: Secondary | ICD-10-CM

## 2023-04-16 ENCOUNTER — Telehealth (INDEPENDENT_AMBULATORY_CARE_PROVIDER_SITE_OTHER): Payer: Self-pay | Admitting: Family

## 2023-04-16 DIAGNOSIS — G40309 Generalized idiopathic epilepsy and epileptic syndromes, not intractable, without status epilepticus: Secondary | ICD-10-CM

## 2023-04-16 MED ORDER — ZONISAMIDE 100 MG PO CAPS
ORAL_CAPSULE | ORAL | 1 refills | Status: DC
Start: 2023-04-16 — End: 2023-05-21

## 2023-04-16 NOTE — Telephone Encounter (Signed)
Tried calling dad back at number but number would not go through

## 2023-04-16 NOTE — Telephone Encounter (Signed)
  Name of who is calling: Mendel Ryder   Caller's Relationship to Patient: Dad  Best contact number: [phone]  Provider they see: Elveria Rising   Reason for call: Just called to let Inetta Fermo know Jshon has had a couple seizures today. He would like a call back regarding that.     PRESCRIPTION REFILL ONLY  Name of prescription:  Pharmacy:

## 2023-04-16 NOTE — Telephone Encounter (Signed)
I called Dad back. He said that Franklin [name] has been compliant with medication. I recommended increasing the Zonisamide dose to 2 at bedtime. Dad agreed with this plan. TG

## 2023-04-30 ENCOUNTER — Telehealth (INDEPENDENT_AMBULATORY_CARE_PROVIDER_SITE_OTHER): Payer: Self-pay | Admitting: Family

## 2023-04-30 ENCOUNTER — Other Ambulatory Visit (INDEPENDENT_AMBULATORY_CARE_PROVIDER_SITE_OTHER): Payer: Self-pay | Admitting: Family

## 2023-04-30 DIAGNOSIS — G40309 Generalized idiopathic epilepsy and epileptic syndromes, not intractable, without status epilepticus: Secondary | ICD-10-CM

## 2023-04-30 NOTE — Telephone Encounter (Signed)
I called and spoke with Dad. He said that Franklin [name] had been out of Lamotrigine for a few days and had a seizure. When he discovered that he was out, he gave him some of the old Lamotrigine ER that he used to take. The Lamotrigine was refilled today. I encouraged him to help Paulos to get back on track and to check his pillbox each week. Romelle has an appointment with me on September 11 and I will follow up at that time. TG

## 2023-04-30 NOTE — Telephone Encounter (Signed)
  Name of who is calling: Roger Shelter Relationship to Patient: dad  Best contact number: [phone]  Provider they see: Elveria Rising   Reason for call: Calling to inform Inetta Fermo that Matai has had another seizure      PRESCRIPTION REFILL ONLY  Name of prescription:  Pharmacy:

## 2023-05-18 NOTE — Patient Instructions (Incomplete)
It was a pleasure to see you today!  Instructions for you until your next appointment are as follows:  Please sign up for MyChart if you have not done so. Please plan to return for follow up in or sooner if needed.    Feel free to contact our office during normal business hours at [phone] with questions or concerns. If there is no answer or the call is outside business hours, please leave a message and our clinic staff will call you back within the next business day.  If you have an urgent concern, please stay on the line for our after-hours answering service and ask for the on-call neurologist.     I also encourage you to use MyChart to communicate with me more directly. If you have not yet signed up for MyChart within [facility], the front desk staff can help you. However, please note that this inbox is NOT monitored on nights or weekends, and response can take up to 2 business days.  Urgent matters should be discussed with the on-call pediatric neurologist.   At [facility], we are committed to providing exceptional care. You will receive a patient satisfaction survey through text or email regarding your visit today. Your opinion is important to me. Comments are appreciated.   

## 2023-05-18 NOTE — Progress Notes (Signed)
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 He is taking and tolerating Lamictal and Zonisamide for his seizure disorder. There have been problems in the past with lack of insurance affecting medication options as well as problems with his memory and intellect, despite use of written instructions. He has been treated with Vitamin D3 for Vitamin D deficiency.    Today's concerns: He continues to experience fairly frequent breakthrough seizures, usually related to missed medications or lack of sleep [name] has been using a pill box to help reduce missed doses. Dad has been monitoring the pill box as well.  He has not yet scheduled an appointment with the PCP as recommended.  He has not gotten established with a therapist for anxiety. [name] has been otherwise generally healthy since he was last seen. No health concerns today other than previously mentioned.  Review of systems: Please see HPI for neurologic and other pertinent review of systems. Otherwise all other systems were reviewed and were negative.  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   Body mass index (BMI) of 20.0-20.9 in adult 08/17/2022   Does not have health insurance 10/24/2021   Adjustment disorder with mixed anxiety and depressed mood 09/10/2021   Poor sleep 03/01/2021   Generalized anxiety disorder 04/10/2020   Loss of weight 04/10/2020   Seizure (HCC) 05/13/2019    Hypokalemia 05/12/2019   Hypomagnesemia 05/12/2019   Hypocalcemia 05/12/2019   Intellectual delay 03/10/2018   Personal history of prematurity 03/10/2018   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 He was tried on Concerta which caused significant emotional disturbance, then on Ritalin, then Adderall which worked well. He has mild hearing loss. 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   Birth History: He was born at [redacted] weeks gestation, weighing 1 lb 13 1/2 oz. He required ventilator support for sometime. He reportedly had no CNS hemorrhage. Growth and development was delayed.   Surgical history: Past Surgical History:  Procedure Laterality Date   Central line placement     As premature infant   REFRACTIVE SURGERY     For retinopathy of prematurity    Family history: family history includes Autism in his brother; Diabetes type II in his father; Hypercholesterolemia in his father; Hypertension in his father; Stroke in his paternal aunt and paternal grandmother.   Social history: Social History   Socioeconomic History   Marital status: Single  Spouse name: Not on file   Number of children: Not on file   Years of education: Not on file   Highest education level: Not on file  Occupational History   Not on file  Tobacco Use   Smoking status: Never    Passive exposure: Current (Cousin smokes in home)   Smokeless  tobacco: Never  Vaping Use   Vaping status: Some Days   Substances: Flavoring  Substance and Sexual Activity   Alcohol use: Never   Drug use: Never   Sexual activity: Never  Other Topics Concern   Not on file  Social History Narrative   Parents are divorced. Franklin [name] lives with his father.   Social Determinants of Health   Financial Resource Strain: Low Risk  (05/12/2019)   Overall Financial Resource Strain (CARDIA)    Difficulty of Paying Living Expenses: Not hard at all  Food Insecurity: Food Insecurity Present (05/12/2019)   Hunger Vital Sign    Worried About Running Out of Food in the Last Year: Never true    Ran Out of Food in the Last Year: Sometimes true  Transportation Needs: No Transportation Needs (05/12/2019)   PRAPARE - Administrator, Civil Service (Medical): No    Lack of Transportation (Non-Medical): No  Physical Activity: Sufficiently Active (05/12/2019)   Exercise Vital Sign    Days of Exercise per Week: 7 days    Minutes of Exercise per Session: 30 min  Stress: Stress Concern Present (05/12/2019)   [name] of Occupational Health - Occupational Stress Questionnaire    Feeling of Stress : To some extent  Social Connections: Socially Isolated (05/12/2019)   Social Connection and Isolation Panel [NHANES]    Frequency of Communication with Friends and Family: Once a week    Frequency of Social Gatherings with Friends and Family: Once a week    Attends Religious Services: Never    Database administrator or Organizations: No    Attends Banker Meetings: Never    Marital Status: Never married  Intimate Partner Violence: Not At Risk (05/12/2019)   Humiliation, Afraid, Rape, and Kick questionnaire    Fear of Current or Ex-Partner: No    Emotionally Abused: No    Physically Abused: No    Sexually Abused: No    Past/failed meds: Copied from previous record: Levetiracetam - did not control seizures Fycompa - did not control seizures and made  him dizzy Lamotrigine IR - dizziness Oxtellar XR - cost Briviact - had difficulty getting it from the pharmacy  Allergies: Allergies  Allergen Reactions   Concerta [Methylphenidate] Other (See Comments)    Would start crying for little things like when someone coughed or said a specific word for example.     Immunizations: Immunization History  Administered Date(s) Administered   PFIZER(Purple Top)SARS-COV-2 Vaccination 12/08/2019, 01/01/2020   Tdap 06/30/2015, 09/14/2018, 02/18/2022    Diagnostics/Screenings: Copied from previous record: 09/14/2022 MRI Brain wo contrast - 1. No acute brain finding. 2. 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 3. Question mild volume loss and increased FLAIR signal within the hippocampus on the left compared to the right. I am not sure this is definite, but could indicate subtle mesial temporal sclerosis on the left. Relative asymmetric prominence of the occipital horn of the left lateral ventricle but without evidence of definable focal abnormality of the left occipital lobe.   07/03/2022 -  rEEG - This is a abnormal record with the patient in awake and drowsy states due to generalized slowing.  No evidence of decreased seizure threshold, however does not rule out epilepsy.  Clinical correlation advised. Lorenz Coaster MD MPH  Physical Exam: BP 110/84 (BP Location: Left Arm, Patient Position: Sitting, Cuff Size: Normal)   Pulse (!) 108 Comment: Taken twice  Ht 5' 8.23" (1.733 m)   Wt 131 lb 6.4 oz (59.6 kg)   BMI 19.85 kg/m   General: Well developed, well nourished young man, seated on exam table, in no evident distress Head: Head normocephalic and atraumatic.  Oropharynx benign. Neck: Supple Cardiovascular: Regular rate and rhythm, no murmurs Respiratory: Breath sounds clear to auscultation Musculoskeletal: No obvious deformities or scoliosis Skin:  No rashes or neurocutaneous lesions  Neurologic Exam Mental Status: Awake and fully alert.  Oriented to place and time. Attention span, concentration, and fund of knowledge subnormal for age.  Mood and affect appropriate. Cranial Nerves: Fundoscopic exam reveals sharp disc margins.  Pupils equal, briskly reactive to light.  Extraocular movements full without nystagmus. Hearing intact and symmetric to whisper.  Facial sensation intact.  Face tongue, palate move normally and symmetrically. Shoulder shrug normal Motor: Normal bulk and tone. Normal strength in all tested extremity muscles. Sensory: Intact to touch and temperature in all extremities.  Coordination: Rapid alternating movements normal in all extremities.  Finger-to-nose and heel-to shin performed accurately bilaterally.  Romberg negative. Gait and Station: Arises from chair without difficulty.  Stance is normal. Gait demonstrates normal stride length and balance.   Able to heel, toe and tandem walk without difficulty. Reflexes: 1+ and symmetric. Toes downgoing.   Impression: Generalized convulsive epilepsy (HCC) - Plan: zonisamide (ZONEGRAN) 100 MG capsule  Long-term use of high-risk medication  Intellectual delay  Generalized anxiety disorder  Focal epilepsy (HCC)  Poor sleep   Recommendations for plan of care: The patient's previous Epic records were reviewed. No recent diagnostic studies to be reviewed with the patient.  Plan until next visit: Continue medications as prescribed  Call and schedule appointment with PCP Needs Vitamin D level checked in October Get established with a therapist.  Continue to report when seizures occur. Call for questions or concerns Return in about 3 months (around 08/18/2023).  The medication list was reviewed and reconciled. No changes were made in the prescribed medications today. A complete medication list was provided to the patient.  Allergies as of 05/19/2023       Reactions    Concerta [methylphenidate] Other (See Comments)   Would start crying for little things like when someone coughed or said a specific word for example.         Medication List        Accurate as of May 19, 2023 11:59 PM. If you have any questions, ask your nurse or doctor.          acetaminophen 325 MG tablet Commonly known as: TYLENOL Take 2 tablets (650 mg total) by mouth every 4 (four) hours as needed for mild pain (or temp >/= 99.5).   aspirin EC 325 MG tablet Take 325 mg by mouth daily.   lamoTRIgine 200 MG tablet Commonly known as: LAMICTAL TAKE 1 TABLET BY MOUTH IN THE MORNING AND 2 AT NIGHT   zonisamide 100 MG capsule Commonly known as: ZONEGRAN Take 2 capsules at bedtime      Total time spent with the patient was 20 minutes, of which 50% or more was spent in  counseling and coordination of care.  Elveria Rising NP-C [facility] [address], [address] [place], [place] [address] Ph. [phone] Fax [phone]

## 2023-05-19 ENCOUNTER — Encounter (INDEPENDENT_AMBULATORY_CARE_PROVIDER_SITE_OTHER): Payer: Self-pay | Admitting: Family

## 2023-05-19 ENCOUNTER — Ambulatory Visit (INDEPENDENT_AMBULATORY_CARE_PROVIDER_SITE_OTHER): Payer: Medicaid Other | Admitting: Family

## 2023-05-19 VITALS — BP 110/84 | HR 108 | Ht 68.23 in | Wt 131.4 lb

## 2023-05-19 DIAGNOSIS — G40109 Localization-related (focal) (partial) symptomatic epilepsy and epileptic syndromes with simple partial seizures, not intractable, without status epilepticus: Secondary | ICD-10-CM

## 2023-05-19 DIAGNOSIS — F819 Developmental disorder of scholastic skills, unspecified: Secondary | ICD-10-CM

## 2023-05-19 DIAGNOSIS — Z72820 Sleep deprivation: Secondary | ICD-10-CM

## 2023-05-19 DIAGNOSIS — G40309 Generalized idiopathic epilepsy and epileptic syndromes, not intractable, without status epilepticus: Secondary | ICD-10-CM | POA: Diagnosis not present

## 2023-05-19 DIAGNOSIS — F411 Generalized anxiety disorder: Secondary | ICD-10-CM | POA: Diagnosis not present

## 2023-05-19 DIAGNOSIS — Z79899 Other long term (current) drug therapy: Secondary | ICD-10-CM

## 2023-05-21 ENCOUNTER — Encounter (INDEPENDENT_AMBULATORY_CARE_PROVIDER_SITE_OTHER): Payer: Self-pay | Admitting: Family

## 2023-05-21 MED ORDER — ZONISAMIDE 100 MG PO CAPS
ORAL_CAPSULE | ORAL | 5 refills | Status: DC
Start: 2023-05-21 — End: 2023-06-03

## 2023-06-03 ENCOUNTER — Encounter (INDEPENDENT_AMBULATORY_CARE_PROVIDER_SITE_OTHER): Payer: Self-pay

## 2023-06-03 ENCOUNTER — Telehealth (INDEPENDENT_AMBULATORY_CARE_PROVIDER_SITE_OTHER): Payer: Self-pay | Admitting: Family

## 2023-06-03 DIAGNOSIS — G40309 Generalized idiopathic epilepsy and epileptic syndromes, not intractable, without status epilepticus: Secondary | ICD-10-CM

## 2023-06-03 MED ORDER — ZONISAMIDE 100 MG PO CAPS
ORAL_CAPSULE | ORAL | 5 refills | Status: DC
Start: 2023-06-03 — End: 2023-08-18

## 2023-06-03 NOTE — Telephone Encounter (Signed)
Dad called to report that Franklin [name] had a seizure last night. He said that it was convulsive, lasted about 1 minute and that he had incontinence of urine with the seizure. I instructed him to increase the Zonisamide to 3 capsules at bedtime. TG

## 2023-07-05 ENCOUNTER — Telehealth (INDEPENDENT_AMBULATORY_CARE_PROVIDER_SITE_OTHER): Payer: Self-pay | Admitting: Family

## 2023-07-05 NOTE — Telephone Encounter (Signed)
I spoke with Dad about [place]. He said that Franklin [name] missed a few days of seizure medications and had a seizure today. Dad is helping him to get the medication refilled and then put in his pill boxes. I also spoke with Mom who asked about how to get a therapist for [place]. I reviewed how to do so with Mom. TG

## 2023-07-20 ENCOUNTER — Encounter (INDEPENDENT_AMBULATORY_CARE_PROVIDER_SITE_OTHER): Payer: Self-pay

## 2023-08-14 ENCOUNTER — Encounter: Payer: Self-pay | Admitting: Pharmacy Technician

## 2023-08-14 ENCOUNTER — Other Ambulatory Visit: Payer: Self-pay

## 2023-08-14 ENCOUNTER — Emergency Department
Admission: EM | Admit: 2023-08-14 | Discharge: 2023-08-14 | Disposition: A | Discharge status: Home / Self Care | Payer: Medicaid Other | Attending: Emergency Medicine | Admitting: Emergency Medicine

## 2023-08-14 DIAGNOSIS — R1033 Periumbilical pain: Secondary | ICD-10-CM | POA: Diagnosis present

## 2023-08-14 DIAGNOSIS — E860 Dehydration: Secondary | ICD-10-CM | POA: Insufficient documentation

## 2023-08-14 DIAGNOSIS — K529 Noninfective gastroenteritis and colitis, unspecified: Secondary | ICD-10-CM | POA: Diagnosis not present

## 2023-08-14 LAB — COMPREHENSIVE METABOLIC PANEL
ALT: 28 U/L (ref 0–44)
AST: 21 U/L (ref 15–41)
Albumin: 4.4 g/dL (ref 3.5–5.0)
Alkaline Phosphatase: 209 U/L — ABNORMAL HIGH (ref 38–126)
Anion gap: 12 (ref 5–15)
BUN: 13 mg/dL (ref 6–20)
CO2: 24 mmol/L (ref 22–32)
Calcium: 8.7 mg/dL — ABNORMAL LOW (ref 8.9–10.3)
Chloride: 96 mmol/L — ABNORMAL LOW (ref 98–111)
Creatinine, Ser: 1.13 mg/dL (ref 0.61–1.24)
GFR, Estimated: >60 mL/min (ref >60)
Glucose, Bld: 85 mg/dL (ref 70–99)
Potassium: 3 mmol/L — ABNORMAL LOW (ref 3.5–5.1)
Sodium: 132 mmol/L — ABNORMAL LOW (ref 135–145)
Total Bilirubin: 0.6 mg/dL (ref <1.2)
Total Protein: 7.4 g/dL (ref 6.5–8.1)

## 2023-08-14 LAB — CBC
HCT: 49.6 % (ref 39.0–52.0)
Hemoglobin: 16.6 g/dL (ref 13.0–17.0)
MCH: 30.9 pg (ref 26.0–34.0)
MCHC: 33.5 g/dL (ref 30.0–36.0)
MCV: 92.2 fL (ref 80.0–100.0)
Platelets: 479 10*3/uL — ABNORMAL HIGH (ref 150–400)
RBC: 5.38 MIL/uL (ref 4.22–5.81)
RDW: 13.3 % (ref 11.5–15.5)
WBC: 9.1 10*3/uL (ref 4.0–10.5)
nRBC: 0 % (ref 0.0–0.2)

## 2023-08-14 LAB — URINALYSIS, ROUTINE W REFLEX MICROSCOPIC
Bacteria, UA: NONE SEEN
Glucose, UA: NEGATIVE mg/dL
Hgb urine dipstick: NEGATIVE
Ketones, ur: 5 mg/dL — AB
Leukocytes,Ua: NEGATIVE
Nitrite: NEGATIVE
Protein, ur: 100 mg/dL — AB
Specific Gravity, Urine: 1.029 (ref 1.005–1.030)
pH: 5 (ref 5.0–8.0)

## 2023-08-14 LAB — LIPASE, BLOOD: Lipase: 24 U/L (ref 11–51)

## 2023-08-14 MED ORDER — ONDANSETRON 4 MG PO TBDP: 4.0000 mg | ORAL_TABLET | Freq: Three times a day (TID) | ORAL | 0 refills | Status: DC | PRN

## 2023-08-14 MED ORDER — DICYCLOMINE HCL 10 MG PO CAPS
10.0000 mg | ORAL_CAPSULE | Freq: Once | ORAL | Status: AC
  Administered 2023-08-14: 10 mg via ORAL
  Filled 2023-08-14: qty 1

## 2023-08-14 MED ORDER — ONDANSETRON HCL 4 MG/2ML IJ SOLN
4.0000 mg | Freq: Once | INTRAMUSCULAR | Status: AC
  Administered 2023-08-14: 4 mg via INTRAVENOUS
  Filled 2023-08-14: qty 2

## 2023-08-14 MED ORDER — SODIUM CHLORIDE 0.9 % IV BOLUS
1000.0000 mL | Freq: Once | INTRAVENOUS | Status: AC
  Administered 2023-08-14: 1000 mL via INTRAVENOUS

## 2023-08-14 MED ORDER — DICYCLOMINE HCL 10 MG PO CAPS: 10.0000 mg | ORAL_CAPSULE | Freq: Three times a day (TID) | ORAL | 0 refills | Status: DC | PRN

## 2023-08-14 NOTE — ED Provider Notes (Signed)
[facility] Provider Note    Event Date/Time   First MD Initiated Contact with Patient 08/14/23 1804     (approximate)   History   Nausea and Dizziness   HPI  Franklin [name] is a 27 y.o. male with a history of seizure disorder and anxiety who presents with nausea, dry heaving, decreased appetite, and abdominal discomfort for the last 4 to 5 days.  The patient has also had some loose stools.  He reports periumbilical abdominal discomfort which is constant.  He states he has not been able to eat much over the last few days.  He denies any fever or chills.  He has no urinary symptoms.  He has not actually vomited up any food or stomach contents.  I reviewed the past medical records.  The patient's most recent outpatient encounter was on 9/11 with pediatric neurology for follow-up of his epilepsy.   Physical Exam   Triage Vital Signs: ED Triage Vitals  Encounter Vitals Group     BP 08/14/23 1713 111/73     Systolic BP Percentile --      Diastolic BP Percentile --      Pulse Rate 08/14/23 1713 (!) 105     Resp 08/14/23 1713 18     Temp 08/14/23 1713 97.7 F (36.5 C)     Temp src --      SpO2 08/14/23 1713 99 %     Weight --      Height --      Head Circumference --      Peak Flow --      Pain Score 08/14/23 1724 2     Pain Loc --      Pain Education --      Exclude from Growth Chart --     Most recent vital signs: Vitals:   08/14/23 1713  BP: 111/73  Pulse: (!) 105  Resp: 18  Temp: 97.7 F (36.5 C)  SpO2: 99%     General: Awake, no distress.  CV:  Good peripheral perfusion.  Resp:  Normal effort.  Abd:  Soft with no focal tenderness.  No distention.  Other:  Dry mucous membranes.  No jaundice or scleral icterus.   ED Results / Procedures / Treatments   Labs (all labs ordered are listed, but only abnormal results are displayed) Labs Reviewed  COMPREHENSIVE METABOLIC PANEL - Abnormal; Notable for the following components:       Result Value   Sodium 132 (*)    Potassium 3.0 (*)    Chloride 96 (*)    Calcium 8.7 (*)    Alkaline Phosphatase 209 (*)    All other components within normal limits  CBC - Abnormal; Notable for the following components:   Platelets 479 (*)    All other components within normal limits  URINALYSIS, ROUTINE W REFLEX MICROSCOPIC - Abnormal; Notable for the following components:   Color, Urine AMBER (*)    APPearance HAZY (*)    Bilirubin Urine SMALL (*)    Ketones, ur 5 (*)    Protein, ur 100 (*)    All other components within normal limits  LIPASE, BLOOD     EKG  ED ECG REPORT I, Dionne Bucy, the attending physician, personally viewed and interpreted this ECG.  Date: 08/14/2023 EKG Time: 1736 Rate: 99 Rhythm: normal sinus rhythm QRS Axis: normal Intervals: normal ST/T Wave abnormalities: Nonspecific T wave abnormality Narrative Interpretation: no evidence of acute ischemia; no significant  change when compared to EKG of 01/18/2022    RADIOLOGY     PROCEDURES:  Critical Care performed: No  Procedures   MEDICATIONS ORDERED IN ED: Medications  sodium chloride 0.9 % bolus 1,000 mL (1,000 mLs Intravenous New Bag/Given 08/14/23 1843)  ondansetron (ZOFRAN) injection 4 mg (4 mg Intravenous Given 08/14/23 1844)  dicyclomine (BENTYL) capsule 10 mg (10 mg Oral Given 08/14/23 1851)     IMPRESSION / MDM / ASSESSMENT AND PLAN / ED COURSE  I reviewed the triage vital signs and the nursing notes.  27 year old male with PMH as noted above presents with nausea, abdominal discomfort, and some loose stools over the last several days.  On exam he is relatively well-appearing.  He was borderline tachycardic at triage.  Other vital signs are normal.  Abdomen is soft with no focal tenderness.  Lab workup reveals borderline low sodium and potassium.  Alkaline phosphatase is elevated but this appears chronic for the patient.  There is no leukocytosis.  LFTs and lipase are normal.   Urinalysis shows a few WBCs but no other findings to suggest infection.  Differential diagnosis includes, but is not limited to, viral gastroenteritis, other viral syndrome, gastritis, dehydration.  We will give fluids, Zofran, Bentyl, and reassess.  There is no indication for abdominal imaging at this time.  Patient's presentation is most consistent with acute complicated illness / injury requiring diagnostic workup.  ----------------------------------------- 7:52 PM on 08/14/2023 -----------------------------------------  The patient is feeling significantly better and is eagerly drinking water and eating crackers.  He feels comfortable and would like to go home.  He is stable for discharge at this time.  I counseled the patient and his father on the results of the workup.  I gave strict return precautions and they expressed understanding.   FINAL CLINICAL IMPRESSION(S) / ED DIAGNOSES   Final diagnoses:  Dehydration  Gastroenteritis     Rx / DC Orders   ED Discharge Orders          Ordered    ondansetron (ZOFRAN-ODT) 4 MG disintegrating tablet  Every 8 hours PRN        08/14/23 1946    dicyclomine (BENTYL) 10 MG capsule  Every 8 hours PRN        08/14/23 1946             Note:  This document was prepared using Dragon voice recognition software and may include unintentional dictation errors.    Dionne Bucy, MD 08/14/23 [phone]

## 2023-08-14 NOTE — ED Triage Notes (Signed)
Pt here with reports of nausea and some diarrhea for the last week. Endorses dizziness today.

## 2023-08-14 NOTE — Discharge Instructions (Addendum)
You may take the Zofran as needed for nausea and the Bentyl as needed for abdominal pain or cramping.  You should gradually advance your diet.  Return to the ER for new, worsening, or persistent severe abdominal pain, nausea or vomiting, diarrhea or blood in the stool, fever, weakness, or any other new or worsening symptoms that concern you.

## 2023-08-18 ENCOUNTER — Encounter (INDEPENDENT_AMBULATORY_CARE_PROVIDER_SITE_OTHER): Payer: Self-pay | Admitting: Family

## 2023-08-18 ENCOUNTER — Ambulatory Visit (INDEPENDENT_AMBULATORY_CARE_PROVIDER_SITE_OTHER): Payer: Medicaid Other | Admitting: Family

## 2023-08-18 VITALS — BP 110/70 | HR 84 | Ht 68.9 in | Wt 128.0 lb

## 2023-08-18 DIAGNOSIS — Z5941 Food insecurity: Secondary | ICD-10-CM

## 2023-08-18 DIAGNOSIS — G40309 Generalized idiopathic epilepsy and epileptic syndromes, not intractable, without status epilepticus: Secondary | ICD-10-CM

## 2023-08-18 DIAGNOSIS — F819 Developmental disorder of scholastic skills, unspecified: Secondary | ICD-10-CM

## 2023-08-18 DIAGNOSIS — R634 Abnormal weight loss: Secondary | ICD-10-CM

## 2023-08-18 DIAGNOSIS — F411 Generalized anxiety disorder: Secondary | ICD-10-CM

## 2023-08-18 DIAGNOSIS — G40109 Localization-related (focal) (partial) symptomatic epilepsy and epileptic syndromes with simple partial seizures, not intractable, without status epilepticus: Secondary | ICD-10-CM

## 2023-08-18 MED ORDER — LAMOTRIGINE 200 MG PO TABS: ORAL_TABLET | ORAL | 5 refills | Status: DC

## 2023-08-18 MED ORDER — ZONISAMIDE 100 MG PO CAPS: ORAL_CAPSULE | ORAL | 5 refills | Status: DC

## 2023-08-18 NOTE — Patient Instructions (Signed)
It was a pleasure to see you today!  Instructions for you until your next appointment are as follows: Continue taking your medications as prescribed. Remember to use a pill box or planner to help you to remember all doses Let me know if seizures occur Remember to work on getting established with a PCP Please sign up for MyChart if you have not done so. Please plan to return for follow up in 3 months or sooner if needed.  Feel free to contact our office during normal business hours at [phone] with questions or concerns. If there is no answer or the call is outside business hours, please leave a message and our clinic staff will call you back within the next business day.  If you have an urgent concern, please stay on the line for our after-hours answering service and ask for the on-call neurologist.     I also encourage you to use MyChart to communicate with me more directly. If you have not yet signed up for MyChart within [facility], the front desk staff can help you. However, please note that this inbox is NOT monitored on nights or weekends, and response can take up to 2 business days.  Urgent matters should be discussed with the on-call pediatric neurologist.   At [facility], we are committed to providing exceptional care. You will receive a patient satisfaction survey through text or email regarding your visit today. Your opinion is important to me. Comments are appreciated.

## 2023-08-18 NOTE — Progress Notes (Signed)
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 He is taking and tolerating Lamictal and Zonisamide for his seizure disorder. There have been problems in the past with lack of insurance affecting medication options as well as problems with his memory and intellect, despite use of written instructions. He has been treated with Vitamin D3 for Vitamin D deficiency.   Today's concerns: He and his father report today that his last seizure occurred in October, in the setting of missed doses. He has ongoing problems with organization and memory, and his father works to help remind him about his medications Franklin [name] was seen in the ED last week for nausea and abdominal pain. Examination was normal and his symptoms were thought to be related to malnutrition and dehydration. He and his father report today that they continue to have difficulty with food insecurity. They receive food stamps and Dad has information on local food banks.  He has not gotten established with a PCP nor a therapist as we discussed at his last visit. Franklin [name] has been otherwise generally healthy since he was last seen. No health concerns today other than previously mentioned.  Review of systems: Please see HPI for neurologic and other pertinent review of systems. Otherwise all other systems were reviewed and were negative.  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   Body mass index (BMI) of  20.0-20.9 in adult 08/17/2022   Does not have health insurance 10/24/2021   Adjustment disorder with mixed anxiety and depressed mood 09/10/2021   Poor sleep 03/01/2021   Generalized anxiety disorder 04/10/2020   Loss of weight 04/10/2020   Seizure (HCC) 05/13/2019   Hypokalemia 05/12/2019   Hypomagnesemia 05/12/2019   Hypocalcemia 05/12/2019   Intellectual delay 03/10/2018   Personal history of prematurity 03/10/2018   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 He was tried on Concerta which caused significant emotional disturbance, then on Ritalin, then Adderall which worked well. He has mild hearing loss. 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   Birth History: He was born at [redacted] weeks gestation, weighing 1 lb 13 1/2 oz. He required ventilator support for sometime. He reportedly had no CNS hemorrhage. Growth and development was delayed.   Surgical history: Past Surgical History:  Procedure Laterality Date   Central line placement     As premature infant   REFRACTIVE SURGERY     For retinopathy of prematurity  Family history: family history includes Autism in his brother; Diabetes type II in his father; Hypercholesterolemia in his father; Hypertension in his father; Stroke in his paternal aunt and paternal grandmother.   Social history: Social History   Socioeconomic History   Marital status: Single     Spouse name: Not on file   Number of children: Not on file   Years of education: Not on file   Highest education level: Not on file  Occupational History   Not on file  Tobacco Use   Smoking status: Never    Passive exposure: Current (Cousin smokes in home)   Smokeless tobacco: Never  Vaping Use   Vaping status: Some Days   Substances: Flavoring  Substance and Sexual Activity   Alcohol use: Never   Drug use: Never   Sexual activity: Never  Other Topics Concern   Not on file  Social History Narrative   Parents are divorced. Franklin [name] lives with his father.   Social Determinants of Health   Financial Resource Strain: Low Risk  (05/12/2019)   Overall Financial Resource Strain (CARDIA)    Difficulty of Paying Living Expenses: Not hard at all  Food Insecurity: Food Insecurity Present (05/12/2019)   Hunger Vital Sign    Worried About Running Out of Food in the Last Year: Never true    Ran Out of Food in the Last Year: Sometimes true  Transportation Needs: No Transportation Needs (05/12/2019)   PRAPARE - Administrator, Civil Service (Medical): No    Lack of Transportation (Non-Medical): No  Physical Activity: Sufficiently Active (05/12/2019)   Exercise Vital Sign    Days of Exercise per Week: 7 days    Minutes of Exercise per Session: 30 min  Stress: Stress Concern Present (05/12/2019)   [name] of Occupational Health - Occupational Stress Questionnaire    Feeling of Stress : To some extent  Social Connections: Socially Isolated (05/12/2019)   Social Connection and Isolation Panel [NHANES]    Frequency of Communication with Friends and Family: Once a week    Frequency of Social Gatherings with Friends and Family: Once a week    Attends Religious Services: Never    Database administrator or Organizations: No    Attends Banker Meetings: Never    Marital Status: Never married  Intimate Partner Violence: Not At Risk (05/12/2019)   Humiliation, Afraid, Rape,  and Kick questionnaire    Fear of Current or Ex-Partner: No    Emotionally Abused: No    Physically Abused: No    Sexually Abused: No   Past/failed meds: Copied from previous record: Levetiracetam - did not control seizures Fycompa - did not control seizures and made him dizzy Lamotrigine IR - dizziness Oxtellar XR - cost Briviact - had difficulty getting it from the pharmacy  Allergies: Allergies  Allergen Reactions   Concerta [Methylphenidate] Other (See Comments)    Would start crying for little things like when someone coughed or said a specific word for example.     Immunizations: Immunization History  Administered Date(s) Administered   PFIZER(Purple Top)SARS-COV-2 Vaccination 12/08/2019, 01/01/2020   Tdap 06/30/2015, 09/14/2018, 02/18/2022    Diagnostics/Screenings: Copied from previous record: 09/14/2022 MRI Brain wo contrast - 1. No acute brain finding. 2. 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 3. Question mild volume loss and increased FLAIR signal within the hippocampus on the left compared  to the right. I am not sure this is definite, but could indicate subtle mesial temporal sclerosis on the left. Relative asymmetric prominence of the occipital horn of the left lateral ventricle but without evidence of definable focal abnormality of the left occipital lobe.   07/03/2022 - rEEG - This is a abnormal record with the patient in awake and drowsy states due to generalized slowing.  No evidence of decreased seizure threshold, however does not rule out epilepsy.  Clinical correlation advised. Lorenz Coaster MD MPH  Physical Exam: BP 110/70 (BP Location: Left Arm, Patient Position: Sitting, Cuff Size: Normal)   Pulse 84   Ht 5' 8.9" (1.75 m)   Wt 128 lb (58.1 kg)   BMI 18.96 kg/m   Wt Readings from Last 3 Encounters:  08/18/23 128 lb (58.1 kg)  05/19/23 131 lb 6.4 oz (59.6  kg)  02/03/23 135 lb (61.2 kg)  General: Thin but well developed, well nourished, seated, in no evident distress Head: Head normocephalic and atraumatic.  Oropharynx benign. Neck: Supple Cardiovascular: Regular rate and rhythm, no murmurs Respiratory: Breath sounds clear to auscultation Musculoskeletal: No obvious deformities or scoliosis Skin: No rashes or neurocutaneous lesions  Neurologic Exam Mental Status: Awake and fully alert.  Oriented to place and time. Attention span, concentration, and fund of knowledge subnormal for age.  Mood and affect appropriate. Cranial Nerves: Fundoscopic exam reveals sharp disc margins.  Pupils equal, briskly reactive to light.  Extraocular movements full without nystagmus. Hearing intact and symmetric to whisper.  Facial sensation intact.  Face tongue, palate move normally and symmetrically. Shoulder shrug normal Motor: Normal bulk and tone. Normal strength in all tested extremity muscles. Sensory: Intact to touch and temperature in all extremities.  Coordination: Rapid alternating movements normal in all extremities.  Finger-to-nose and heel-to shin performed accurately bilaterally.  Romberg negative. Gait and Station: Arises from chair without difficulty.  Stance is normal. Gait demonstrates normal stride length and balance.   Able to heel, toe and tandem walk without difficulty.   Impression: Generalized convulsive epilepsy (HCC) - Plan: lamoTRIgine (LAMICTAL) 200 MG tablet, zonisamide (ZONEGRAN) 100 MG capsule  Intellectual delay  Generalized anxiety disorder  Focal epilepsy (HCC)  Loss of weight  Food insecurity   Recommendations for plan of care: The patient's previous Epic records were reviewed. No recent diagnostic studies to be reviewed with the patient.  Plan until next visit: Continue medications as prescribed  Call for questions or concerns Work on getting established with a PCP and a therapist Return in about 3 months (around  11/16/2023).  The medication list was reviewed and reconciled. No changes were made in the prescribed medications today. A complete medication list was provided to the patient.  Allergies as of 08/18/2023       Reactions   Concerta [methylphenidate] Other (See Comments)   Would start crying for little things like when someone coughed or said a specific word for example.         Medication List        Accurate as of August 18, 2023  3:48 PM. If you have any questions, ask your nurse or doctor.          acetaminophen 325 MG tablet Commonly known as: TYLENOL Take 2 tablets (650 mg total) by mouth every 4 (four) hours as needed for mild pain (or temp >/= 99.5).   aspirin EC 325 MG tablet Take 325 mg by mouth daily.   dicyclomine 10 MG capsule Commonly known as:  BENTYL Take 1 capsule (10 mg total) by mouth every 8 (eight) hours as needed for spasms.   lamoTRIgine 200 MG tablet Commonly known as: LAMICTAL TAKE 1 TABLET BY MOUTH IN THE MORNING AND 2 AT NIGHT   ondansetron 4 MG disintegrating tablet Commonly known as: ZOFRAN-ODT Take 1 tablet (4 mg total) by mouth every 8 (eight) hours as needed.   zonisamide 100 MG capsule Commonly known as: ZONEGRAN Take 3 capsules at bedtime      Total time spent with the patient was 20 minutes, of which 50% or more was spent in counseling and coordination of care.  Elveria Rising NP-C [facility] [address], [address] [place], [place] [address] Ph. [phone] Fax [phone]

## 2023-08-29 ENCOUNTER — Emergency Department (HOSPITAL_COMMUNITY): Payer: Medicaid Other

## 2023-08-29 ENCOUNTER — Emergency Department (HOSPITAL_COMMUNITY)
Admission: EM | Admit: 2023-08-29 | Discharge: 2023-08-29 | Disposition: A | Discharge status: Home / Self Care | Payer: Medicaid Other | Attending: Emergency Medicine | Admitting: Emergency Medicine

## 2023-08-29 ENCOUNTER — Encounter (HOSPITAL_COMMUNITY): Payer: Self-pay

## 2023-08-29 ENCOUNTER — Other Ambulatory Visit: Payer: Self-pay

## 2023-08-29 DIAGNOSIS — D72829 Elevated white blood cell count, unspecified: Secondary | ICD-10-CM | POA: Diagnosis not present

## 2023-08-29 DIAGNOSIS — N132 Hydronephrosis with renal and ureteral calculous obstruction: Secondary | ICD-10-CM | POA: Diagnosis not present

## 2023-08-29 DIAGNOSIS — N200 Calculus of kidney: Secondary | ICD-10-CM

## 2023-08-29 DIAGNOSIS — R109 Unspecified abdominal pain: Secondary | ICD-10-CM | POA: Diagnosis present

## 2023-08-29 LAB — URINALYSIS, ROUTINE W REFLEX MICROSCOPIC
Bacteria, UA: NONE SEEN
Bilirubin Urine: NEGATIVE
Glucose, UA: NEGATIVE mg/dL
Ketones, ur: 5 mg/dL — AB
Leukocytes,Ua: NEGATIVE
Nitrite: NEGATIVE
Protein, ur: 100 mg/dL — AB
RBC / HPF: >50 RBC/hpf (ref 0–5)
Specific Gravity, Urine: 1.028 (ref 1.005–1.030)
pH: 5 (ref 5.0–8.0)

## 2023-08-29 LAB — CBC
HCT: 44.1 % (ref 39.0–52.0)
Hemoglobin: 15.5 g/dL (ref 13.0–17.0)
MCH: 32.2 pg (ref 26.0–34.0)
MCHC: 35.1 g/dL (ref 30.0–36.0)
MCV: 91.7 fL (ref 80.0–100.0)
Platelets: 565 10*3/uL — ABNORMAL HIGH (ref 150–400)
RBC: 4.81 MIL/uL (ref 4.22–5.81)
RDW: 14.1 % (ref 11.5–15.5)
WBC: 14.3 10*3/uL — ABNORMAL HIGH (ref 4.0–10.5)
nRBC: 0 % (ref 0.0–0.2)

## 2023-08-29 LAB — BASIC METABOLIC PANEL
Anion gap: 11 (ref 5–15)
BUN: 11 mg/dL (ref 6–20)
CO2: 23 mmol/L (ref 22–32)
Calcium: 9.4 mg/dL (ref 8.9–10.3)
Chloride: 104 mmol/L (ref 98–111)
Creatinine, Ser: 0.85 mg/dL (ref 0.61–1.24)
GFR, Estimated: >60 mL/min (ref >60)
Glucose, Bld: 121 mg/dL — ABNORMAL HIGH (ref 70–99)
Potassium: 3.1 mmol/L — ABNORMAL LOW (ref 3.5–5.1)
Sodium: 138 mmol/L (ref 135–145)

## 2023-08-29 MED ORDER — ONDANSETRON HCL 4 MG/2ML IJ SOLN
4.0000 mg | Freq: Once | INTRAMUSCULAR | Status: AC
  Administered 2023-08-29: 4 mg via INTRAVENOUS
  Filled 2023-08-29: qty 2

## 2023-08-29 MED ORDER — MORPHINE SULFATE (PF) 4 MG/ML IV SOLN
4.0000 mg | Freq: Once | INTRAVENOUS | Status: AC
  Administered 2023-08-29: 4 mg via INTRAVENOUS
  Filled 2023-08-29: qty 1

## 2023-08-29 MED ORDER — TAMSULOSIN HCL 0.4 MG PO CAPS: 0.4000 mg | ORAL_CAPSULE | Freq: Every day | ORAL | 0 refills | Status: DC

## 2023-08-29 MED ORDER — KETOROLAC TROMETHAMINE 15 MG/ML IJ SOLN
15.0000 mg | Freq: Once | INTRAMUSCULAR | Status: AC
  Administered 2023-08-29: 15 mg via INTRAVENOUS
  Filled 2023-08-29: qty 1

## 2023-08-29 MED ORDER — ONDANSETRON 4 MG PO TBDP: 4.0000 mg | ORAL_TABLET | Freq: Three times a day (TID) | ORAL | 0 refills | Status: DC | PRN

## 2023-08-29 MED ORDER — OXYCODONE-ACETAMINOPHEN 5-325 MG PO TABS: 1.0000 | ORAL_TABLET | Freq: Four times a day (QID) | ORAL | 0 refills | Status: DC | PRN

## 2023-08-29 MED ORDER — POTASSIUM CHLORIDE CRYS ER 20 MEQ PO TBCR: 40.0000 meq | EXTENDED_RELEASE_TABLET | Freq: Once | ORAL | Status: DC

## 2023-08-29 MED ORDER — SODIUM CHLORIDE 0.9 % IV BOLUS
1000.0000 mL | Freq: Once | INTRAVENOUS | Status: AC
  Administered 2023-08-29: 1000 mL via INTRAVENOUS

## 2023-08-29 NOTE — ED Triage Notes (Signed)
Pt arrives POV with his father. PT reports right flank pain, nausea, and vomiting since yesterday. Reports concern for kidney stone. Denies urinary symptoms.

## 2023-08-29 NOTE — ED Provider Notes (Signed)
[place] EMERGENCY DEPARTMENT AT [facility] Provider Note   CSN: [id number] Arrival date & time: 08/29/23  1050     History  Chief Complaint  Patient presents with   Flank Pain   Emesis    Franklin [name] is a 27 y.o. male.  Patient with history of epilepsy brought in by dad presents today with complaints of right flank pain, nausea, and vomiting. States that same began yesterday and has been persistent since. Denies fevers or chills. No dysuria. No history of similar symptoms previously, however does have significant family history of kidney stones and thought that could be what is going on. Denies abdominal pain, is having regular bowel movements.   The history is provided by the patient. No language interpreter was used.  Flank Pain  Emesis      Home Medications Prior to Admission medications   Medication Sig Start Date End Date Taking? Authorizing Provider  acetaminophen (TYLENOL) 325 MG tablet Take 2 tablets (650 mg total) by mouth every 4 (four) hours as needed for mild pain (or temp >/= 99.5). Patient not taking: Reported on 05/19/2023 05/14/19   Albertine Grates, MD  aspirin EC 325 MG tablet Take 325 mg by mouth daily. Patient not taking: Reported on 05/19/2023    [provider]  dicyclomine (BENTYL) 10 MG capsule Take 1 capsule (10 mg total) by mouth every 8 (eight) hours as needed for spasms. 08/14/23   Dionne Bucy, MD  lamoTRIgine (LAMICTAL) 200 MG tablet TAKE 1 TABLET BY MOUTH IN THE MORNING AND 2 AT NIGHT 08/18/23   Elveria Rising, NP  ondansetron (ZOFRAN-ODT) 4 MG disintegrating tablet Take 1 tablet (4 mg total) by mouth every 8 (eight) hours as needed. 08/14/23   Dionne Bucy, MD  zonisamide (ZONEGRAN) 100 MG capsule Take 3 capsules at bedtime 08/18/23   Elveria Rising, NP      Allergies    Concerta [methylphenidate]    Review of Systems   Review of Systems  Gastrointestinal:  Positive for vomiting.  Genitourinary:  Positive  for flank pain.  All other systems reviewed and are negative.   Physical Exam Updated Vital Signs BP 125/75   Pulse 76   Temp 98 F (36.7 C)   Resp 17   Ht 5\' 9"  (1.753 m)   Wt 58.1 kg   SpO2 99%   BMI 18.90 kg/m  Physical Exam Vitals and nursing note reviewed.  Constitutional:      General: He is not in acute distress.    Appearance: Normal appearance. He is normal weight. He is not ill-appearing, toxic-appearing or diaphoretic.  HENT:     Head: Normocephalic and atraumatic.  Cardiovascular:     Rate and Rhythm: Normal rate.  Pulmonary:     Effort: Pulmonary effort is normal. No respiratory distress.  Abdominal:     General: Abdomen is flat.     Palpations: Abdomen is soft.     Tenderness: There is no abdominal tenderness. There is no right CVA tenderness or left CVA tenderness.  Musculoskeletal:        General: Normal range of motion.     Cervical back: Normal range of motion.  Skin:    General: Skin is warm and dry.  Neurological:     General: No focal deficit present.     Mental Status: He is alert.  Psychiatric:        Mood and Affect: Mood normal.        Behavior: Behavior normal.  ED Results / Procedures / Treatments   Labs (all labs ordered are listed, but only abnormal results are displayed) Labs Reviewed  URINALYSIS, ROUTINE W REFLEX MICROSCOPIC - Abnormal; Notable for the following components:      Result Value   APPearance HAZY (*)    Hgb urine dipstick LARGE (*)    Ketones, ur 5 (*)    Protein, ur 100 (*)    All other components within normal limits  BASIC METABOLIC PANEL - Abnormal; Notable for the following components:   Potassium 3.1 (*)    Glucose, Bld 121 (*)    All other components within normal limits  CBC - Abnormal; Notable for the following components:   WBC 14.3 (*)    Platelets 565 (*)    All other components within normal limits    EKG None  Radiology CT Renal Stone Study Result Date: 08/29/2023 CLINICAL DATA:  Right  flank pain, nausea, vomiting. EXAM: CT ABDOMEN AND PELVIS WITHOUT CONTRAST TECHNIQUE: Multidetector CT imaging of the abdomen and pelvis was performed following the standard protocol without IV contrast. RADIATION DOSE REDUCTION: This exam was performed according to the departmental dose-optimization program which includes automated exposure control, adjustment of the mA and/or kV according to patient size and/or use of iterative reconstruction technique. COMPARISON:  CT abdomen and pelvis dated 12/15/2019. FINDINGS: Lower chest: No acute abnormality. Hepatobiliary: No focal liver abnormality is seen. No gallstones, gallbladder wall thickening, or biliary dilatation. Pancreas: Unremarkable. No pancreatic ductal dilatation or surrounding inflammatory changes. Spleen: Normal in size without focal abnormality. Adrenals/Urinary Tract: Adrenal glands are unremarkable. A 6 mm calculus is seen in the midportion of the right ureter and there is mild right hydronephrosis. There is a 2 mm nonobstructing left renal calculus. No left hydronephrosis. A 2 mm calculus appears to be located within the urinary bladder lumen. The urinary bladder is incompletely distended. Stomach/Bowel: Stomach is within normal limits. Appendix appears normal. No evidence of bowel wall thickening, distention, or inflammatory changes. Vascular/Lymphatic: No significant vascular findings are present. Multiple mildly enlarged mesenteric lymph nodes are seen in the right hemiabdomen. Reproductive: Prostate is unremarkable. Other: No abdominal wall hernia or abnormality. No abdominopelvic ascites. Musculoskeletal: No acute or significant osseous findings. IMPRESSION: 1. Obstructing 6 mm calculus in the midportion of the right ureter with mild right hydronephrosis. 2. A 2 mm calculus appears to be located within the urinary bladder lumen and may reflect a recently passed stone. 3. Multiple mildly enlarged mesenteric lymph nodes in the right hemiabdomen are  nonspecific but can be seen in the setting of mesenteric adenitis. Electronically Signed   By: Romona Curls M.D.   On: 08/29/2023 12:10    Procedures Procedures    Medications Ordered in ED Medications  ondansetron (ZOFRAN) injection 4 mg (4 mg Intravenous Given 08/29/23 1252)  ketorolac (TORADOL) 15 MG/ML injection 15 mg (15 mg Intravenous Given 08/29/23 1252)  sodium chloride 0.9 % bolus 1,000 mL (0 mLs Intravenous Stopped 08/29/23 1435)  morphine (PF) 4 MG/ML injection 4 mg (4 mg Intravenous Given 08/29/23 1348)    ED Course/ Medical Decision Making/ A&P                                 Medical Decision Making Amount and/or Complexity of Data Reviewed Labs: ordered. Radiology: ordered.  Risk Prescription drug management.   This patient is a 27 y.o. male who presents to the ED for concern  of flank pain, this involves an extensive number of treatment options, and is a complaint that carries with it a high risk of complications and morbidity. The emergent differential diagnosis prior to evaluation includes, but is not limited to,  AAA, renal artery/vein embolism/thrombosis, mesenteric ischemia, pyelonephritis, nephrolithiasis, cystitis, biliary colic, pancreatitis, perforated peptic ulcer, appendicitis, diverticulitis, bowel obstruction, Testicular torsion, Epididymitis   This is not an exhaustive differential.   Past Medical History / Co-morbidities / Social History:  has a past medical history of Prematurity, Retinopathy of prematurity, and Seizures (HCC).  Patient's father present at bedside is listed as patient's legal guardian  Additional history: Chart reviewed.   Physical Exam: Physical exam performed. The pertinent findings include: No abdominal tenderness or CVA tenderness.  Lab Tests: I ordered, and personally interpreted labs.  The pertinent results include:  WBC 14.3, K 3.1, UA noninfectious   Imaging Studies: I ordered imaging studies including CT renal. I  independently visualized and interpreted imaging which showed   1. Obstructing 6 mm calculus in the midportion of the right ureter with mild right hydronephrosis. 2. A 2 mm calculus appears to be located within the urinary bladder lumen and may reflect a recently passed stone. 3. Multiple mildly enlarged mesenteric lymph nodes in the right hemiabdomen are nonspecific but can be seen in the setting of mesenteric adenitis.  I agree with the radiologist interpretation.   Medications: I ordered medication including zofran, toradol, fluids, morphine  for pain, nausea, oral potassium for hypokalemia. Reevaluation of the patient after these medicines showed that the patient improved. I have reviewed the patients home medicines and have made adjustments as needed.   Disposition: After consideration of the diagnostic results and the patients response to treatment, I feel that emergency department workup does not suggest an emergent condition requiring admission or immediate intervention beyond what has been performed at this time. The plan is: discharge with close outpatient follow-up and return precautions. Patient with right sided kidney stone, likely etiology of symptoms. Also with enlarged lymph nodes, likely due to inflammatory changes from kidney stone. Will need close outpatient follow-up after resolution of symptoms to ensure that these changes have resolved. Patient has no signs of UTI, his pain is controlled and he is able to eat and drink without residual nausea or vomiting and he feels ready to go home. Will give referral to urology for follow-up and prescription for flomax, percocet and zofran. PDMP reviewed. Patient advised not to drive or operate heavy machinery while taking this medication. Evaluation and diagnostic testing in the emergency department does not suggest an emergent condition requiring admission or immediate intervention beyond what has been performed at this time.  Plan for  discharge with close PCP follow-up.  Patient is understanding and amenable with plan, educated on red flag symptoms that would prompt immediate return.  Patient discharged in stable condition.  Final Clinical Impression(s) / ED Diagnoses Final diagnoses:  Kidney stone    Rx / DC Orders ED Discharge Orders          Ordered    oxyCODONE-acetaminophen (PERCOCET/ROXICET) 5-325 MG tablet  Every 6 hours PRN        08/29/23 1517    tamsulosin (FLOMAX) 0.4 MG CAPS capsule  Daily        08/29/23 1517    ondansetron (ZOFRAN-ODT) 4 MG disintegrating tablet  Every 8 hours PRN        08/29/23 1517          An After Visit  Summary was printed and given to the patient.     Vear Clock 08/29/23 1521    Pricilla Loveless, MD 08/30/23 [id number]

## 2023-08-29 NOTE — Discharge Instructions (Addendum)
As we discussed, your workup in the ER today revealed that you have a 6 mm kidney stone on the right side that is likely the cause of your pain.  This should pass on its own without intervention.  To help with this, I have given you a prescription for Flomax which should dilate your urinary tract and help the stone pass.  Please take this as prescribed every day until you are no longer having pain.  Additionally, have given you a prescription for Percocet which is a pain medication you to take as prescribed as needed for severe pain only.  Do not drive or operate heavy machinery while taking this medication as it can be sedating.  I have also given you a prescription for Zofran for you to take as prescribed as needed for nausea and vomiting.  You will need to call urology to schedule a follow-up appointment as well.  I have given you a referral with a number to call.  Please do so at your earliest convenience.  Return if development of any new or worsening symptoms.

## 2023-09-03 ENCOUNTER — Inpatient Hospital Stay (HOSPITAL_COMMUNITY)
Admission: EM | Admit: 2023-09-03 | Discharge: 2023-09-06 | DRG: 694 | Disposition: A | Discharge status: Home / Self Care | Payer: Medicaid Other | Attending: Student | Admitting: Student

## 2023-09-03 ENCOUNTER — Other Ambulatory Visit: Payer: Self-pay

## 2023-09-03 ENCOUNTER — Encounter (HOSPITAL_COMMUNITY): Payer: Self-pay

## 2023-09-03 DIAGNOSIS — N210 Calculus in bladder: Secondary | ICD-10-CM | POA: Diagnosis present

## 2023-09-03 DIAGNOSIS — F819 Developmental disorder of scholastic skills, unspecified: Secondary | ICD-10-CM | POA: Diagnosis present

## 2023-09-03 DIAGNOSIS — E876 Hypokalemia: Secondary | ICD-10-CM | POA: Diagnosis not present

## 2023-09-03 DIAGNOSIS — Z79899 Other long term (current) drug therapy: Secondary | ICD-10-CM

## 2023-09-03 DIAGNOSIS — D72829 Elevated white blood cell count, unspecified: Secondary | ICD-10-CM | POA: Diagnosis present

## 2023-09-03 DIAGNOSIS — N200 Calculus of kidney: Secondary | ICD-10-CM

## 2023-09-03 DIAGNOSIS — F17290 Nicotine dependence, other tobacco product, uncomplicated: Secondary | ICD-10-CM | POA: Diagnosis present

## 2023-09-03 DIAGNOSIS — D75839 Thrombocytosis, unspecified: Secondary | ICD-10-CM | POA: Diagnosis present

## 2023-09-03 DIAGNOSIS — N201 Calculus of ureter: Principal | ICD-10-CM | POA: Diagnosis present

## 2023-09-03 DIAGNOSIS — N1330 Unspecified hydronephrosis: Secondary | ICD-10-CM | POA: Diagnosis not present

## 2023-09-03 DIAGNOSIS — G40909 Epilepsy, unspecified, not intractable, without status epilepticus: Secondary | ICD-10-CM | POA: Diagnosis present

## 2023-09-03 DIAGNOSIS — Z888 Allergy status to other drugs, medicaments and biological substances status: Secondary | ICD-10-CM

## 2023-09-03 DIAGNOSIS — E861 Hypovolemia: Secondary | ICD-10-CM | POA: Diagnosis present

## 2023-09-03 DIAGNOSIS — Z818 Family history of other mental and behavioral disorders: Secondary | ICD-10-CM

## 2023-09-03 DIAGNOSIS — N132 Hydronephrosis with renal and ureteral calculous obstruction: Principal | ICD-10-CM | POA: Diagnosis present

## 2023-09-03 DIAGNOSIS — F79 Unspecified intellectual disabilities: Secondary | ICD-10-CM | POA: Diagnosis present

## 2023-09-03 DIAGNOSIS — Z833 Family history of diabetes mellitus: Secondary | ICD-10-CM

## 2023-09-03 DIAGNOSIS — E871 Hypo-osmolality and hyponatremia: Secondary | ICD-10-CM | POA: Diagnosis not present

## 2023-09-03 DIAGNOSIS — R569 Unspecified convulsions: Secondary | ICD-10-CM

## 2023-09-03 DIAGNOSIS — Z83438 Family history of other disorder of lipoprotein metabolism and other lipidemia: Secondary | ICD-10-CM

## 2023-09-03 DIAGNOSIS — Z8249 Family history of ischemic heart disease and other diseases of the circulatory system: Secondary | ICD-10-CM

## 2023-09-03 DIAGNOSIS — Z823 Family history of stroke: Secondary | ICD-10-CM

## 2023-09-03 LAB — URINALYSIS, ROUTINE W REFLEX MICROSCOPIC
Bacteria, UA: NONE SEEN
Bilirubin Urine: NEGATIVE
Glucose, UA: NEGATIVE mg/dL
Ketones, ur: 5 mg/dL — AB
Leukocytes,Ua: NEGATIVE
Nitrite: NEGATIVE
Protein, ur: NEGATIVE mg/dL
Specific Gravity, Urine: 1.003 — ABNORMAL LOW (ref 1.005–1.030)
pH: 7 (ref 5.0–8.0)

## 2023-09-03 LAB — CBC
HCT: 42.2 % (ref 39.0–52.0)
Hemoglobin: 14.7 g/dL (ref 13.0–17.0)
MCH: 31.1 pg (ref 26.0–34.0)
MCHC: 34.8 g/dL (ref 30.0–36.0)
MCV: 89.4 fL (ref 80.0–100.0)
Platelets: 728 10*3/uL — ABNORMAL HIGH (ref 150–400)
RBC: 4.72 MIL/uL (ref 4.22–5.81)
RDW: 13.5 % (ref 11.5–15.5)
WBC: 13.1 10*3/uL — ABNORMAL HIGH (ref 4.0–10.5)
nRBC: 0 % (ref 0.0–0.2)

## 2023-09-03 LAB — BASIC METABOLIC PANEL
Anion gap: 11 (ref 5–15)
BUN: 12 mg/dL (ref 6–20)
CO2: 26 mmol/L (ref 22–32)
Calcium: 8.8 mg/dL — ABNORMAL LOW (ref 8.9–10.3)
Chloride: 92 mmol/L — ABNORMAL LOW (ref 98–111)
Creatinine, Ser: 0.84 mg/dL (ref 0.61–1.24)
GFR, Estimated: >60 mL/min (ref >60)
Glucose, Bld: 108 mg/dL — ABNORMAL HIGH (ref 70–99)
Potassium: 2.6 mmol/L — CL (ref 3.5–5.1)
Sodium: 129 mmol/L — ABNORMAL LOW (ref 135–145)

## 2023-09-03 LAB — MAGNESIUM: Magnesium: 2.2 mg/dL (ref 1.7–2.4)

## 2023-09-03 MED ORDER — HYDROMORPHONE HCL 1 MG/ML IJ SOLN
1.0000 mg | Freq: Once | INTRAMUSCULAR | Status: AC
  Administered 2023-09-03: 1 mg via INTRAVENOUS
  Filled 2023-09-03: qty 1

## 2023-09-03 MED ORDER — POTASSIUM CHLORIDE 10 MEQ/100ML IV SOLN
10.0000 meq | INTRAVENOUS | Status: AC
  Administered 2023-09-03 (×3): 10 meq via INTRAVENOUS
  Filled 2023-09-03 (×3): qty 100

## 2023-09-03 MED ORDER — SODIUM CHLORIDE 0.9 % IV BOLUS
1000.0000 mL | Freq: Once | INTRAVENOUS | Status: AC
  Administered 2023-09-03: 1000 mL via INTRAVENOUS

## 2023-09-03 MED ORDER — POTASSIUM CHLORIDE CRYS ER 20 MEQ PO TBCR
40.0000 meq | EXTENDED_RELEASE_TABLET | Freq: Once | ORAL | Status: AC
  Administered 2023-09-03: 40 meq via ORAL
  Filled 2023-09-03: qty 2

## 2023-09-03 MED ORDER — KETOROLAC TROMETHAMINE 30 MG/ML IJ SOLN
30.0000 mg | Freq: Once | INTRAMUSCULAR | Status: AC
  Administered 2023-09-03: 30 mg via INTRAVENOUS
  Filled 2023-09-03: qty 1

## 2023-09-03 MED ORDER — ONDANSETRON HCL 4 MG/2ML IJ SOLN
4.0000 mg | Freq: Once | INTRAMUSCULAR | Status: AC
  Administered 2023-09-03: 4 mg via INTRAVENOUS
  Filled 2023-09-03: qty 2

## 2023-09-03 NOTE — ED Triage Notes (Signed)
Pt BIBA from home, c/o right flank pain for 5 days, says he passed a kidney stone today. Pt took prescribed percocet 1 hour before calling ambulance.

## 2023-09-03 NOTE — ED Provider Notes (Signed)
[place] EMERGENCY DEPARTMENT AT [facility] Provider Note   CSN: [id number] Arrival date & time: 09/03/23  2024     History  Chief Complaint  Patient presents with   Nephrolithiasis    Franklin [name] is a 27 y.o. male.  HPI   Patient has his to be of seizure disorder who presents to the ED for evaluation of flank pain.  Patient states he started having pain in his flank about 5 days ago.  He was seen at this emergency room on the 22nd.  At that time he had a CT scan.  It showed a 6 mm stone in the midportion of the right ureter with mild hydronephrosis.  Patient also had what appeared to be a recently passed 2 mm stone within the urinary bladder.  Patient states he has continued to have pain since that time.  He has been taking his prescribed oxycodone but the pain persists.  Patient denies any fevers.  No recent nausea or vomiting.  Patient was given a referral to urology but has not set up an appointment yet  Home Medications Prior to Admission medications   Medication Sig Start Date End Date Taking? Authorizing Provider  acetaminophen (TYLENOL) 325 MG tablet Take 2 tablets (650 mg total) by mouth every 4 (four) hours as needed for mild pain (or temp >/= 99.5). Patient not taking: Reported on 05/19/2023 05/14/19   Albertine Grates, MD  aspirin EC 325 MG tablet Take 325 mg by mouth daily. Patient not taking: Reported on 05/19/2023    [provider]  dicyclomine (BENTYL) 10 MG capsule Take 1 capsule (10 mg total) by mouth every 8 (eight) hours as needed for spasms. 08/14/23   Dionne Bucy, MD  lamoTRIgine (LAMICTAL) 200 MG tablet TAKE 1 TABLET BY MOUTH IN THE MORNING AND 2 AT NIGHT 08/18/23   Elveria Rising, NP  ondansetron (ZOFRAN-ODT) 4 MG disintegrating tablet Take 1 tablet (4 mg total) by mouth every 8 (eight) hours as needed for nausea or vomiting. 08/29/23   Smoot, Shawn Route, PA-C  oxyCODONE-acetaminophen (PERCOCET/ROXICET) 5-325 MG tablet Take 1 tablet by  mouth every 6 (six) hours as needed for severe pain (pain score 7-10). 08/29/23   Smoot, Shawn Route, PA-C  tamsulosin (FLOMAX) 0.4 MG CAPS capsule Take 1 capsule (0.4 mg total) by mouth daily. 08/29/23   Smoot, Shawn Route, PA-C  zonisamide (ZONEGRAN) 100 MG capsule Take 3 capsules at bedtime 08/18/23   Elveria Rising, NP      Allergies    Concerta [methylphenidate]    Review of Systems   Review of Systems  Physical Exam Updated Vital Signs BP (!) 130/95   Pulse 86   Temp 97.9 F (36.6 C) (Oral)   Resp 17   Ht 1.753 m (5\' 9" )   Wt 58.1 kg   SpO2 98%   BMI 18.90 kg/m  Physical Exam Vitals and nursing note reviewed.  Constitutional:      General: He is not in acute distress.    Appearance: He is well-developed.  HENT:     Head: Normocephalic and atraumatic.     Right Ear: External ear normal.     Left Ear: External ear normal.  Eyes:     General: No scleral icterus.       Right eye: No discharge.        Left eye: No discharge.     Conjunctiva/sclera: Conjunctivae normal.  Neck:     Trachea: No tracheal deviation.  Cardiovascular:  Rate and Rhythm: Normal rate and regular rhythm.  Pulmonary:     Effort: Pulmonary effort is normal. No respiratory distress.     Breath sounds: Normal breath sounds. No stridor. No wheezing or rales.  Abdominal:     General: Bowel sounds are normal. There is no distension.     Palpations: Abdomen is soft.     Tenderness: There is no abdominal tenderness. There is right CVA tenderness. There is no guarding or rebound.  Musculoskeletal:        General: No tenderness or deformity.     Cervical back: Neck supple.  Skin:    General: Skin is warm and dry.     Findings: No rash.  Neurological:     General: No focal deficit present.     Mental Status: He is alert.     Cranial Nerves: No cranial nerve deficit, dysarthria or facial asymmetry.     Sensory: No sensory deficit.     Motor: No abnormal muscle tone or seizure activity.      Coordination: Coordination normal.  Psychiatric:        Mood and Affect: Mood normal.     ED Results / Procedures / Treatments   Labs (all labs ordered are listed, but only abnormal results are displayed) Labs Reviewed  CBC - Abnormal; Notable for the following components:      Result Value   WBC 13.1 (*)    Platelets 728 (*)    All other components within normal limits  BASIC METABOLIC PANEL - Abnormal; Notable for the following components:   Sodium 129 (*)    Potassium 2.6 (*)    Chloride 92 (*)    Glucose, Bld 108 (*)    Calcium 8.8 (*)    All other components within normal limits  URINALYSIS, ROUTINE W REFLEX MICROSCOPIC - Abnormal; Notable for the following components:   Color, Urine STRAW (*)    Specific Gravity, Urine 1.003 (*)    Hgb urine dipstick MODERATE (*)    Ketones, ur 5 (*)    All other components within normal limits  MAGNESIUM    EKG None  Radiology No results found.  Procedures Procedures    Medications Ordered in ED Medications  potassium chloride 10 mEq in 100 mL IVPB (10 mEq Intravenous New Bag/Given 09/03/23 2251)  sodium chloride 0.9 % bolus 1,000 mL (has no administration in time range)  HYDROmorphone (DILAUDID) injection 1 mg (1 mg Intravenous Given 09/03/23 2059)  ondansetron (ZOFRAN) injection 4 mg (4 mg Intravenous Given 09/03/23 2100)  potassium chloride SA (KLOR-CON M) CR tablet 40 mEq (40 mEq Oral Given 09/03/23 2145)  ketorolac (TORADOL) 30 MG/ML injection 30 mg (30 mg Intravenous Given 09/03/23 2246)  HYDROmorphone (DILAUDID) injection 1 mg (1 mg Intravenous Given 09/03/23 2246)    ED Course/ Medical Decision Making/ A&P Clinical Course as of 09/03/23 2255  Fri Sep 03, 2023  2110 Notified that potassium is 2.6 [JK]  2124 CBC(!) CBC with slight increase in white blood cell count. [JK]  2253 Patient is having more pain again requesting additional pain medications [JK]    Clinical Course User Index [JK] Linwood Dibbles, MD                                  Medical Decision Making Amount and/or Complexity of Data Reviewed Labs: ordered. Decision-making details documented in ED Course.  Risk Prescription drug management. Decision  regarding hospitalization.   Patient presented to the ED for evaluation of recurrent renal colic pain.  Patient is afebrile here.  No signs of infection.  His laboratory test however do show significant hypokalemia with a potassium of 2.6.  I have ordered IV and oral potassium replacement.  Patient however does continue to have pain associated with his 6 mm ureteral stone.  With his persistent pain and hypokalemia I will consult with medical service for admission for pain management and IV potassium replacement Case discussed with Dr. Cyndia Bent       Final Clinical Impression(s) / ED Diagnoses Final diagnoses:  Ureterolithiasis  Hypokalemia  Hyponatremia    Rx / DC Orders ED Discharge Orders     None         Linwood Dibbles, MD 09/03/23 2303

## 2023-09-03 NOTE — ED Notes (Signed)
Pt aware of need for urine specimen. Pt has urinal at bedside.

## 2023-09-03 NOTE — H&P (Signed)
History and Physical    Patient: Franklin [name] NWG:[id number] DOB: [birth year] DOA: 09/03/2023 DOS: the patient was seen and examined on 09/04/2023 PCP: Pcp, No  Patient coming from: Home  Chief Complaint:  Chief Complaint  Patient presents with   Nephrolithiasis   HPI: Franklin [name] is a 27 y.o. male with medical history significant of epilepsy, intellectual delay, nephrolithiasis who presents with worsening right flank pain.  Patient was evaluated in the ED on 08/29/2023 for right flank pain with nausea and vomiting.  Had CT renal imaging demonstrating obstructing 6 mm calculus in the midportion of the right ureter with mild right hydronephrosis.  A 2 mm calculus in the urinary bladder. He wad discharged home with Percocet, Flomax and zofran. Has only been taking one to 2 percocet per day. Continue to have focal right flank pain. Has noticed some fragments in urine but no hematuria. Decrease oral intake for the past week. Has vomiting for a few days initially but this resolved yesterday.  No diarrhea or abdominal pain.  On arrival to ED, he was afebrile, elevated BP of 139/100.  CBC shows leukocytosis of 13.1, chronic thrombocytosis of 728.  BMP demonstrated hyponatremia of 129, hypokalemia of 2.6, stable creatinine similar to prior at 0.84.  UA with moderate hemoglobin, 5 ketones but otherwise negative leukocyte, nitrite and no bacteria.  Hospitalist consulted for admission for observation for correction of electrolyte abnormalities and pain control. Past Medical History:  Diagnosis Date   Prematurity    Retinopathy of prematurity    Seizures (HCC)    Past Surgical History:  Procedure Laterality Date   Central line placement     As premature infant   REFRACTIVE SURGERY     For retinopathy of prematurity   Social History:  reports that he has never smoked. He has been exposed to tobacco smoke. He has never used smokeless tobacco. He reports that he does not drink alcohol and  does not use drugs.  Allergies  Allergen Reactions   Concerta [Methylphenidate] Other (See Comments)    Would start crying for little things like when someone coughed or said a specific word for example.     Family History  Problem Relation Age of Onset   Diabetes type II Father    Hypercholesterolemia Father    Hypertension Father    Stroke Paternal Grandmother    Autism Brother        Also has cognitive delays   Stroke Paternal Aunt     Prior to Admission medications   Medication Sig Start Date End Date Taking? Authorizing Provider  dicyclomine (BENTYL) 10 MG capsule Take 1 capsule (10 mg total) by mouth every 8 (eight) hours as needed for spasms. 08/14/23  Yes Dionne Bucy, MD  lamoTRIgine (LAMICTAL) 200 MG tablet TAKE 1 TABLET BY MOUTH IN THE MORNING AND 2 AT NIGHT 08/18/23  Yes Goodpasture, Inetta Fermo, NP  ondansetron (ZOFRAN-ODT) 4 MG disintegrating tablet Take 1 tablet (4 mg total) by mouth every 8 (eight) hours as needed for nausea or vomiting. 08/29/23  Yes Smoot, Shawn Route, PA-C  oxyCODONE-acetaminophen (PERCOCET/ROXICET) 5-325 MG tablet Take 1 tablet by mouth every 6 (six) hours as needed for severe pain (pain score 7-10). 08/29/23  Yes Smoot, Shawn Route, PA-C  tamsulosin (FLOMAX) 0.4 MG CAPS capsule Take 1 capsule (0.4 mg total) by mouth daily. 08/29/23  Yes Smoot, Shawn Route, PA-C  zonisamide (ZONEGRAN) 100 MG capsule Take 3 capsules at bedtime 08/18/23  Yes Elveria Rising, NP  acetaminophen (  TYLENOL) 325 MG tablet Take 2 tablets (650 mg total) by mouth every 4 (four) hours as needed for mild pain (or temp >/= 99.5). Patient not taking: Reported on 05/19/2023 05/14/19   Albertine Grates, MD  aspirin EC 325 MG tablet Take 325 mg by mouth daily. Patient not taking: Reported on 05/19/2023    [provider]  Vitamin D, Ergocalciferol, (DRISDOL) 1.25 MG (50000 UNIT) CAPS capsule Take 50,000 Units by mouth once a week. Patient not taking: Reported on 09/03/2023 03/09/23   [provider]    Physical Exam: Vitals:   09/03/23 2030 09/03/23 2145 09/03/23 2315 09/04/23 0039  BP: (!) 139/100 (!) 130/95 (!) 136/102 (!) 137/90  Pulse: 86 86 85 88  Resp: 16 17 17 16   Temp: 97.9 F (36.6 C)   97.7 F (36.5 C)  TempSrc: Oral   Oral  SpO2: 100% 98% 98% 98%  Weight: 58.1 kg     Height: 5\' 9"  (1.753 m)      Constitutional: NAD, calm, comfortable, well-appearing young male sitting upright in bed Eyes: lids and conjunctivae normal ENMT: Mucous membranes are moist.  Neck: normal, supple Respiratory: clear to auscultation bilaterally, no wheezing, no crackles. Normal respiratory effort. No accessory muscle use.  Cardiovascular: Regular rate and rhythm, no murmurs / rubs / gallops. No extremity edema.   Abdomen: no tenderness, soft, no CVA tenderness  musculoskeletal: no clubbing / cyanosis. No joint deformity upper and lower extremities. Normal muscle tone.  Skin: no rashes, lesions, ulcers. No induration Neurologic: CN 2-12 grossly intact.  Cognitive delay. Psychiatric: Normal judgment and insight. Alert and oriented x 3. Normal mood. Data Reviewed:  See HPI   Assessment and Plan: * Hypokalemia -Administer both IV and oral potassium.  Follow repeat in the morning.  Hyponatremia Secondary to hypovolemia and decreased oral intake - Has received IV fluid bolus in the ED.  Will follow trend in the morning.  Nephrolithiasis - Recent CT imaging on 12/22 demonstrated obstructive 6 mm right ureter stone with right hydronephrosis.  Creatinine remained stable.  UA was negative. -Patient was really only taking 1 or 2 Percocets a day.  Discussed that he can take this every 6 hours as needed. Will continue here. IV morphine for breakthrough pain. -Continue daily Flomax -Strain all urine  Seizure (HCC) History of seizures.  Continue home Zonegran and Lamictal      Advance Care Planning: Full Consults: none  Family Communication: pt to call father  himself  Severity of Illness: The appropriate patient status for this patient is OBSERVATION. Observation status is judged to be reasonable and necessary in order to provide the required intensity of service to ensure the patient's safety. The patient's presenting symptoms, physical exam findings, and initial radiographic and laboratory data in the context of their medical condition is felt to place them at decreased risk for further clinical deterioration. Furthermore, it is anticipated that the patient will be medically stable for discharge from the hospital within 2 midnights of admission.   Author: Anselm Jungling, DO 09/04/2023 12:54 AM  For on call review [email or web address].

## 2023-09-04 ENCOUNTER — Observation Stay (HOSPITAL_COMMUNITY): Payer: Medicaid Other

## 2023-09-04 DIAGNOSIS — E871 Hypo-osmolality and hyponatremia: Secondary | ICD-10-CM | POA: Diagnosis not present

## 2023-09-04 DIAGNOSIS — E876 Hypokalemia: Secondary | ICD-10-CM | POA: Diagnosis not present

## 2023-09-04 DIAGNOSIS — N1330 Unspecified hydronephrosis: Secondary | ICD-10-CM | POA: Diagnosis not present

## 2023-09-04 DIAGNOSIS — N200 Calculus of kidney: Secondary | ICD-10-CM | POA: Diagnosis not present

## 2023-09-04 LAB — RENAL FUNCTION PANEL
Albumin: 3.2 g/dL — ABNORMAL LOW (ref 3.5–5.0)
Anion gap: 8 (ref 5–15)
BUN: 11 mg/dL (ref 6–20)
CO2: 25 mmol/L (ref 22–32)
Calcium: 8.5 mg/dL — ABNORMAL LOW (ref 8.9–10.3)
Chloride: 100 mmol/L (ref 98–111)
Creatinine, Ser: 0.9 mg/dL (ref 0.61–1.24)
GFR, Estimated: >60 mL/min (ref >60)
Glucose, Bld: 109 mg/dL — ABNORMAL HIGH (ref 70–99)
Phosphorus: 1.4 mg/dL — ABNORMAL LOW (ref 2.5–4.6)
Potassium: 3.7 mmol/L (ref 3.5–5.1)
Sodium: 133 mmol/L — ABNORMAL LOW (ref 135–145)

## 2023-09-04 LAB — BASIC METABOLIC PANEL
Anion gap: 8 (ref 5–15)
BUN: 12 mg/dL (ref 6–20)
CO2: 24 mmol/L (ref 22–32)
Calcium: 8.4 mg/dL — ABNORMAL LOW (ref 8.9–10.3)
Chloride: 101 mmol/L (ref 98–111)
Creatinine, Ser: 0.95 mg/dL (ref 0.61–1.24)
GFR, Estimated: >60 mL/min (ref >60)
Glucose, Bld: 100 mg/dL — ABNORMAL HIGH (ref 70–99)
Potassium: 2.9 mmol/L — ABNORMAL LOW (ref 3.5–5.1)
Sodium: 133 mmol/L — ABNORMAL LOW (ref 135–145)

## 2023-09-04 LAB — CBC
HCT: 36.7 % — ABNORMAL LOW (ref 39.0–52.0)
Hemoglobin: 12.9 g/dL — ABNORMAL LOW (ref 13.0–17.0)
MCH: 31.7 pg (ref 26.0–34.0)
MCHC: 35.1 g/dL (ref 30.0–36.0)
MCV: 90.2 fL (ref 80.0–100.0)
Platelets: 574 10*3/uL — ABNORMAL HIGH (ref 150–400)
RBC: 4.07 MIL/uL — ABNORMAL LOW (ref 4.22–5.81)
RDW: 13.6 % (ref 11.5–15.5)
WBC: 10.4 10*3/uL (ref 4.0–10.5)
nRBC: 0 % (ref 0.0–0.2)

## 2023-09-04 LAB — HIV ANTIBODY (ROUTINE TESTING W REFLEX): HIV Screen 4th Generation wRfx: NONREACTIVE

## 2023-09-04 LAB — MAGNESIUM: Magnesium: 2 mg/dL (ref 1.7–2.4)

## 2023-09-04 MED ORDER — ONDANSETRON HCL 4 MG/2ML IJ SOLN: 4.0000 mg | Freq: Four times a day (QID) | INTRAMUSCULAR | Status: DC | PRN

## 2023-09-04 MED ORDER — OXYCODONE-ACETAMINOPHEN 5-325 MG PO TABS: 1.0000 | ORAL_TABLET | Freq: Four times a day (QID) | ORAL | Status: DC | PRN

## 2023-09-04 MED ORDER — SODIUM PHOSPHATES 45 MMOLE/15ML IV SOLN
30.0000 mmol | Freq: Once | INTRAVENOUS | Status: AC
  Administered 2023-09-04: 30 mmol via INTRAVENOUS
  Filled 2023-09-04: qty 10

## 2023-09-04 MED ORDER — ZONISAMIDE 100 MG PO CAPS
300.0000 mg | ORAL_CAPSULE | Freq: Every day | ORAL | Status: DC
  Administered 2023-09-04 – 2023-09-05 (×3): 300 mg via ORAL
  Filled 2023-09-04 (×3): qty 3

## 2023-09-04 MED ORDER — TAMSULOSIN HCL 0.4 MG PO CAPS
0.4000 mg | ORAL_CAPSULE | Freq: Every day | ORAL | Status: DC
  Administered 2023-09-04 – 2023-09-06 (×3): 0.4 mg via ORAL
  Filled 2023-09-04 (×3): qty 1

## 2023-09-04 MED ORDER — CEFTRIAXONE SODIUM 1 G IJ SOLR
1.0000 g | INTRAMUSCULAR | Status: DC
  Administered 2023-09-04 – 2023-09-05 (×2): 1 g via INTRAVENOUS
  Filled 2023-09-04 (×2): qty 10

## 2023-09-04 MED ORDER — MORPHINE SULFATE (PF) 2 MG/ML IV SOLN: 1.0000 mg | INTRAVENOUS | Status: DC | PRN

## 2023-09-04 MED ORDER — POTASSIUM CHLORIDE CRYS ER 20 MEQ PO TBCR
40.0000 meq | EXTENDED_RELEASE_TABLET | ORAL | Status: AC
  Administered 2023-09-04 (×2): 40 meq via ORAL
  Filled 2023-09-04 (×2): qty 2

## 2023-09-04 MED ORDER — LAMOTRIGINE 100 MG PO TABS
100.0000 mg | ORAL_TABLET | Freq: Two times a day (BID) | ORAL | Status: DC
  Administered 2023-09-04 – 2023-09-06 (×6): 100 mg via ORAL
  Filled 2023-09-04 (×6): qty 1

## 2023-09-04 NOTE — Progress Notes (Signed)
PROGRESS NOTE  [name]:[id number] DOB: Jun 24, 1996   PCP: Pcp, No  Patient is from: Home  DOA: 09/03/2023 LOS: 0  Chief complaints Chief Complaint  Patient presents with   Nephrolithiasis     Brief Narrative / Interim history: 27 year old Franklin [name] with PMH of intellectual delay, epilepsy and nephrolithiasis presenting with increased right flank pain and admitted with hypokalemia and hyponatremia.  Patient was seen in ED on 12/22 with right flank pain, nausea and vomiting.  CT renal stone study showed 6 mm obstructing right ureteral stone with mild right hydronephrosis and 2 mm calculus in the urinary bladder.  He was discharged home on Percocet, Flomax and Zofran.   In ED, WBC 13.1.  Platelets 728.  NA 129.  K2.6. Cr 0.84.  UA with moderate Hgb.  Patient was started on IV fluid and potassium supplementation and admitted for overnight observation.  The next day, CT renal stone study with distal migration of obstructing right ureteral calculus now at right UVJ and progressive right-sided hydronephrosis and hydroureter with continued right renal edema consistent with high-grade obstructive uropathy.  Urology consulted.  Subjective: Seen and examined earlier this morning.  No major events overnight of this morning.  Patient has no complaints.  He denies pain, nausea, vomiting or UTI symptoms.  Not sure if he passed the stone.  Objective: Vitals:   09/04/23 0145 09/04/23 0245 09/04/23 0539 09/04/23 0959  BP: 122/87 115/74 116/78 126/81  Pulse: 89 77 77 92  Resp: 14 16 18 18   Temp:  98.4 F (36.9 C) 98.4 F (36.9 C) 98.4 F (36.9 C)  TempSrc:  Oral Oral Oral  SpO2: 98% 96% 97% 96%  Weight:      Height:        Examination:  GENERAL: No apparent distress.  Nontoxic. HEENT: MMM.  Vision and hearing grossly intact.  NECK: Supple.  No apparent JVD.  RESP:  No IWOB.  Fair aeration bilaterally. CVS:  RRR. Heart sounds normal.  ABD/GI/GU: BS+. Abd soft, NTND.  Right CVA  tenderness. MSK/EXT:  Moves extremities. No apparent deformity. No edema.  SKIN: no apparent skin lesion or wound NEURO: Awake, alert and oriented appropriately.  No apparent focal neuro deficit. PSYCH: Calm. Normal affect.   Procedures:  None  Microbiology summarized: None  Assessment and plan: Obstructive right ureterolithiasis: Patient returns with right flank pain after seen in ED for the same on 12/22.  Unclear if he was taking his Flomax.  Repeat CT renal stone study as above.  UA with moderate Hgb but no leukocytes, nitrites or bacteria.  Currently pain-free but CVA tenderness on exam. -Urology recommended spontaneous passage with Flomax and n.p.o. Sunday night and renal US Monday morning -Continue Flomax -Strain urine. -will obtain urine culture and start ceftriaxone. -Analgesics as needed   Hyponatremia: Likely hypovolemic.  Improved. -Continue monitoring  Hypokalemia: Likely due to GI loss -Monitor replenish as appropriate  History of seizure disorder -Continue home Lamictal and Zonegran  Intellectual disability: Noted     Body mass index is 18.9 kg/Franklin [name].          DVT prophylaxis:  SCDs Start: 09/04/23 0044  Code Status: Full code Family Communication: None at bedside Level of care: Med-Surg Status is: Observation The patient will require care spanning > 2 midnights and should be moved to inpatient because: Obstructive right ureteral stone and hypokalemia   Final disposition: Home Consultants:  Urology  55 minutes with more than 50% spent in reviewing records, counseling patient/family and  coordinating care.   Sch Meds:  Scheduled Meds:  lamoTRIgine  100 mg Oral BID   tamsulosin  0.4 mg Oral Daily   zonisamide  300 mg Oral QHS   Continuous Infusions: PRN Meds:.morphine injection, ondansetron (ZOFRAN) IV, oxyCODONE-acetaminophen  Antimicrobials: Anti-infectives (From admission, onward)    None        I have personally reviewed the  following labs and images: CBC: Recent Labs  Lab 08/29/23 1106 09/03/23 2038 09/04/23 0513  WBC 14.3* 13.1* 10.4  HGB 15.5 14.7 12.9*  HCT 44.1 Franklin.2 36.7*  MCV 91.7 89.4 90.2  PLT 565* 728* 574*   BMP &GFR Recent Labs  Lab 08/29/23 1106 09/03/23 2038 09/04/23 0513  NA 138 129* 133*  K 3.1* 2.6* 2.9*  CL 104 92* 101  CO2 23 26 24   GLUCOSE 121* 108* 100*  BUN 11 12 12   CREATININE 0.85 0.84 0.95  CALCIUM 9.4 8.8* 8.4*  MG  --  2.2 2.0   Estimated Creatinine Clearance: 94.3 mL/min (by C-G formula based on SCr of 0.95 mg/dL). Liver & Pancreas: No results for input(s): "AST", "ALT", "ALKPHOS", "BILITOT", "PROT", "ALBUMIN" in the last 168 hours. No results for input(s): "LIPASE", "AMYLASE" in the last 168 hours. No results for input(s): "AMMONIA" in the last 168 hours. Diabetic: No results for input(s): "HGBA1C" in the last 72 hours. No results for input(s): "GLUCAP" in the last 168 hours. Cardiac Enzymes: No results for input(s): "CKTOTAL", "CKMB", "CKMBINDEX", "TROPONINI" in the last 168 hours. No results for input(s): "PROBNP" in the last 8760 hours. Coagulation Profile: No results for input(s): "INR", "PROTIME" in the last 168 hours. Thyroid Function Tests: No results for input(s): "TSH", "T4TOTAL", "FREET4", "T3FREE", "THYROIDAB" in the last 72 hours. Lipid Profile: No results for input(s): "CHOL", "HDL", "LDLCALC", "TRIG", "CHOLHDL", "LDLDIRECT" in the last 72 hours. Anemia Panel: No results for input(s): "VITAMINB12", "FOLATE", "FERRITIN", "TIBC", "IRON", "RETICCTPCT" in the last 72 hours. Urine analysis:    Component Value Date/Time   COLORURINE STRAW (A) 09/03/2023 2046   APPEARANCEUR CLEAR 09/03/2023 2046   LABSPEC 1.003 (L) 09/03/2023 2046   PHURINE 7.0 09/03/2023 2046   GLUCOSEU NEGATIVE 09/03/2023 2046   HGBUR MODERATE (A) 09/03/2023 2046   BILIRUBINUR NEGATIVE 09/03/2023 2046   KETONESUR 5 (A) 09/03/2023 2046   PROTEINUR NEGATIVE 09/03/2023 2046    UROBILINOGEN 0.2 06/30/2015 1900   NITRITE NEGATIVE 09/03/2023 2046   LEUKOCYTESUR NEGATIVE 09/03/2023 2046   Sepsis Labs: Invalid input(s): "PROCALCITONIN", "LACTICIDVEN"  Microbiology: No results found for this or any previous visit (from the past 240 hours).  Radiology Studies: CT RENAL STONE STUDY Result Date: 09/04/2023 CLINICAL DATA:  Symptomatic urolithiasis EXAM: CT ABDOMEN AND PELVIS WITHOUT CONTRAST TECHNIQUE: Multidetector CT imaging of the abdomen and pelvis was performed following the standard protocol without IV contrast. RADIATION DOSE REDUCTION: This exam was performed according to the departmental dose-optimization program which includes automated exposure control, adjustment of the mA and/or kV according to patient size and/or use of iterative reconstruction technique. COMPARISON:  08/29/2023 FINDINGS: Lower chest: No acute pleural or parenchymal lung disease. Hepatobiliary: Unremarkable unenhanced appearance of the liver and gallbladder. Pancreas: Unremarkable unenhanced appearance. Spleen: Unremarkable unenhanced appearance. Adrenals/Urinary Tract: There has been distal migration of the obstructing 5 mm ureteral calculus seen previously, now located at the right UVJ reference image 71/2. There is increased right-sided hydronephrosis and hydroureter, with continued right renal parenchymal edema. The left kidney is unremarkable. The adrenals are stable. Bladder is minimally distended without focal abnormality. The  2 mm bladder calculus seen previously appears to have passed in the interim. Stomach/Bowel: No bowel obstruction or ileus. Normal appendix right lower quadrant. No bowel wall thickening or inflammatory change. Vascular/Lymphatic: Multiple subcentimeter mesenteric lymph nodes are again identified, nonspecific. No pathologically enlarged lymph nodes. No significant vascular findings on this unenhanced exam. Reproductive: Prostate is unremarkable. Other: No free fluid or free  intraperitoneal gas. No abdominal wall hernia. Musculoskeletal: No acute or destructive bony abnormalities. Reconstructed images demonstrate no additional findings. IMPRESSION: 1. Distal migration of the obstructing right ureteral calculus seen previously, now located at the right UVJ. Progressive right-sided hydronephrosis and hydroureter, with continued right renal edema, consistent with high-grade obstructive uropathy. 2. Interval passage of the 2 mm bladder calculus seen previously. 3. Stable borderline enlarged mesenteric lymph nodes, nonspecific but likely reactive. Electronically Signed   By: Sharlet Salina Franklin [name].D.   On: 09/04/2023 15:19      Crystle Carelli T. Tenise Stetler [facility]  If 7PM-7AM, please contact night-coverage [email or web address] 09/04/2023, 3:27 PM

## 2023-09-04 NOTE — Consult Note (Addendum)
I have been asked to see the patient by Dr. Alanda Slim, for evaluation and management of Right ureteral stone .  History of present illness: 27 year old male initially presented to the ER on 12/22 with a right 5 mm ureteral calculus he was discharged home on trial of passage and represented to the ER on 12/27.  Repeat CT done today demonstrated that the stone has moved from the proximal ureter to the UVJ.  The patient is not having much pain his labs are normal he denies any nausea or vomiting his already tolerated lunch today.  Urology was called the afternoon about management.  Patient has never had kidney stones denies a history of surgery.  Patient does have a history of seizure disorder which she is on Lamictal and Zonegran.  Zonegran was changed in May 2024.  Patient also has a significant learning disability.   Review of systems: A 12 point comprehensive review of systems was obtained and is negative unless otherwise stated in the history of present illness.  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   Body mass index (BMI) of 20.0-20.9 in adult 08/17/2022   Does not have health insurance 10/24/2021   Adjustment disorder with mixed anxiety and depressed mood 09/10/2021   Poor sleep 03/01/2021   Generalized anxiety disorder 04/10/2020   Loss of weight 04/10/2020   Seizure (HCC) 05/13/2019   Hypokalemia 05/12/2019   Hypomagnesemia 05/12/2019   Hypocalcemia 05/12/2019   Intellectual delay 03/10/2018   Personal history of prematurity 03/10/2018   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   Current  Outpatient Medications on File Prior to Encounter  Medication Sig Dispense Refill   dicyclomine (BENTYL) 10 MG capsule Take 1 capsule (10 mg total) by mouth every 8 (eight) hours as needed for spasms. 12 capsule 0   lamoTRIgine (LAMICTAL) 200 MG tablet TAKE 1 TABLET BY MOUTH IN THE MORNING AND 2 AT NIGHT 90 tablet 5   ondansetron (ZOFRAN-ODT) 4 MG disintegrating tablet Take 1 tablet (4 mg total) by mouth every 8 (eight) hours as needed for nausea or vomiting. 20 tablet 0   oxyCODONE-acetaminophen (PERCOCET/ROXICET) 5-325 MG tablet Take 1 tablet by mouth every 6 (six) hours as needed for severe pain (pain score 7-10). 15 tablet 0   tamsulosin (FLOMAX) 0.4 MG CAPS capsule Take 1 capsule (0.4 mg total) by mouth daily. 30 capsule 0   zonisamide (ZONEGRAN) 100 MG capsule Take 3 capsules at bedtime 90 capsule 5   acetaminophen (TYLENOL) 325 MG tablet Take 2 tablets (650 mg total) by mouth every 4 (four) hours as needed for mild pain (or temp >/= 99.5). (Patient not taking: Reported on 05/19/2023) 30 tablet 0   aspirin EC 325 MG tablet Take 325 mg by mouth daily. (Patient not taking: Reported on 05/19/2023)     Vitamin D, Ergocalciferol, (DRISDOL) 1.25 MG (50000 UNIT) CAPS capsule Take 50,000 Units by mouth once a week. (Patient not taking: Reported on 09/03/2023)      Past Medical History:  Diagnosis Date   Prematurity    Retinopathy of prematurity    Seizures (HCC)     Past Surgical History:  Procedure Laterality  Date   Central line placement     As premature infant   REFRACTIVE SURGERY     For retinopathy of prematurity    Social History   Tobacco Use   Smoking status: Never    Passive exposure: Current (Cousin smokes in home)   Smokeless tobacco: Never  Vaping Use   Vaping status: Some Days   Substances: Flavoring  Substance Use Topics   Alcohol use: Never   Drug use: Never    Family History  Problem Relation Age of Onset   Diabetes type II Father    Hypercholesterolemia  Father    Hypertension Father    Stroke Paternal Grandmother    Autism Brother        Also has cognitive delays   Stroke Paternal Aunt     PE: Vitals:   09/04/23 0145 09/04/23 0245 09/04/23 0539 09/04/23 0959  BP: 122/87 115/74 116/78 126/81  Pulse: 89 77 77 92  Resp: 14 16 18 18   Temp:  98.4 F (36.9 C) 98.4 F (36.9 C) 98.4 F (36.9 C)  TempSrc:  Oral Oral Oral  SpO2: 98% 96% 97% 96%  Weight:      Height:       Patient appears to be in no acute distress  Abdomen soft nontender nondistended Respiratory normal breathing room air Cardiac regular rate per monitor. GU: Minimal right CVA tenderness no Foley in place  Recent Labs    09/03/23 2038 09/04/23 0513  WBC 13.1* 10.4  HGB 14.7 12.9*  HCT 42.2 36.7*   Recent Labs    09/03/23 2038 09/04/23 0513 09/04/23 1522  NA 129* 133* 133*  K 2.6* 2.9* 3.7  CL 92* 101 100  CO2 26 24 25   GLUCOSE 108* 100* 109*  BUN 12 12 11   CREATININE 0.84 0.95 0.90  CALCIUM 8.8* 8.4* 8.5*   No results for input(s): "LABPT", "INR" in the last 72 hours. No results for input(s): "LABURIN" in the last 72 hours. Results for orders placed or performed during the hospital encounter of 04/13/21  SARS CORONAVIRUS 2 (TAT 6-24 HRS) Nasopharyngeal Nasopharyngeal Swab     Status: Abnormal   Collection Time: 04/14/21 12:57 AM   Specimen: Nasopharyngeal Swab  Result Value Ref Range Status   SARS Coronavirus 2 POSITIVE (A) NEGATIVE Final    Comment: (NOTE) SARS-CoV-2 target nucleic acids are DETECTED.  The SARS-CoV-2 RNA is generally detectable in upper and lower respiratory specimens during the acute phase of infection. Positive results are indicative of the presence of SARS-CoV-2 RNA. Clinical correlation with patient history and other diagnostic information is  necessary to determine patient infection status. Positive results do not rule out bacterial infection or co-infection with other viruses.  The expected result is Negative.  Fact  Sheet for Patients: HairSlick.no  Fact Sheet for Healthcare Providers: quierodirigir.com  This test is not yet approved or cleared by the [place] FDA and  has been authorized for detection and/or diagnosis of SARS-CoV-2 by FDA under an Emergency Use Authorization (EUA). This EUA will remain  in effect (meaning this test can be used) for the duration of the COVID-19 declaration under Section 564(b)(1) of the Act, 21 U. S.C. section 360bbb-3(b)(1), unless the authorization is terminated or revoked sooner.   Performed at [facility], [address], [place], [place] [address]     Imaging: CT 12/28 IMPRESSION: 1. Distal migration of the obstructing right ureteral calculus seen previously, now located at the right UVJ. Progressive right-sided  hydronephrosis and hydroureter, with continued right renal edema, consistent with high-grade obstructive uropathy. 2. Interval passage of the 2 mm bladder calculus seen previously. 3. Stable borderline enlarged mesenteric lymph nodes, nonspecific but likely reactive.   Imp: 27 year old male with right distal ureteral stone currently asymptomatic patient is already been admitted by medicine team.  Since patient is already been admitted we will plan to do trial of passage until Monday as there is no urgent need for intervention. Patient had eaten lunch after I was called.  Recommendations: Will attempt trial of passage over the next 2 days Continue tamsulosin Recommend vigorous hydration as well as IV fluids to help pass the stone Make n.p.o. on Sunday night 12/29 Order renal ultrasound Monday 12/30 if hydronephrosis noted will plan for right ureteroscopy with laser lithotripsy.   Thank you for involving me in this patient's care, I will continue to follow along.Please page with any further questions or concerns. Adonis Brook

## 2023-09-04 NOTE — Assessment & Plan Note (Signed)
-   Recent CT imaging on 12/22 demonstrated obstructive 6 mm right ureter stone with right hydronephrosis.  Creatinine remained stable.  UA was negative. -Patient was really only taking 1 or 2 Percocets a day.  Discussed that he can take this every 6 hours as needed. Will continue here. IV morphine for breakthrough pain. -Continue daily Flomax -Strain all urine

## 2023-09-04 NOTE — Assessment & Plan Note (Signed)
Secondary to hypovolemia and decreased oral intake - Has received IV fluid bolus in the ED.  Will follow trend in the morning.

## 2023-09-04 NOTE — ED Notes (Signed)
..ED TO INPATIENT HANDOFF REPORT  Name/Age/Gender Franklin [name] 27 y.o. male  Code Status    Code Status Orders  (From admission, onward)           Start     Ordered   09/04/23 0045  Full code  Continuous       Question:  By:  Answer:  Consent: discussion documented in EHR   09/04/23 0044           Code Status History     Date Active Date Inactive Code Status Order ID Comments User Context   05/12/2019 2216 05/14/2019 1601 Full Code [id number]  Charlsie Quest, MD ED       Home/SNF/Other Home  Chief Complaint Hypokalemia [E87.6]  Level of Care/Admitting Diagnosis ED Disposition     ED Disposition  Admit   Condition  --   Comment  Hospital Area: [facility] [[id number]]  Level of Care: Med-Surg [16]  May place patient in observation at [facility] or [facility] if equivalent level of care is available:: No  Covid Evaluation: Asymptomatic - no recent exposure (last 10 days) testing not required  Diagnosis: Hypokalemia [172180]  Admitting Physician: Anselm Jungling [[id number]]  Attending Physician: Anselm Jungling [[id number]]          Medical History Past Medical History:  Diagnosis Date   Prematurity    Retinopathy of prematurity    Seizures (HCC)     Allergies Allergies  Allergen Reactions   Concerta [Methylphenidate] Other (See Comments)    Would start crying for little things like when someone coughed or said a specific word for example.     IV Location/Drains/Wounds Patient Lines/Drains/Airways Status     Active Line/Drains/Airways     Name Placement date Placement time Site Days   Peripheral IV 09/03/23 20 G Anterior;Right Antecubital 09/03/23  2035  Antecubital  1            Labs/Imaging Results for orders placed or performed during the hospital encounter of 09/03/23 (from the past 48 hours)  CBC     Status: Abnormal   Collection Time: 09/03/23  8:38 PM  Result Value Ref Range   WBC 13.1 (H) 4.0 - 10.5 K/uL   RBC 4.72  4.22 - 5.81 MIL/uL   Hemoglobin 14.7 13.0 - 17.0 g/dL   HCT 95.1 88.4 - 16.6 %   MCV 89.4 80.0 - 100.0 fL   MCH 31.1 26.0 - 34.0 pg   MCHC 34.8 30.0 - 36.0 g/dL   RDW 06.3 01.6 - 01.0 %   Platelets 728 (H) 150 - 400 K/uL   nRBC 0.0 0.0 - 0.2 %    Comment: Performed at [facility], [address] [place], [place] [address]  Basic metabolic panel     Status: Abnormal   Collection Time: 09/03/23  8:38 PM  Result Value Ref Range   Sodium 129 (L) 135 - 145 mmol/L   Potassium 2.6 (LL) 3.5 - 5.1 mmol/L    Comment: CRITICAL RESULT CALLED TO, READ BACK BY AND VERIFIED WITH Auston Halfmann, E RN AT 2110 09/03/23 TUCKER, J    Chloride 92 (L) 98 - 111 mmol/L   CO2 26 22 - 32 mmol/L   Glucose, Bld 108 (H) 70 - 99 mg/dL    Comment: Glucose reference range applies only to samples taken after fasting for at least 8 hours.   BUN 12 6 - 20 mg/dL   Creatinine, Ser 5.73 0.61 -  1.24 mg/dL   Calcium 8.8 (L) 8.9 - 10.3 mg/dL   GFR, Estimated >40 >10 mL/min    Comment: (NOTE) Calculated using the CKD-EPI Creatinine Equation (2021)    Anion gap 11 5 - 15    Comment: Performed at [facility], [address], [place], [place] [address]  Magnesium     Status: None   Collection Time: 09/03/23  8:38 PM  Result Value Ref Range   Magnesium 2.2 1.7 - 2.4 mg/dL    Comment: Performed at [facility], [address], [place], [place] [address]  Urinalysis, Routine w reflex microscopic -Urine, Clean Catch     Status: Abnormal   Collection Time: 09/03/23  8:46 PM  Result Value Ref Range   Color, Urine STRAW (A) YELLOW   APPearance CLEAR CLEAR   Specific Gravity, Urine 1.003 (L) 1.005 - 1.030   pH 7.0 5.0 - 8.0   Glucose, UA NEGATIVE NEGATIVE mg/dL   Hgb urine dipstick MODERATE (A) NEGATIVE   Bilirubin Urine NEGATIVE NEGATIVE   Ketones, ur 5 (A) NEGATIVE mg/dL   Protein, ur NEGATIVE NEGATIVE mg/dL   Nitrite NEGATIVE NEGATIVE   Leukocytes,Ua NEGATIVE  NEGATIVE   RBC / HPF 0-5 0 - 5 RBC/hpf   WBC, UA 0-5 0 - 5 WBC/hpf   Bacteria, UA NONE SEEN NONE SEEN   Squamous Epithelial / HPF 0-5 0 - 5 /HPF    Comment: Performed at [facility], [address], [place], [place] [address]   No results found.  Pending Labs Unresulted Labs (From admission, onward)     Start     Ordered   09/04/23 0500  CBC  Tomorrow morning,   R        09/04/23 0044   09/04/23 0500  Basic metabolic panel  Tomorrow morning,   R        09/04/23 0044   09/04/23 0044  HIV Antibody (routine testing w rflx)  (HIV Antibody (Routine testing w reflex) panel)  Once,   R        09/04/23 0044            Vitals/Pain Today's Vitals   09/03/23 2351 09/04/23 0039 09/04/23 0130 09/04/23 0145  BP:  (!) 137/90 (!) 120/95 122/87  Pulse:  88 89 89  Resp:  16  14  Temp:  97.7 F (36.5 C)    TempSrc:  Oral    SpO2:  98% 97% 98%  Weight:      Height:      PainSc: 0-No pain       Isolation Precautions No active isolations  Medications Medications  tamsulosin (FLOMAX) capsule 0.4 mg (has no administration in time range)  lamoTRIgine (LAMICTAL) tablet 100 mg (100 mg Oral Given 09/04/23 0124)  zonisamide (ZONEGRAN) capsule 300 mg (300 mg Oral Given 09/04/23 0123)  oxyCODONE-acetaminophen (PERCOCET/ROXICET) 5-325 MG per tablet 1 tablet (has no administration in time range)  morphine (PF) 2 MG/ML injection 1 mg (has no administration in time range)  ondansetron (ZOFRAN) injection 4 mg (has no administration in time range)  HYDROmorphone (DILAUDID) injection 1 mg (1 mg Intravenous Given 09/03/23 2059)  ondansetron (ZOFRAN) injection 4 mg (4 mg Intravenous Given 09/03/23 2100)  potassium chloride SA (KLOR-CON M) CR tablet 40 mEq (40 mEq Oral Given 09/03/23 2145)  potassium chloride 10 mEq in 100 mL IVPB (0 mEq Intravenous Stopped 09/04/23 0051)  ketorolac (TORADOL) 30 MG/ML injection 30 mg (30 mg Intravenous Given  09/03/23 2246)  HYDROmorphone  (DILAUDID) injection 1 mg (1 mg Intravenous Given 09/03/23 2246)  sodium chloride 0.9 % bolus 1,000 mL (0 mLs Intravenous Stopped 09/04/23 0101)    Mobility walks

## 2023-09-04 NOTE — Assessment & Plan Note (Signed)
-  Administer both IV and oral potassium.  Follow repeat in the morning.

## 2023-09-04 NOTE — Assessment & Plan Note (Signed)
History of seizures.  Continue home Zonegran and Lamictal

## 2023-09-05 DIAGNOSIS — E871 Hypo-osmolality and hyponatremia: Secondary | ICD-10-CM | POA: Diagnosis present

## 2023-09-05 DIAGNOSIS — Z79899 Other long term (current) drug therapy: Secondary | ICD-10-CM | POA: Diagnosis not present

## 2023-09-05 DIAGNOSIS — N200 Calculus of kidney: Secondary | ICD-10-CM | POA: Diagnosis not present

## 2023-09-05 DIAGNOSIS — N201 Calculus of ureter: Principal | ICD-10-CM | POA: Diagnosis present

## 2023-09-05 DIAGNOSIS — D75839 Thrombocytosis, unspecified: Secondary | ICD-10-CM | POA: Diagnosis present

## 2023-09-05 DIAGNOSIS — Z8249 Family history of ischemic heart disease and other diseases of the circulatory system: Secondary | ICD-10-CM | POA: Diagnosis not present

## 2023-09-05 DIAGNOSIS — G40909 Epilepsy, unspecified, not intractable, without status epilepticus: Secondary | ICD-10-CM | POA: Diagnosis present

## 2023-09-05 DIAGNOSIS — Z83438 Family history of other disorder of lipoprotein metabolism and other lipidemia: Secondary | ICD-10-CM | POA: Diagnosis not present

## 2023-09-05 DIAGNOSIS — E861 Hypovolemia: Secondary | ICD-10-CM | POA: Diagnosis present

## 2023-09-05 DIAGNOSIS — F17290 Nicotine dependence, other tobacco product, uncomplicated: Secondary | ICD-10-CM | POA: Diagnosis present

## 2023-09-05 DIAGNOSIS — N1330 Unspecified hydronephrosis: Secondary | ICD-10-CM | POA: Diagnosis not present

## 2023-09-05 DIAGNOSIS — F819 Developmental disorder of scholastic skills, unspecified: Secondary | ICD-10-CM | POA: Diagnosis present

## 2023-09-05 DIAGNOSIS — F79 Unspecified intellectual disabilities: Secondary | ICD-10-CM | POA: Diagnosis present

## 2023-09-05 DIAGNOSIS — Z888 Allergy status to other drugs, medicaments and biological substances status: Secondary | ICD-10-CM | POA: Diagnosis not present

## 2023-09-05 DIAGNOSIS — N210 Calculus in bladder: Secondary | ICD-10-CM | POA: Diagnosis present

## 2023-09-05 DIAGNOSIS — Z818 Family history of other mental and behavioral disorders: Secondary | ICD-10-CM | POA: Diagnosis not present

## 2023-09-05 DIAGNOSIS — E876 Hypokalemia: Secondary | ICD-10-CM | POA: Diagnosis present

## 2023-09-05 DIAGNOSIS — D72829 Elevated white blood cell count, unspecified: Secondary | ICD-10-CM | POA: Diagnosis present

## 2023-09-05 DIAGNOSIS — Z833 Family history of diabetes mellitus: Secondary | ICD-10-CM | POA: Diagnosis not present

## 2023-09-05 DIAGNOSIS — Z823 Family history of stroke: Secondary | ICD-10-CM | POA: Diagnosis not present

## 2023-09-05 DIAGNOSIS — N132 Hydronephrosis with renal and ureteral calculous obstruction: Secondary | ICD-10-CM | POA: Diagnosis present

## 2023-09-05 LAB — CBC
HCT: 38.8 % — ABNORMAL LOW (ref 39.0–52.0)
Hemoglobin: 13.6 g/dL (ref 13.0–17.0)
MCH: 31.9 pg (ref 26.0–34.0)
MCHC: 35.1 g/dL (ref 30.0–36.0)
MCV: 90.9 fL (ref 80.0–100.0)
Platelets: 604 10*3/uL — ABNORMAL HIGH (ref 150–400)
RBC: 4.27 MIL/uL (ref 4.22–5.81)
RDW: 13.9 % (ref 11.5–15.5)
WBC: 12.7 10*3/uL — ABNORMAL HIGH (ref 4.0–10.5)
nRBC: 0 % (ref 0.0–0.2)

## 2023-09-05 LAB — RENAL FUNCTION PANEL
Albumin: 3.2 g/dL — ABNORMAL LOW (ref 3.5–5.0)
Anion gap: 11 (ref 5–15)
BUN: 16 mg/dL (ref 6–20)
CO2: 22 mmol/L (ref 22–32)
Calcium: 8.6 mg/dL — ABNORMAL LOW (ref 8.9–10.3)
Chloride: 99 mmol/L (ref 98–111)
Creatinine, Ser: 0.78 mg/dL (ref 0.61–1.24)
GFR, Estimated: >60 mL/min (ref >60)
Glucose, Bld: 94 mg/dL (ref 70–99)
Phosphorus: 2.7 mg/dL (ref 2.5–4.6)
Potassium: 3 mmol/L — ABNORMAL LOW (ref 3.5–5.1)
Sodium: 132 mmol/L — ABNORMAL LOW (ref 135–145)

## 2023-09-05 LAB — MAGNESIUM: Magnesium: 2 mg/dL (ref 1.7–2.4)

## 2023-09-05 MED ORDER — POTASSIUM CHLORIDE CRYS ER 20 MEQ PO TBCR
40.0000 meq | EXTENDED_RELEASE_TABLET | ORAL | Status: AC
  Administered 2023-09-05 (×2): 40 meq via ORAL
  Filled 2023-09-05 (×2): qty 2

## 2023-09-05 MED ORDER — POTASSIUM CHLORIDE IN NACL 20-0.9 MEQ/L-% IV SOLN
INTRAVENOUS | Status: AC
  Filled 2023-09-05 (×3): qty 1000

## 2023-09-05 NOTE — Progress Notes (Signed)
°  Subjective: Feels good this AM.  No flank pain.  No stone passed.    Objective: Vital signs in last 24 hours: Temp:  [97.4 F (36.3 C)-98.5 F (36.9 C)] 97.4 F (36.3 C) (12/29 0529) Pulse Rate:  [71-96] 71 (12/29 0529) Resp:  [16-18] 16 (12/29 0529) BP: (118-126)/(76-85) 118/85 (12/29 0529) SpO2:  [96 %-97 %] 97 % (12/29 0529)  Intake/Output from previous day: 12/28 0701 - 12/29 0700 In: 1241.9 [P.O.:960; IV Piggyback:281.9] Out: 2150 [Urine:2150] Intake/Output this shift: Total I/O In: -  Out: 350 [Urine:350]  Physical Exam:  General: Alert and oriented CV: RRR Lungs: Clear Abdomen: Soft, ND, no CVAT Ext: NT, No erythema  Lab Results: Recent Labs    09/03/23 2038 09/04/23 0513 09/05/23 0423  HGB 14.7 12.9* 13.6  HCT 42.2 36.7* 38.8*   BMET Recent Labs    09/04/23 1522 09/05/23 0423  NA 133* 132*  K 3.7 3.0*  CL 100 99  CO2 25 22  GLUCOSE 109* 94  BUN 11 16  CREATININE 0.90 0.78  CALCIUM 8.5* 8.6*     Studies/Results: CT RENAL STONE STUDY Result Date: 09/04/2023 CLINICAL DATA:  Symptomatic urolithiasis EXAM: CT ABDOMEN AND PELVIS WITHOUT CONTRAST TECHNIQUE: Multidetector CT imaging of the abdomen and pelvis was performed following the standard protocol without IV contrast. RADIATION DOSE REDUCTION: This exam was performed according to the departmental dose-optimization program which includes automated exposure control, adjustment of the mA and/or kV according to patient size and/or use of iterative reconstruction technique. COMPARISON:  08/29/2023 FINDINGS: Lower chest: No acute pleural or parenchymal lung disease. Hepatobiliary: Unremarkable unenhanced appearance of the liver and gallbladder. Pancreas: Unremarkable unenhanced appearance. Spleen: Unremarkable unenhanced appearance. Adrenals/Urinary Tract: There has been distal migration of the obstructing 5 mm ureteral calculus seen previously, now located at the right UVJ reference image 71/2. There is  increased right-sided hydronephrosis and hydroureter, with continued right renal parenchymal edema. The left kidney is unremarkable. The adrenals are stable. Bladder is minimally distended without focal abnormality. The 2 mm bladder calculus seen previously appears to have passed in the interim. Stomach/Bowel: No bowel obstruction or ileus. Normal appendix right lower quadrant. No bowel wall thickening or inflammatory change. Vascular/Lymphatic: Multiple subcentimeter mesenteric lymph nodes are again identified, nonspecific. No pathologically enlarged lymph nodes. No significant vascular findings on this unenhanced exam. Reproductive: Prostate is unremarkable. Other: No free fluid or free intraperitoneal gas. No abdominal wall hernia. Musculoskeletal: No acute or destructive bony abnormalities. Reconstructed images demonstrate no additional findings. IMPRESSION: 1. Distal migration of the obstructing right ureteral calculus seen previously, now located at the right UVJ. Progressive right-sided hydronephrosis and hydroureter, with continued right renal edema, consistent with high-grade obstructive uropathy. 2. Interval passage of the 2 mm bladder calculus seen previously. 3. Stable borderline enlarged mesenteric lymph nodes, nonspecific but likely reactive. Electronically Signed   By: Sharlet Salina M.D.   On: 09/04/2023 15:19    Assessment/Plan: Right distal ureteral calculus - currently asymptomatic  Continue attempt at spontaneous passage Continue tamsulosin Strain urine Renal U/S in AM NPO at MN for possible surgical management tomorrow   LOS: 0 days   Di Kindle 09/05/2023, 9:33 AM

## 2023-09-05 NOTE — Progress Notes (Signed)
PROGRESS NOTE  Franklin [name] WUJ:[id number] DOB: [birth year]   PCP: Pcp, No  Patient is from: Home  DOA: 09/03/2023 LOS: 0  Chief complaints Chief Complaint  Patient presents with   Nephrolithiasis     Brief Narrative / Interim history: 27 year old M with PMH of intellectual delay, epilepsy and nephrolithiasis presenting with increased right flank pain and admitted with hypokalemia and hyponatremia.  Patient was seen in ED on 12/22 with right flank pain, nausea and vomiting.  CT renal stone study showed 6 mm obstructing right ureteral stone with mild right hydronephrosis and 2 mm calculus in the urinary bladder.  He was discharged home on Percocet, Flomax and Zofran.   In ED, WBC 13.1.  Platelets 728.  NA 129.  K2.6. Cr 0.84.  UA with moderate Hgb.  Patient was started on IV fluid and potassium supplementation and admitted for overnight observation.  The next day, CT renal stone study with distal migration of obstructing right ureteral calculus now at right UVJ and progressive right-sided hydronephrosis and hydroureter with continued right renal edema consistent with high-grade obstructive uropathy.  Urology consulted and following.  Subjective: Seen and examined earlier this morning.  No major events overnight of this morning.  No complaints.  Straining urine.  Objective: Vitals:   09/04/23 0539 09/04/23 0959 09/04/23 2034 09/05/23 0529  BP: 116/78 126/81 125/76 118/85  Pulse: 77 92 96 71  Resp: 18 18 16 16   Temp: 98.4 F (36.9 C) 98.4 F (36.9 C) 98.5 F (36.9 C) (!) 97.4 F (36.3 C)  TempSrc: Oral Oral Oral Oral  SpO2: 97% 96% 96% 97%  Weight:      Height:        Examination:  GENERAL: No apparent distress.  Nontoxic. HEENT: MMM.  Poor dentition.  Vision and hearing grossly intact.  NECK: Supple.  No apparent JVD.  RESP:  No IWOB.  Fair aeration bilaterally. CVS:  RRR. Heart sounds normal.  ABD/GI/GU: BS+. Abd soft, NTND.  No CVA tenderness. MSK/EXT:  Moves  extremities. No apparent deformity. No edema.  SKIN: no apparent skin lesion or wound NEURO: Awake, alert and oriented appropriately.  No apparent focal neuro deficit. PSYCH: Calm. Normal affect.   Procedures:  None  Microbiology summarized: None  Assessment and plan: Obstructive right ureterolithiasis: Patient returns with right flank pain after seen in ED for the same on 12/22.  Unclear if he was taking his Flomax.  Repeat CT renal stone study as above.  UA with moderate Hgb but no leukocytes, nitrites or bacteria.  Had right CVA tenderness on 12/28.  Currently pain-free.  Has mild leukocytosis and thrombocytosis.  Renal function stable. -Appreciate help by urology -Continue Flomax, straining urine and IV fluids -Continue IV ceftriaxone pending urine culture -N.p.o. after midnight for possible surgical intervention on 12/30. -Analgesics as needed   Hyponatremia: Likely hypovolemic.  Stable -IV fluid as above -Continue monitoring  Hypokalemia: Likely due to GI loss -Monitor replenish as appropriate  History of seizure disorder -Continue home Lamictal and Zonegran  Intellectual disability: Noted     Body mass index is 18.9 kg/m.          DVT prophylaxis:  SCDs Start: 09/04/23 0044  Code Status: Full code Family Communication: None at bedside Level of care: Med-Surg Status is: Observation The patient will require care spanning > 2 midnights and should be moved to inpatient because: Obstructive right ureteral stone with progressive hydronephrosis and hypokalemia   Final disposition: Home Consultants:  Urology  55  minutes with more than 50% spent in reviewing records, counseling patient/family and coordinating care.   Sch Meds:  Scheduled Meds:  lamoTRIgine  100 mg Oral BID   potassium chloride  40 mEq Oral Q3H   tamsulosin  0.4 mg Oral Daily   zonisamide  300 mg Oral QHS   Continuous Infusions:  0.9 % NaCl with KCl 20 mEq / L 100 mL/hr at 09/05/23 0816    cefTRIAXone (ROCEPHIN)  IV 1 g (09/04/23 1641)   PRN Meds:.morphine injection, ondansetron (ZOFRAN) IV, oxyCODONE-acetaminophen  Antimicrobials: Anti-infectives (From admission, onward)    Start     Dose/Rate Route Frequency Ordered Stop   09/04/23 1700  cefTRIAXone (ROCEPHIN) 1 g in sodium chloride 0.9 % 100 mL IVPB        1 g 200 mL/hr over 30 Minutes Intravenous Every 24 hours 09/04/23 1537          I have personally reviewed the following labs and images: CBC: Recent Labs  Lab 09/03/23 2038 09/04/23 0513 09/05/23 0423  WBC 13.1* 10.4 12.7*  HGB 14.7 12.9* 13.6  HCT 42.2 36.7* 38.8*  MCV 89.4 90.2 90.9  PLT 728* 574* 604*   BMP &GFR Recent Labs  Lab 09/03/23 2038 09/04/23 0513 09/04/23 1522 09/05/23 0423  NA 129* 133* 133* 132*  K 2.6* 2.9* 3.7 3.0*  CL 92* 101 100 99  CO2 26 24 25 22   GLUCOSE 108* 100* 109* 94  BUN 12 12 11 16   CREATININE 0.84 0.95 0.90 0.78  CALCIUM 8.8* 8.4* 8.5* 8.6*  MG 2.2 2.0  --  2.0  PHOS  --   --  1.4* 2.7   Estimated Creatinine Clearance: 112 mL/min (by C-G formula based on SCr of 0.78 mg/dL). Liver & Pancreas: Recent Labs  Lab 09/04/23 1522 09/05/23 0423  ALBUMIN 3.2* 3.2*   No results for input(s): "LIPASE", "AMYLASE" in the last 168 hours. No results for input(s): "AMMONIA" in the last 168 hours. Diabetic: No results for input(s): "HGBA1C" in the last 72 hours. No results for input(s): "GLUCAP" in the last 168 hours. Cardiac Enzymes: No results for input(s): "CKTOTAL", "CKMB", "CKMBINDEX", "TROPONINI" in the last 168 hours. No results for input(s): "PROBNP" in the last 8760 hours. Coagulation Profile: No results for input(s): "INR", "PROTIME" in the last 168 hours. Thyroid Function Tests: No results for input(s): "TSH", "T4TOTAL", "FREET4", "T3FREE", "THYROIDAB" in the last 72 hours. Lipid Profile: No results for input(s): "CHOL", "HDL", "LDLCALC", "TRIG", "CHOLHDL", "LDLDIRECT" in the last 72 hours. Anemia  Panel: No results for input(s): "VITAMINB12", "FOLATE", "FERRITIN", "TIBC", "IRON", "RETICCTPCT" in the last 72 hours. Urine analysis:    Component Value Date/Time   COLORURINE STRAW (A) 09/03/2023 2046   APPEARANCEUR CLEAR 09/03/2023 2046   LABSPEC 1.003 (L) 09/03/2023 2046   PHURINE 7.0 09/03/2023 2046   GLUCOSEU NEGATIVE 09/03/2023 2046   HGBUR MODERATE (A) 09/03/2023 2046   BILIRUBINUR NEGATIVE 09/03/2023 2046   KETONESUR 5 (A) 09/03/2023 2046   PROTEINUR NEGATIVE 09/03/2023 2046   UROBILINOGEN 0.2 06/30/2015 1900   NITRITE NEGATIVE 09/03/2023 2046   LEUKOCYTESUR NEGATIVE 09/03/2023 2046   Sepsis Labs: Invalid input(s): "PROCALCITONIN", "LACTICIDVEN"  Microbiology: No results found for this or any previous visit (from the past 240 hours).  Radiology Studies: CT RENAL STONE STUDY Result Date: 09/04/2023 CLINICAL DATA:  Symptomatic urolithiasis EXAM: CT ABDOMEN AND PELVIS WITHOUT CONTRAST TECHNIQUE: Multidetector CT imaging of the abdomen and pelvis was performed following the standard protocol without IV contrast. RADIATION DOSE REDUCTION:  This exam was performed according to the departmental dose-optimization program which includes automated exposure control, adjustment of the mA and/or kV according to patient size and/or use of iterative reconstruction technique. COMPARISON:  08/29/2023 FINDINGS: Lower chest: No acute pleural or parenchymal lung disease. Hepatobiliary: Unremarkable unenhanced appearance of the liver and gallbladder. Pancreas: Unremarkable unenhanced appearance. Spleen: Unremarkable unenhanced appearance. Adrenals/Urinary Tract: There has been distal migration of the obstructing 5 mm ureteral calculus seen previously, now located at the right UVJ reference image 71/2. There is increased right-sided hydronephrosis and hydroureter, with continued right renal parenchymal edema. The left kidney is unremarkable. The adrenals are stable. Bladder is minimally distended  without focal abnormality. The 2 mm bladder calculus seen previously appears to have passed in the interim. Stomach/Bowel: No bowel obstruction or ileus. Normal appendix right lower quadrant. No bowel wall thickening or inflammatory change. Vascular/Lymphatic: Multiple subcentimeter mesenteric lymph nodes are again identified, nonspecific. No pathologically enlarged lymph nodes. No significant vascular findings on this unenhanced exam. Reproductive: Prostate is unremarkable. Other: No free fluid or free intraperitoneal gas. No abdominal wall hernia. Musculoskeletal: No acute or destructive bony abnormalities. Reconstructed images demonstrate no additional findings. IMPRESSION: 1. Distal migration of the obstructing right ureteral calculus seen previously, now located at the right UVJ. Progressive right-sided hydronephrosis and hydroureter, with continued right renal edema, consistent with high-grade obstructive uropathy. 2. Interval passage of the 2 mm bladder calculus seen previously. 3. Stable borderline enlarged mesenteric lymph nodes, nonspecific but likely reactive. Electronically Signed   By: Sharlet Salina M.D.   On: 09/04/2023 15:19      Ronnita Paz T. Mersades [facility]  If 7PM-7AM, please contact night-coverage [email or web address] 09/05/2023, 11:23 AM

## 2023-09-05 NOTE — Plan of Care (Signed)
?  Problem: Education: ?Goal: Knowledge of General Education information will improve ?Description: Including pain rating scale, medication(s)/side effects and non-pharmacologic comfort measures ?Outcome: Progressing ?  ?Problem: Health Behavior/Discharge Planning: ?Goal: Ability to manage health-related needs will improve ?Outcome: Progressing ?  ?Problem: Activity: ?Goal: Risk for activity intolerance will decrease ?Outcome: Progressing ?  ?Problem: Nutrition: ?Goal: Adequate nutrition will be maintained ?Outcome: Progressing ?  ?Problem: Skin Integrity: ?Goal: Risk for impaired skin integrity will decrease ?Outcome: Progressing ?  ?

## 2023-09-06 ENCOUNTER — Inpatient Hospital Stay (HOSPITAL_COMMUNITY): Payer: Medicaid Other

## 2023-09-06 DIAGNOSIS — E876 Hypokalemia: Secondary | ICD-10-CM | POA: Diagnosis not present

## 2023-09-06 DIAGNOSIS — N201 Calculus of ureter: Secondary | ICD-10-CM | POA: Diagnosis not present

## 2023-09-06 DIAGNOSIS — N200 Calculus of kidney: Secondary | ICD-10-CM | POA: Diagnosis not present

## 2023-09-06 DIAGNOSIS — N1330 Unspecified hydronephrosis: Secondary | ICD-10-CM | POA: Diagnosis not present

## 2023-09-06 LAB — URINE CULTURE: Culture: NO GROWTH

## 2023-09-06 LAB — CBC
HCT: 40.1 % (ref 39.0–52.0)
Hemoglobin: 13.2 g/dL (ref 13.0–17.0)
MCH: 30.3 pg (ref 26.0–34.0)
MCHC: 32.9 g/dL (ref 30.0–36.0)
MCV: 92.2 fL (ref 80.0–100.0)
Platelets: 633 10*3/uL — ABNORMAL HIGH (ref 150–400)
RBC: 4.35 MIL/uL (ref 4.22–5.81)
RDW: 13.9 % (ref 11.5–15.5)
WBC: 9.8 10*3/uL (ref 4.0–10.5)
nRBC: 0 % (ref 0.0–0.2)

## 2023-09-06 LAB — RENAL FUNCTION PANEL
Albumin: 3.1 g/dL — ABNORMAL LOW (ref 3.5–5.0)
Anion gap: 7 (ref 5–15)
BUN: 13 mg/dL (ref 6–20)
CO2: 24 mmol/L (ref 22–32)
Calcium: 8.3 mg/dL — ABNORMAL LOW (ref 8.9–10.3)
Chloride: 100 mmol/L (ref 98–111)
Creatinine, Ser: 0.75 mg/dL (ref 0.61–1.24)
GFR, Estimated: >60 mL/min (ref >60)
Glucose, Bld: 95 mg/dL (ref 70–99)
Phosphorus: 1.9 mg/dL — ABNORMAL LOW (ref 2.5–4.6)
Potassium: 3.4 mmol/L — ABNORMAL LOW (ref 3.5–5.1)
Sodium: 131 mmol/L — ABNORMAL LOW (ref 135–145)

## 2023-09-06 LAB — MAGNESIUM: Magnesium: 1.5 mg/dL — ABNORMAL LOW (ref 1.7–2.4)

## 2023-09-06 MED ORDER — POTASSIUM CHLORIDE CRYS ER 20 MEQ PO TBCR
60.0000 meq | EXTENDED_RELEASE_TABLET | Freq: Once | ORAL | Status: AC
  Administered 2023-09-06: 60 meq via ORAL
  Filled 2023-09-06: qty 3

## 2023-09-06 MED ORDER — MAGNESIUM SULFATE 2 GM/50ML IV SOLN
2.0000 g | Freq: Once | INTRAVENOUS | Status: AC
  Administered 2023-09-06: 2 g via INTRAVENOUS
  Filled 2023-09-06: qty 50

## 2023-09-06 MED ORDER — POTASSIUM CHLORIDE 10 MEQ/100ML IV SOLN
10.0000 meq | INTRAVENOUS | Status: DC
  Filled 2023-09-06: qty 100

## 2023-09-06 MED ORDER — TAMSULOSIN HCL 0.4 MG PO CAPS: 0.4000 mg | ORAL_CAPSULE | Freq: Every day | ORAL | 0 refills | Status: AC

## 2023-09-06 NOTE — Progress Notes (Addendum)
  Subjective: Right flank pain has resolved. No nausea or vomiting. Doing well.   [place] completed showing improved hydro compared to CT.    Objective: Vital signs in last 24 hours: Temp:  [97.7 F (36.5 C)-98.6 F (37 C)] 98.6 F (37 C) (12/30 0517) Pulse Rate:  [57-85] 57 (12/30 0517) Resp:  [14-16] 16 (12/30 0517) BP: (114-119)/(73-79) 116/79 (12/30 0517) SpO2:  [98 %-99 %] 98 % (12/30 0517)  Intake/Output from previous day: 12/29 0701 - 12/30 0700 In: 2708.4 [P.O.:480; I.V.:2128.4; IV Piggyback:100] Out: 3350 [Urine:3350] Intake/Output this shift: No intake/output data recorded.  Physical Exam:  General: Alert and oriented. CV: RRR Lungs: Clear bilaterally. GI: Soft, Nondistended. GU: no right flank pain on exam today    Lab Results: Recent Labs    09/04/23 0513 09/05/23 0423 09/06/23 0401  HGB 12.9* 13.6 13.2  HCT 36.7* 38.8* 40.1   Cr: 0.75  Assessment/Plan: 27 year old male with right distal ureteral stone. [place] today shows improvement in R hydro, patient no longer has R flank pain. Patient has likely passed stone. Patient can be discharged no surgery necessary.  Recommendations: - no urologic intervention indicated - ok to DC  - pt may eat - recommend discussion with neurologist about changing anti seizure medications as this may be contributing to stone disease.  - urology will set up f/u in 4 months.  - please discharge on tamsulosin for the next 10 days  Vilma Prader MD.

## 2023-09-06 NOTE — Plan of Care (Signed)

## 2023-09-06 NOTE — Progress Notes (Signed)
   09/06/23 1106  TOC Brief Assessment  Insurance and Status Reviewed  Patient has primary care physician No  Home environment has been reviewed home with family  Prior level of function: independent  Prior/Current Home Services No current home services  Social Drivers of Health Review SDOH reviewed no interventions necessary  Readmission risk has been reviewed Yes   Met with pt to review PCP needs.  Pt reports that his father is hoping to secure PCP coverage with "Dr. Ethelene Hal".  I provided him with list of [facility] to use if father is unable to secure.  No further TOC needs.

## 2023-09-06 NOTE — Discharge Summary (Signed)
Physician Discharge Summary  EDO GRZYB ZOX:[id number] DOB: 1995/10/24 DOA: 09/03/2023  PCP: Oneita Hurt, No  Admit date: 09/03/2023 Discharge date: 09/06/2023 Admitted From: Home Disposition: Home Recommendations for Outpatient Follow-up:  Follow up with PCP in 1 to 2 weeks Check CMP and CBC at follow-up Urology to arrange outpatient follow-up Urology recommended to follow-up with neurology to review seizure medication which could potentially increase nephrolithiasis Please follow up on the following pending results: None  Home Health: Not indicated Equipment/Devices: Not indicated  Discharge Condition: Stable CODE STATUS: Full code   Hospital course 27 year old M with PMH of intellectual delay, epilepsy and nephrolithiasis presenting with increased right flank pain and admitted with hypokalemia and hyponatremia.  Patient was seen in ED on 12/22 with right flank pain, nausea and vomiting.  CT renal stone study showed 6 mm obstructing right ureteral stone with mild right hydronephrosis and 2 mm calculus in the urinary bladder.  He was discharged home on Percocet, Flomax and Zofran.    In ED, WBC 13.1.  Platelets 728.  NA 129.  K2.6. Cr 0.84.  UA with moderate Hgb.  Patient was started on IV fluid and potassium supplementation and admitted for overnight observation.   The next day, CT renal stone study with distal migration of obstructing right ureteral calculus now at right UVJ and progressive right-sided hydronephrosis and hydroureter with continued right renal edema consistent with high-grade obstructive uropathy.  Urology consulted and recommended IV fluid hydration and continue Flomax to allow passage spontaneously.  Renal ultrasound on the date of discharge showed interval resolution of right hydronephrosis.  Patient remained asymptomatic.  He was cleared for discharge by urology on p.o. Flomax for 10 more days.    See individual problem list below for more.   Problems addressed  during this hospitalization Obstructive right ureterolithiasis: Patient returns with right flank pain after seen in ED for the same on 12/22.  Unclear if he was taking his Flomax.  Repeat CT renal stone study as above.  UA with moderate Hgb but no leukocytes, nitrites or bacteria.  Had right CVA tenderness on 12/28.  Currently pain-free.  Renal ultrasound with interval resolution of right hydronephrosis.  -Cleared for discharge by urology on p.o. Flomax for 10 more days -Encouraged to maintain good hydration.   Hyponatremia: Likely hypovolemic.  Stable -Recheck BMP in 1 week   Hypokalemia/hypomagnesemia/hypophosphatemia: Likely due to IV fluid.  Replenished prior to discharge.   History of seizure disorder -Continue home Lamictal and Zonegran -Outpatient follow-up with neurology to discuss seizure medication and salicylates risk for nephrolithiasis   Intellectual disability: Noted  Thrombocytosis: Likely reactive. -Recheck CBC in 1 week              Time spent 35 minutes  Vital signs Vitals:   09/05/23 0529 09/05/23 1306 09/05/23 2220 09/06/23 0517  BP: 118/85 119/75 114/73 116/79  Pulse: 71 85 80 (!) 57  Temp: (!) 97.4 F (36.3 C) 97.7 F (36.5 C) 98 F (36.7 C) 98.6 F (37 C)  Resp: 16 14 16 16   Height:      Weight:      SpO2: 97% 99% 98% 98%  TempSrc: Oral Oral Oral   BMI (Calculated):         Discharge exam  GENERAL: No apparent distress.  Nontoxic. HEENT: MMM.  Poor dentition vision and hearing grossly intact.  NECK: Supple.  No apparent JVD.  RESP:  No IWOB.  Fair aeration bilaterally. CVS:  RRR. Heart sounds normal.  ABD/GI/GU: BS+. Abd soft, NTND.  MSK/EXT:  Moves extremities. No apparent deformity. No edema.  SKIN: no apparent skin lesion or wound NEURO: Awake and alert. Oriented appropriately.  No apparent focal neuro deficit. PSYCH: Calm. Normal affect.   Discharge Instructions Discharge Instructions     Diet general   Complete by: As  directed    Discharge instructions   Complete by: As directed    It has been a pleasure taking care of you!  You were hospitalized due to right kidney stone that seems to have passed.  Maintain good hydration.  Continue taking your tamsulosin.  Follow-up with your urologist to discuss your seizure medications which might increase risk of kidney stone.  Follow-up with your primary care doctor in 1 to 2 weeks or sooner if needed.   Take care,   Increase activity slowly   Complete by: As directed       Allergies as of 09/21/23       Reactions   Concerta [methylphenidate] Other (See Comments)   Would start crying for little things like when someone coughed or said a specific word for example.         Medication List     STOP taking these medications    aspirin EC 325 MG tablet       TAKE these medications    acetaminophen 325 MG tablet Commonly known as: TYLENOL Take 2 tablets (650 mg total) by mouth every 4 (four) hours as needed for mild pain (or temp >/= 99.5).   dicyclomine 10 MG capsule Commonly known as: BENTYL Take 1 capsule (10 mg total) by mouth every 8 (eight) hours as needed for spasms.   lamoTRIgine 200 MG tablet Commonly known as: LAMICTAL TAKE 1 TABLET BY MOUTH IN THE MORNING AND 2 AT NIGHT   ondansetron 4 MG disintegrating tablet Commonly known as: ZOFRAN-ODT Take 1 tablet (4 mg total) by mouth every 8 (eight) hours as needed for nausea or vomiting.   oxyCODONE-acetaminophen 5-325 MG tablet Commonly known as: PERCOCET/ROXICET Take 1 tablet by mouth every 6 (six) hours as needed for severe pain (pain score 7-10).   tamsulosin 0.4 MG Caps capsule Commonly known as: FLOMAX Take 1 capsule (0.4 mg total) by mouth daily for 10 days.   Vitamin D (Ergocalciferol) 1.25 MG (50000 UNIT) Caps capsule Commonly known as: DRISDOL Take 50,000 Units by mouth once a week.   zonisamide 100 MG capsule Commonly known as: ZONEGRAN Take 3 capsules at bedtime         Consultations: Urology  Procedures/Studies:   US RENAL Result Date: 21-Sep-2023 CLINICAL DATA:  Right ureteral calculus EXAM: RENAL / URINARY TRACT ULTRASOUND COMPLETE COMPARISON:  Renal stone protocol CT 09/04/2023 FINDINGS: Right Kidney: Renal measurements: 12.8 cm = volume: 171 mL. Echogenicity within normal limits. No mass or hydronephrosis visualized. Left Kidney: Renal measurements: 12.1 cm = volume: 142 mL. Echogenicity within normal limits. No mass or hydronephrosis visualized. Bladder: Appears normal for degree of bladder distention. Other: None. IMPRESSION: Interval resolution of right hydronephrosis. No significant sonographic abnormality of the kidneys or bladder. Electronically Signed   By: Acquanetta Belling M.D.   On: 09/21/23 06:48   CT RENAL STONE STUDY Result Date: 09/04/2023 CLINICAL DATA:  Symptomatic urolithiasis EXAM: CT ABDOMEN AND PELVIS WITHOUT CONTRAST TECHNIQUE: Multidetector CT imaging of the abdomen and pelvis was performed following the standard protocol without IV contrast. RADIATION DOSE REDUCTION: This exam was performed according to the departmental dose-optimization program which includes automated exposure  control, adjustment of the mA and/or kV according to patient size and/or use of iterative reconstruction technique. COMPARISON:  08/29/2023 FINDINGS: Lower chest: No acute pleural or parenchymal lung disease. Hepatobiliary: Unremarkable unenhanced appearance of the liver and gallbladder. Pancreas: Unremarkable unenhanced appearance. Spleen: Unremarkable unenhanced appearance. Adrenals/Urinary Tract: There has been distal migration of the obstructing 5 mm ureteral calculus seen previously, now located at the right UVJ reference image 71/2. There is increased right-sided hydronephrosis and hydroureter, with continued right renal parenchymal edema. The left kidney is unremarkable. The adrenals are stable. Bladder is minimally distended without focal abnormality.  The 2 mm bladder calculus seen previously appears to have passed in the interim. Stomach/Bowel: No bowel obstruction or ileus. Normal appendix right lower quadrant. No bowel wall thickening or inflammatory change. Vascular/Lymphatic: Multiple subcentimeter mesenteric lymph nodes are again identified, nonspecific. No pathologically enlarged lymph nodes. No significant vascular findings on this unenhanced exam. Reproductive: Prostate is unremarkable. Other: No free fluid or free intraperitoneal gas. No abdominal wall hernia. Musculoskeletal: No acute or destructive bony abnormalities. Reconstructed images demonstrate no additional findings. IMPRESSION: 1. Distal migration of the obstructing right ureteral calculus seen previously, now located at the right UVJ. Progressive right-sided hydronephrosis and hydroureter, with continued right renal edema, consistent with high-grade obstructive uropathy. 2. Interval passage of the 2 mm bladder calculus seen previously. 3. Stable borderline enlarged mesenteric lymph nodes, nonspecific but likely reactive. Electronically Signed   By: Sharlet Salina M.D.   On: 09/04/2023 15:19   CT Renal Stone Study Result Date: 08/29/2023 CLINICAL DATA:  Right flank pain, nausea, vomiting. EXAM: CT ABDOMEN AND PELVIS WITHOUT CONTRAST TECHNIQUE: Multidetector CT imaging of the abdomen and pelvis was performed following the standard protocol without IV contrast. RADIATION DOSE REDUCTION: This exam was performed according to the departmental dose-optimization program which includes automated exposure control, adjustment of the mA and/or kV according to patient size and/or use of iterative reconstruction technique. COMPARISON:  CT abdomen and pelvis dated 12/15/2019. FINDINGS: Lower chest: No acute abnormality. Hepatobiliary: No focal liver abnormality is seen. No gallstones, gallbladder wall thickening, or biliary dilatation. Pancreas: Unremarkable. No pancreatic ductal dilatation or  surrounding inflammatory changes. Spleen: Normal in size without focal abnormality. Adrenals/Urinary Tract: Adrenal glands are unremarkable. A 6 mm calculus is seen in the midportion of the right ureter and there is mild right hydronephrosis. There is a 2 mm nonobstructing left renal calculus. No left hydronephrosis. A 2 mm calculus appears to be located within the urinary bladder lumen. The urinary bladder is incompletely distended. Stomach/Bowel: Stomach is within normal limits. Appendix appears normal. No evidence of bowel wall thickening, distention, or inflammatory changes. Vascular/Lymphatic: No significant vascular findings are present. Multiple mildly enlarged mesenteric lymph nodes are seen in the right hemiabdomen. Reproductive: Prostate is unremarkable. Other: No abdominal wall hernia or abnormality. No abdominopelvic ascites. Musculoskeletal: No acute or significant osseous findings. IMPRESSION: 1. Obstructing 6 mm calculus in the midportion of the right ureter with mild right hydronephrosis. 2. A 2 mm calculus appears to be located within the urinary bladder lumen and may reflect a recently passed stone. 3. Multiple mildly enlarged mesenteric lymph nodes in the right hemiabdomen are nonspecific but can be seen in the setting of mesenteric adenitis. Electronically Signed   By: Romona Curls M.D.   On: 08/29/2023 12:10       The results of significant diagnostics from this hospitalization (including imaging, microbiology, ancillary and laboratory) are listed below for reference.     Microbiology: Recent Results (  from the past 240 hours)  Urine Culture (for pregnant, neutropenic or urologic patients or patients with an indwelling urinary catheter)     Status: None   Collection Time: 09/03/23  8:46 PM   Specimen: Urine, Clean Catch  Result Value Ref Range Status   Specimen Description   Final    URINE, CLEAN CATCH Performed at [facility], [address],  [place], [place] [address]    Special Requests   Final    NONE Performed at [facility], [address] [place], [place] [address]    Culture   Final    NO GROWTH Performed at [facility], [address], [place], [place] [address]    Report Status 09/06/2023 FINAL  Final     Labs:  CBC: Recent Labs  Lab 09/03/23 2038 09/04/23 0513 09/05/23 0423 09/06/23 0401  WBC 13.1* 10.4 12.7* 9.8  HGB 14.7 12.9* 13.6 13.2  HCT 42.2 36.7* 38.8* 40.1  MCV 89.4 90.2 90.9 92.2  PLT 728* 574* 604* 633*   BMP &GFR Recent Labs  Lab 09/03/23 2038 09/04/23 0513 09/04/23 1522 09/05/23 0423 09/06/23 0401  NA 129* 133* 133* 132* 131*  K 2.6* 2.9* 3.7 3.0* 3.4*  CL 92* 101 100 99 100  CO2 26 24 25 22 24   GLUCOSE 108* 100* 109* 94 95  BUN 12 12 11 16 13   CREATININE 0.84 0.95 0.90 0.78 0.75  CALCIUM 8.8* 8.4* 8.5* 8.6* 8.3*  MG 2.2 2.0  --  2.0 1.5*  PHOS  --   --  1.4* 2.7 1.9*   Estimated Creatinine Clearance: 112 mL/min (by C-G formula based on SCr of 0.75 mg/dL). Liver & Pancreas: Recent Labs  Lab 09/04/23 1522 09/05/23 0423 09/06/23 0401  ALBUMIN 3.2* 3.2* 3.1*   No results for input(s): "LIPASE", "AMYLASE" in the last 168 hours. No results for input(s): "AMMONIA" in the last 168 hours. Diabetic: No results for input(s): "HGBA1C" in the last 72 hours. No results for input(s): "GLUCAP" in the last 168 hours. Cardiac Enzymes: No results for input(s): "CKTOTAL", "CKMB", "CKMBINDEX", "TROPONINI" in the last 168 hours. No results for input(s): "PROBNP" in the last 8760 hours. Coagulation Profile: No results for input(s): "INR", "PROTIME" in the last 168 hours. Thyroid Function Tests: No results for input(s): "TSH", "T4TOTAL", "FREET4", "T3FREE", "THYROIDAB" in the last 72 hours. Lipid Profile: No results for input(s): "CHOL", "HDL", "LDLCALC", "TRIG", "CHOLHDL", "LDLDIRECT" in the last 72 hours. Anemia Panel: No results for input(s): "VITAMINB12",  "FOLATE", "FERRITIN", "TIBC", "IRON", "RETICCTPCT" in the last 72 hours. Urine analysis:    Component Value Date/Time   COLORURINE STRAW (A) 09/03/2023 2046   APPEARANCEUR CLEAR 09/03/2023 2046   LABSPEC 1.003 (L) 09/03/2023 2046   PHURINE 7.0 09/03/2023 2046   GLUCOSEU NEGATIVE 09/03/2023 2046   HGBUR MODERATE (A) 09/03/2023 2046   BILIRUBINUR NEGATIVE 09/03/2023 2046   KETONESUR 5 (A) 09/03/2023 2046   PROTEINUR NEGATIVE 09/03/2023 2046   UROBILINOGEN 0.2 06/30/2015 1900   NITRITE NEGATIVE 09/03/2023 2046   LEUKOCYTESUR NEGATIVE 09/03/2023 2046   Sepsis Labs: Invalid input(s): "PROCALCITONIN", "LACTICIDVEN"   SIGNED:  Almon Hercules, MD  [facility] 09/06/2023, 5:25 PM

## 2023-09-07 ENCOUNTER — Telehealth (INDEPENDENT_AMBULATORY_CARE_PROVIDER_SITE_OTHER): Payer: Self-pay | Admitting: Family

## 2023-09-07 NOTE — Telephone Encounter (Signed)
 Dad called to inform me that Ridley was recently admitted to the hospital for kidney stones. He said that he was told that one of his seizure medicines can contribute to stone formation. I talked with Dad and told him that Zonisamide  can cause stones and that is why Caroll has been instructed to be very well hydrated. It has been difficult to find a medication for his seizures so I am reluctant to change the medication now. I recommended that Kazmir work on adequate hydration and will schedule him for a visit to discuss further. TG

## 2023-10-05 ENCOUNTER — Ambulatory Visit (INDEPENDENT_AMBULATORY_CARE_PROVIDER_SITE_OTHER): Payer: Medicaid Other | Admitting: Family

## 2023-10-05 ENCOUNTER — Encounter (INDEPENDENT_AMBULATORY_CARE_PROVIDER_SITE_OTHER): Payer: Self-pay | Admitting: Family

## 2023-10-05 VITALS — BP 110/70 | HR 96 | Ht 68.0 in | Wt 128.0 lb

## 2023-10-05 DIAGNOSIS — J019 Acute sinusitis, unspecified: Secondary | ICD-10-CM

## 2023-10-05 DIAGNOSIS — G40309 Generalized idiopathic epilepsy and epileptic syndromes, not intractable, without status epilepticus: Secondary | ICD-10-CM

## 2023-10-05 DIAGNOSIS — R051 Acute cough: Secondary | ICD-10-CM

## 2023-10-05 DIAGNOSIS — F411 Generalized anxiety disorder: Secondary | ICD-10-CM

## 2023-10-05 DIAGNOSIS — G40109 Localization-related (focal) (partial) symptomatic epilepsy and epileptic syndromes with simple partial seizures, not intractable, without status epilepticus: Secondary | ICD-10-CM

## 2023-10-05 DIAGNOSIS — Z72820 Sleep deprivation: Secondary | ICD-10-CM

## 2023-10-05 DIAGNOSIS — Z5941 Food insecurity: Secondary | ICD-10-CM

## 2023-10-05 DIAGNOSIS — F819 Developmental disorder of scholastic skills, unspecified: Secondary | ICD-10-CM

## 2023-10-05 MED ORDER — ZONISAMIDE 100 MG PO CAPS: ORAL_CAPSULE | ORAL | 5 refills | Status: DC

## 2023-10-05 MED ORDER — AZITHROMYCIN 250 MG PO TABS: ORAL_TABLET | ORAL | 0 refills | Status: DC

## 2023-10-05 MED ORDER — BENZONATATE 100 MG PO CAPS: ORAL_CAPSULE | ORAL | 0 refills | Status: DC

## 2023-10-05 MED ORDER — LAMOTRIGINE 200 MG PO TABS: ORAL_TABLET | ORAL | 5 refills | Status: DC

## 2023-10-05 NOTE — Patient Instructions (Addendum)
It was a pleasure to see you today!  Instructions for you until your next appointment are as follows: Start Azithromycin 250mg  - take 2 tablets today, than take 1 tablet twice per day for 4 days I also sent in a prescription for a cough medication. Take 1 capsule every 8 hours as needed for cough. Do not take for more than 5 days.  If you are still sick after 5 days, you would be seen at Urgent Care for follow up.  Take your seizure medications as prescribe Remember that it is important for you to drink at least 48-60 oz of water each day to help avoid kidney stones Let me know if you have seizures Please sign up for MyChart if you have not done so. Please plan to return for follow up in 3 months or sooner if needed.  Feel free to contact our office during normal business hours at [phone] with questions or concerns. If there is no answer or the call is outside business hours, please leave a message and our clinic staff will call you back within the next business day.  If you have an urgent concern, please stay on the line for our after-hours answering service and ask for the on-call neurologist.     I also encourage you to use MyChart to communicate with me more directly. If you have not yet signed up for MyChart within [facility], the front desk staff can help you. However, please note that this inbox is NOT monitored on nights or weekends, and response can take up to 2 business days.  Urgent matters should be discussed with the on-call pediatric neurologist.   At [facility], we are committed to providing exceptional care. You will receive a patient satisfaction survey through text or email regarding your visit today. Your opinion is important to me. Comments are appreciated.

## 2023-10-05 NOTE — Progress Notes (Unsigned)
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 He is taking and tolerating Lamictal and Zonisamide for his seizure disorder. There have been problems in the past with lack of insurance affecting medication options as well as problems with his memory and intellect, despite use of written instructions. He has been treated with Vitamin D3 for Vitamin D deficiency. He has had problems with weight loss related to food insecurity.   Today's concerns: He reports that he has been seizure free since his last visit.  He was admitted to the hospital in December for renal stones. He says that he was told that he had them because of inadequate fluid intake, and has tried to drink more water since the admission He reports a frequent cough and feeling poorly for the past 4 days. He has not had fever. He reports some sore throat. He has had trouble sleeping due to coughing.  Franklin [name] has been otherwise generally healthy since he was last seen. No health concerns today other than previously mentioned.  Review of systems: Please see HPI for neurologic and other pertinent review of systems. Otherwise all other systems were reviewed and were negative.  Problem List: Patient Active Problem List   Diagnosis Date Noted   Ureterolithiasis 09/05/2023   Right ureteral stone 09/05/2023   Hyponatremia 09/03/2023   Hydronephrosis of right kidney 09/03/2023   Nephrolithiasis 09/03/2023   Food insecurity 08/18/2023   Vitamin D deficiency 01/29/2023   Elevated alkaline phosphatase level 08/17/2022   Focal epilepsy (HCC)  08/17/2022   Body mass index (BMI) of 20.0-20.9 in adult 08/17/2022   Does not have health insurance 10/24/2021   Adjustment disorder with mixed anxiety and depressed mood 09/10/2021   Poor sleep 03/01/2021   Generalized anxiety disorder 04/10/2020   Loss of weight 04/10/2020   Seizure (HCC) 05/13/2019   Hypokalemia 05/12/2019   Hypomagnesemia 05/12/2019   Hypocalcemia 05/12/2019   Intellectual delay 03/10/2018   Personal history of prematurity 03/10/2018   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 He was tried on Concerta which caused significant emotional disturbance, then on Ritalin, then Adderall which worked well. He has mild hearing loss. 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   Birth History: He was born at [redacted] weeks gestation, weighing 1 lb 13 1/2 oz. He required ventilator support for sometime. He reportedly had no CNS hemorrhage. Growth and development was delayed.  Surgical history: Past Surgical History:  Procedure Laterality Date   Central line placement     As premature infant   REFRACTIVE SURGERY  For retinopathy of prematurity     Family history: family history includes Autism in his brother; Diabetes type II in his father; Hypercholesterolemia in his father; Hypertension in his father; Stroke in his paternal aunt and paternal grandmother.   Social history: Social History   Socioeconomic  History   Marital status: Single    Spouse name: Not on file   Number of children: Not on file   Years of education: Not on file   Highest education level: Not on file  Occupational History   Not on file  Tobacco Use   Smoking status: Never    Passive exposure: Current (Cousin smokes in home)   Smokeless tobacco: Never  Vaping Use   Vaping status: Some Days   Substances: Flavoring  Substance and Sexual Activity   Alcohol use: Never   Drug use: Never   Sexual activity: Never  Other Topics Concern   Not on file  Social History Narrative   Parents are divorced. Frankie lives with his father.   Social Drivers of Corporate investment banker Strain: Low Risk  (05/12/2019)   Overall Financial Resource Strain (CARDIA)    Difficulty of Paying Living Expenses: Not hard at all  Food Insecurity: No Food Insecurity (09/04/2023)   Hunger Vital Sign    Worried About Running Out of Food in the Last Year: Never true    Ran Out of Food in the Last Year: Never true  Transportation Needs: No Transportation Needs (09/04/2023)   PRAPARE - Administrator, Civil Service (Medical): No    Lack of Transportation (Non-Medical): No  Physical Activity: Sufficiently Active (05/12/2019)   Exercise Vital Sign    Days of Exercise per Week: 7 days    Minutes of Exercise per Session: 30 min  Stress: Stress Concern Present (05/12/2019)   [name] of Occupational Health - Occupational Stress Questionnaire    Feeling of Stress : To some extent  Social Connections: Socially Isolated (05/12/2019)   Social Connection and Isolation Panel [NHANES]    Frequency of Communication with Friends and Family: Once a week    Frequency of Social Gatherings with Friends and Family: Once a week    Attends Religious Services: Never    Database administrator or Organizations: No    Attends Banker Meetings: Never    Marital Status: Never married  Intimate Partner Violence: Not At Risk (09/04/2023)    Humiliation, Afraid, Rape, and Kick questionnaire    Fear of Current or Ex-Partner: No    Emotionally Abused: No    Physically Abused: No    Sexually Abused: No   Past/failed meds: Copied from previous record: Levetiracetam - did not control seizures Fycompa - did not control seizures and made him dizzy Lamotrigine IR - dizziness Oxtellar XR - cost Briviact - had difficulty getting it from the pharmacy  Allergies: Allergies  Allergen Reactions   Concerta [Methylphenidate] Other (See Comments)    Would start crying for little things like when someone coughed or said a specific word for example.     Immunizations: Immunization History  Administered Date(s) Administered   PFIZER(Purple Top)SARS-COV-2 Vaccination 12/08/2019, 01/01/2020   Tdap 06/30/2015, 09/14/2018, 02/18/2022    Diagnostics/Screenings: Copied from previous record: 09/14/2022 MRI Brain wo contrast - 1. No acute brain finding. 2. 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 3. Question mild volume loss and increased FLAIR  signal within the hippocampus on the left compared to the right. I am not sure this is definite, but could indicate subtle mesial temporal sclerosis on the left. Relative asymmetric prominence of the occipital horn of the left lateral ventricle but without evidence of definable focal abnormality of the left occipital lobe.   07/03/2022 - rEEG - This is a abnormal record with the patient in awake and drowsy states due to generalized slowing.  No evidence of decreased seizure threshold, however does not rule out epilepsy.  Clinical correlation advised. Lorenz Coaster MD MPH  Physical Exam: BP 110/70 (BP Location: Left Arm, Patient Position: Sitting, Cuff Size: Normal)   Pulse 96   Ht 5\' 8"  (1.727 m)   Wt 128 lb (58.1 kg)   BMI 19.46 kg/m   Wt Readings from Last 3 Encounters:  10/05/23 128 lb (58.1 kg)   09/03/23 128 lb (58.1 kg)  08/29/23 128 lb (58.1 kg)   General: Thin but well developed, well nourished man, seated on exam table, in no evident distress Head: Head normocephalic and atraumatic.  Oropharynx with thick tan drainage in the posterior pharynx  Neck: Supple Cardiovascular: Regular rate and rhythm, no murmurs Respiratory: Breath sounds clear to auscultation. Has frequent non-productive cough Musculoskeletal: No obvious deformities or scoliosis Skin: No rashes or neurocutaneous lesions  Neurologic Exam Mental Status: Awake and fully alert.  Oriented to place and time.  Recent and remote memory intact.  Attention span, concentration, and fund of knowledge subnormal for age.  Mood and affect appropriate. Cranial Nerves: Fundoscopic exam reveals sharp disc margins.  Pupils equal, briskly reactive to light.  Extraocular movements full without nystagmus. Hearing intact and symmetric to whisper.  Facial sensation intact.  Face tongue, palate move normally and symmetrically. Shoulder shrug normal Motor: Normal bulk and tone. Normal strength in all tested extremity muscles. Sensory: Intact to touch and temperature in all extremities.  Coordination: Rapid alternating movements normal in all extremities.  Finger-to-nose and heel-to shin performed accurately bilaterally.  Romberg negative. Gait and Station: Arises from chair without difficulty.  Stance is normal. Gait demonstrates normal stride length and balance.   Able to heel, toe and tandem walk without difficulty. Reflexes: 1+ and symmetric. Toes downgoing.   Impression: Generalized convulsive epilepsy (HCC) - Plan: lamoTRIgine (LAMICTAL) 200 MG tablet, zonisamide (ZONEGRAN) 100 MG capsule  Acute cough - Plan: azithromycin (ZITHROMAX) 250 MG tablet, benzonatate (TESSALON) 100 MG capsule  Acute sinusitis, recurrence not specified, unspecified location - Plan: azithromycin (ZITHROMAX) 250 MG tablet, benzonatate (TESSALON) 100 MG  capsule  Intellectual delay  Generalized anxiety disorder  Focal epilepsy (HCC)  Food insecurity  Poor sleep   Recommendations for plan of care: The patient's previous Epic records were reviewed. No recent diagnostic studies to be reviewed with the patient. I talked with Sankalp and his father about his cough and the posterior pharynx drainage. Because of his seizure disorder and that the frequent coughing is disrupting sleep, I recommended treatment with Zithromax and Tessalon. I gave him strict instructions to be seen in Urgent Care or ED if his symptoms worsen or if he does not improve after taking the Zithromax.  Plan until next visit: Zithromax prescribed for 5 day course for sinusitis Tessalon capsules prescribed for coughing Go to Urgent Care or ED if symptoms worsen or condition does not improve Continue seizure medications as prescribed  Report seizures if they occur Get established with a PCP Continue to work on drinking more water each day Call for questions  or concerns Return in about 3 months (around 01/03/2024).  The medication list was reviewed and reconciled. I reviewed the changes that were made in the prescribed medications today. A complete medication list was provided to the patient.   Allergies as of 10/05/2023       Reactions   Concerta [methylphenidate] Other (See Comments)   Would start crying for little things like when someone coughed or said a specific word for example.         Medication List        Accurate as of October 05, 2023 11:59 PM. If you have any questions, ask your nurse or doctor.          acetaminophen 325 MG tablet Commonly known as: TYLENOL Take 2 tablets (650 mg total) by mouth every 4 (four) hours as needed for mild pain (or temp >/= 99.5).   azithromycin 250 MG tablet Commonly known as: ZITHROMAX Take 2 tablets today, then take 1 tablet in the morning and at night for 4 days Started by: Elveria Rising   benzonatate  100 MG capsule Commonly known as: TESSALON Take 1 capsule every 8 hours as needed for cough. Started by: Elveria Rising   dicyclomine 10 MG capsule Commonly known as: BENTYL Take 1 capsule (10 mg total) by mouth every 8 (eight) hours as needed for spasms.   lamoTRIgine 200 MG tablet Commonly known as: LAMICTAL TAKE 1 TABLET BY MOUTH IN THE MORNING AND 2 AT NIGHT   ondansetron 4 MG disintegrating tablet Commonly known as: ZOFRAN-ODT Take 1 tablet (4 mg total) by mouth every 8 (eight) hours as needed for nausea or vomiting.   oxyCODONE-acetaminophen 5-325 MG tablet Commonly known as: PERCOCET/ROXICET Take 1 tablet by mouth every 6 (six) hours as needed for severe pain (pain score 7-10).   Vitamin D (Ergocalciferol) 1.25 MG (50000 UNIT) Caps capsule Commonly known as: DRISDOL Take 50,000 Units by mouth once a week.   zonisamide 100 MG capsule Commonly known as: ZONEGRAN Take 3 capsules at bedtime      Total time spent with the patient was 40 minutes, of which 50% or more was spent in counseling and coordination of care.  Elveria Rising NP-C [facility] [address], [address] [place], [place] [address] Ph. [phone] Fax [phone]

## 2023-10-06 ENCOUNTER — Encounter (INDEPENDENT_AMBULATORY_CARE_PROVIDER_SITE_OTHER): Payer: Self-pay | Admitting: Family

## 2023-11-18 ENCOUNTER — Ambulatory Visit (INDEPENDENT_AMBULATORY_CARE_PROVIDER_SITE_OTHER): Payer: Self-pay | Admitting: Family

## 2024-01-05 NOTE — Progress Notes (Signed)
 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 He is taking and tolerating Lamictal  and Zonisamide  for his seizure disorder. There have been problems in the past with lack of insurance affecting medication options as well as problems with his memory and intellect, despite use of written instructions. He has been treated with Vitamin D3 for Vitamin D  deficiency. He has had problems with weight loss related to food insecurity.   Today's concerns: He reports no seizures since his last visit. He says that he has been compliant with medication and staying on a sleep schedule.  Dad reports that Harlan has had some recent episodes of nocturnal urinary incontinence without obvious seizure events. Hudsyn believes that he may have been in deep sleep and dreaming. Dad has called his PCP to see if they will see Yug to evaluate him because Kota does not currently have a PCP.  Jariel is wearing a knee brace today and says that last week he dislocated his knee. He is being treated by [facility] for that.  Mikail has lost weight today. He is down 1 lb from his last visit but it is likely a greater weight loss than that because he did not remove the knee brace when he was weighed. He says that he has been eating but often does not eat much because of food insecurity. Trejon has been otherwise generally healthy since he was last seen. No health concerns today other than previously mentioned.  Review of systems: Please see HPI for neurologic and other pertinent review of systems. Otherwise all other systems  were reviewed and were negative.  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   Body mass index (BMI) of 20.0-20.9 in adult 08/17/2022   Does not have health insurance 10/24/2021   Adjustment disorder with mixed anxiety and depressed mood 09/10/2021   Poor sleep 03/01/2021   Generalized anxiety disorder 04/10/2020   Loss of weight 04/10/2020   Seizure (HCC) 05/13/2019   Hypokalemia 05/12/2019   Hypomagnesemia 05/12/2019   Hypocalcemia 05/12/2019   Intellectual delay 03/10/2018   Personal history of prematurity 03/10/2018   Generalized convulsive epilepsy (HCC) 06/14/2013   Long-term use of high-risk medication 06/14/2013     Past Medical History:  Diagnosis Date   Prematurity    Retinopathy of prematurity    Seizures (HCC)     Past medical history comments: See HPI Copied from previous record: When he was in school he was diagnosed with attention deficit disorder and central auditory processing deficit by Dr Larae Plaster. He was tried on Concerta which caused significant emotional disturbance, then on Ritalin, then Adderall which worked well. He has mild hearing loss. 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  He had laser therapy to both eyes as an infant for retinopathy of prematurity.   Birth History: He was born at [redacted] weeks gestation, weighing 1 lb 13 1/2 oz. He required ventilator support for sometime. He reportedly had no CNS hemorrhage. Growth and development was delayed.  Surgical history: Past Surgical History:   Procedure Laterality Date   Central line placement     As premature infant   REFRACTIVE SURGERY     For retinopathy of prematurity     Family history: family history includes Autism in his brother; Diabetes type II in his father; Hypercholesterolemia in his father; Hypertension in his father; Stroke in his paternal aunt and paternal grandmother.   Social history: Social History   Socioeconomic History   Marital status: Single    Spouse name: Not on file   Number of children: Not on file   Years of education: Not on file   Highest education level: Not on file  Occupational History   Not on file  Tobacco Use   Smoking status: Never    Passive exposure: Current (Cousin smokes in home)   Smokeless tobacco: Never  Vaping Use   Vaping status: Some Days   Substances: Flavoring  Substance and Sexual Activity   Alcohol use: Never   Drug use: Never   Sexual activity: Never  Other Topics Concern   Not on file  Social History Narrative   Parents are divorced. Toland lives with his father.   Social Drivers of Corporate investment banker Strain: Low Risk  (05/12/2019)   Overall Financial Resource Strain (CARDIA)    Difficulty of Paying Living Expenses: Not hard at all  Food Insecurity: No Food Insecurity (09/04/2023)   Hunger Vital Sign    Worried About Running Out of Food in the Last Year: Never true    Ran Out of Food in the Last Year: Never true  Transportation Needs: No Transportation Needs (09/04/2023)   PRAPARE - Administrator, Civil Service (Medical): No    Lack of Transportation (Non-Medical): No  Physical Activity: Sufficiently Active (05/12/2019)   Exercise Vital Sign    Days of Exercise per Week: 7 days    Minutes of Exercise per Session: 30 min  Stress: Stress Concern Present (05/12/2019)   [name] of Occupational Health - Occupational Stress Questionnaire    Feeling of Stress : To some extent  Social Connections: Socially Isolated (05/12/2019)    Social Connection and Isolation Panel [NHANES]    Frequency of Communication with Friends and Family: Once a week    Frequency of Social Gatherings with Friends and Family: Once a week    Attends Religious Services: Never    Database administrator or Organizations: No    Attends Banker Meetings: Never    Marital Status: Never married  Intimate Partner Violence: Not At Risk (09/04/2023)   Humiliation, Afraid, Rape, and Kick questionnaire    Fear of Current or Ex-Partner: No    Emotionally Abused: No    Physically Abused: No    Sexually Abused: No    Past/failed meds: Copied from previous record: Levetiracetam  - did not control seizures Fycompa  - did not control seizures and made him dizzy Lamotrigine  IR - dizziness Oxtellar XR  - cost Briviact  - had difficulty getting it from the pharmacy   Allergies: Allergies  Allergen Reactions   Concerta [Methylphenidate] Other (See Comments)    Would start crying for little things like when someone coughed or  said a specific word for example.     Immunizations: Immunization History  Administered Date(s) Administered   PFIZER(Purple Top)SARS-COV-2 Vaccination 12/08/2019, 01/01/2020   Tdap 06/30/2015, 09/14/2018, 02/18/2022    Diagnostics/Screenings: Copied from previous record: 09/14/2022 MRI Brain wo contrast - 1. No acute brain finding. 2. 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 3. Question mild volume loss and increased FLAIR signal within the hippocampus on the left compared to the right. I am not sure this is definite, but could indicate subtle mesial temporal sclerosis on the left. Relative asymmetric prominence of the occipital horn of the left lateral ventricle but without evidence of definable focal abnormality of the left occipital lobe.   07/03/2022 - rEEG - This is a abnormal record with the patient in awake and  drowsy states due to generalized slowing.  No evidence of decreased seizure threshold, however does not rule out epilepsy.  Clinical correlation advised. Marny Sires MD MPH  Physical Exam: BP 116/74   Pulse 66   Ht 5' 8.66" (1.744 m)   Wt 127 lb 6.8 oz (57.8 kg)   BMI 19.00 kg/m   Wt Readings from Last 3 Encounters:  01/06/24 127 lb 6.8 oz (57.8 kg)  10/05/23 128 lb (58.1 kg)  09/03/23 128 lb (58.1 kg)    General: Thin but well developed, well nourished man, seated on exam table, in no evident distress Head: Head normocephalic and atraumatic.  Oropharynx benign. Neck: Supple Cardiovascular: Regular rate and rhythm, no murmurs Respiratory: Breath sounds clear to auscultation Musculoskeletal: No obvious deformities or scoliosis. Wearing long brace on left knee. Skin: No rashes or neurocutaneous lesions  Neurologic Exam Mental Status: Awake and fully alert.  Oriented to place and time.  Recent and remote memory intact.  Attention span, concentration, and fund of knowledge subnormal for age.  Mood and affect appropriate. Cranial Nerves: Fundoscopic exam reveals sharp disc margins.  Pupils equal, briskly reactive to light.  Extraocular movements full without nystagmus. Hearing intact and symmetric to whisper.  Facial sensation intact.  Face tongue, palate move normally and symmetrically. Shoulder shrug normal Motor: Normal bulk and tone. Normal strength in all tested extremity muscles. I did not test the left leg. Sensory: Intact to touch and temperature in all extremities.  Coordination: No dysmetria with reach for objects Gait and Station: Arises from chair without difficulty.  Stance is normal. Gait antalgic today because of left knee injury.  Impression: Generalized convulsive epilepsy (HCC) - Plan: lamoTRIgine  (LAMICTAL ) 200 MG tablet, zonisamide  (ZONEGRAN ) 100 MG capsule  Intellectual delay  Focal epilepsy (HCC)  Food insecurity  Loss of weight  Nocturnal enuresis    Recommendations for plan of care: The patient's previous Epic records were reviewed. No recent diagnostic studies to be reviewed with the patient. I talked with Raybon and his father about his nocturnal enuresis and encouraged him to get established with a PCP. Dad has called his personal PCP to request an appointment for [facility].   I am also concerned about his weight loss. He likely has nutritional deficiencies because of low weight and food insecurity. I explained that this would also be best managed by a PCP.   I instructed Letrell to follow up with his orthopedist as scheduled for his knee.   Plan until next visit: Get established with a PCP as we discussed Be sure to follow up with [facility] as scheduled Continue medications as prescribed  Call if seizures occur or for questions or concerns Return in about 3 months (around 04/07/2024).  The medication list was reviewed and reconciled. No changes were made in the prescribed medications today. A complete medication list was provided to the patient.  Allergies as of 01/06/2024       Reactions   Concerta [methylphenidate] Other (See Comments)   Would start crying for little things like when someone coughed or said a specific word for example.         Medication List        Accurate as of Jan 06, 2024  3:25 PM. If you have any questions, ask your nurse or doctor.          STOP taking these medications    acetaminophen  325 MG tablet Commonly known as: TYLENOL  Stopped by: Lyndol Santee   azithromycin  250 MG tablet Commonly known as: ZITHROMAX  Stopped by: Lyndol Santee   benzonatate  100 MG capsule Commonly known as: TESSALON  Stopped by: Lyndol Santee   dicyclomine  10 MG capsule Commonly known as: BENTYL  Stopped by: Lyndol Santee   meloxicam 15 MG tablet Commonly known as: MOBIC Stopped by: Lyndol Santee   ondansetron  4 MG disintegrating tablet Commonly known as: ZOFRAN -ODT Stopped by: Lyndol Santee   oxyCODONE -acetaminophen  5-325 MG tablet Commonly known as: PERCOCET/ROXICET Stopped by: Lyndol Santee   Vitamin D  (Ergocalciferol ) 1.25 MG (50000 UNIT) Caps capsule Commonly known as: DRISDOL  Stopped by: Lyndol Santee       TAKE these medications    lamoTRIgine  200 MG tablet Commonly known as: LAMICTAL  TAKE 1 TABLET BY MOUTH IN THE MORNING AND 2 AT NIGHT   zonisamide  100 MG capsule Commonly known as: ZONEGRAN  Take 3 capsules at bedtime      Total time spent with the patient was 30 minutes, of which 50% or more was spent in counseling and coordination of care.  Lyndol Santee NP-C [facility] [address] [place], [place] [address] Ph. [phone] Fax [phone]

## 2024-01-06 ENCOUNTER — Encounter (INDEPENDENT_AMBULATORY_CARE_PROVIDER_SITE_OTHER): Payer: Self-pay | Admitting: Family

## 2024-01-06 ENCOUNTER — Ambulatory Visit (INDEPENDENT_AMBULATORY_CARE_PROVIDER_SITE_OTHER): Payer: Medicaid Other | Admitting: Family

## 2024-01-06 VITALS — BP 116/74 | HR 66 | Ht 68.66 in | Wt 127.4 lb

## 2024-01-06 DIAGNOSIS — N3944 Nocturnal enuresis: Secondary | ICD-10-CM

## 2024-01-06 DIAGNOSIS — Z5941 Food insecurity: Secondary | ICD-10-CM

## 2024-01-06 DIAGNOSIS — G40109 Localization-related (focal) (partial) symptomatic epilepsy and epileptic syndromes with simple partial seizures, not intractable, without status epilepticus: Secondary | ICD-10-CM

## 2024-01-06 DIAGNOSIS — F819 Developmental disorder of scholastic skills, unspecified: Secondary | ICD-10-CM | POA: Diagnosis not present

## 2024-01-06 DIAGNOSIS — G40309 Generalized idiopathic epilepsy and epileptic syndromes, not intractable, without status epilepticus: Secondary | ICD-10-CM | POA: Diagnosis not present

## 2024-01-06 DIAGNOSIS — R634 Abnormal weight loss: Secondary | ICD-10-CM | POA: Diagnosis not present

## 2024-01-06 MED ORDER — ZONISAMIDE 100 MG PO CAPS: ORAL_CAPSULE | ORAL | 5 refills | Status: DC

## 2024-01-06 MED ORDER — LAMOTRIGINE 200 MG PO TABS: ORAL_TABLET | ORAL | 5 refills | Status: DC

## 2024-01-06 NOTE — Patient Instructions (Signed)
 It was a pleasure to see you today!  Instructions for you until your next appointment are as follows: Be sure to call and get established with a PCP Be sure to follow up with [facility] as scheduled Continue taking your medications as prescribed Let me know if you have any seizures or if you have any other concerns Please sign up for MyChart if you have not done so. Please plan to return for follow up in 3 months or sooner if needed.  Feel free to contact our office during normal business hours at [phone] with questions or concerns. If there is no answer or the call is outside business hours, please leave a message and our clinic staff will call you back within the next business day.  If you have an urgent concern, please stay on the line for our after-hours answering service and ask for the on-call neurologist.     I also encourage you to use MyChart to communicate with me more directly. If you have not yet signed up for MyChart within [facility], the front desk staff can help you. However, please note that this inbox is NOT monitored on nights or weekends, and response can take up to 2 business days.  Urgent matters should be discussed with the on-call pediatric neurologist.   At [facility], we are committed to providing exceptional care. You will receive a patient satisfaction survey through text or email regarding your visit today. Your opinion is important to me. Comments are appreciated.

## 2024-01-07 ENCOUNTER — Telehealth: Payer: Self-pay

## 2024-01-07 NOTE — Telephone Encounter (Signed)
 [facility] has not opened up the schedule for new patients. Please refer the patient to another office or have the patient call back in June.  Copied from CRM [id number]. Topic: Appointments - Scheduling Inquiry for Clinic >> Jan 06, 2024  2:24 PM Arlie Benedict B wrote: Patient/patient representative (father) Cjay Helmes [phone] was calling attempting to establish care for his son. I was unable to locate a schedule for the patient please call [phone] to advise the father of an available appt.

## 2024-04-08 ENCOUNTER — Emergency Department (HOSPITAL_COMMUNITY)
Admission: EM | Admit: 2024-04-08 | Discharge: 2024-04-08 | Disposition: A | Discharge status: Home / Self Care | Attending: Emergency Medicine | Admitting: Emergency Medicine

## 2024-04-08 ENCOUNTER — Other Ambulatory Visit: Payer: Self-pay

## 2024-04-08 ENCOUNTER — Emergency Department (HOSPITAL_COMMUNITY)

## 2024-04-08 DIAGNOSIS — R112 Nausea with vomiting, unspecified: Secondary | ICD-10-CM | POA: Diagnosis present

## 2024-04-08 DIAGNOSIS — N201 Calculus of ureter: Secondary | ICD-10-CM

## 2024-04-08 DIAGNOSIS — D72829 Elevated white blood cell count, unspecified: Secondary | ICD-10-CM | POA: Insufficient documentation

## 2024-04-08 DIAGNOSIS — N132 Hydronephrosis with renal and ureteral calculous obstruction: Secondary | ICD-10-CM | POA: Insufficient documentation

## 2024-04-08 LAB — URINALYSIS, W/ REFLEX TO CULTURE (INFECTION SUSPECTED)
Bilirubin Urine: NEGATIVE
Glucose, UA: NEGATIVE mg/dL
Ketones, ur: NEGATIVE mg/dL
Leukocytes,Ua: NEGATIVE
Nitrite: NEGATIVE
Protein, ur: 30 mg/dL — AB
Specific Gravity, Urine: 1.016 (ref 1.005–1.030)
pH: 7 (ref 5.0–8.0)

## 2024-04-08 LAB — CBC WITH DIFFERENTIAL/PLATELET
Abs Immature Granulocytes: 0.04 K/uL (ref 0.00–0.07)
Basophils Absolute: 0.1 K/uL (ref 0.0–0.1)
Basophils Relative: 1 %
Eosinophils Absolute: 0.2 K/uL (ref 0.0–0.5)
Eosinophils Relative: 1 %
HCT: 49.2 % (ref 39.0–52.0)
Hemoglobin: 15.8 g/dL (ref 13.0–17.0)
Immature Granulocytes: 0 %
Lymphocytes Relative: 12 %
Lymphs Abs: 1.6 K/uL (ref 0.7–4.0)
MCH: 30.1 pg (ref 26.0–34.0)
MCHC: 32.1 g/dL (ref 30.0–36.0)
MCV: 93.7 fL (ref 80.0–100.0)
Monocytes Absolute: 0.9 K/uL (ref 0.1–1.0)
Monocytes Relative: 7 %
Neutro Abs: 10.5 K/uL — ABNORMAL HIGH (ref 1.7–7.7)
Neutrophils Relative %: 79 %
Platelets: 508 K/uL — ABNORMAL HIGH (ref 150–400)
RBC: 5.25 MIL/uL (ref 4.22–5.81)
RDW: 14 % (ref 11.5–15.5)
WBC: 13.3 K/uL — ABNORMAL HIGH (ref 4.0–10.5)
nRBC: 0 % (ref 0.0–0.2)

## 2024-04-08 LAB — BASIC METABOLIC PANEL WITH GFR
Anion gap: 12 (ref 5–15)
BUN: 11 mg/dL (ref 6–20)
CO2: 24 mmol/L (ref 22–32)
Calcium: 9.2 mg/dL (ref 8.9–10.3)
Chloride: 101 mmol/L (ref 98–111)
Creatinine, Ser: 1.08 mg/dL (ref 0.61–1.24)
GFR, Estimated: >60 mL/min (ref >60)
Glucose, Bld: 162 mg/dL — ABNORMAL HIGH (ref 70–99)
Potassium: 2.8 mmol/L — ABNORMAL LOW (ref 3.5–5.1)
Sodium: 137 mmol/L (ref 135–145)

## 2024-04-08 MED ORDER — ONDANSETRON 4 MG PO TBDP: 4.0000 mg | ORAL_TABLET | Freq: Three times a day (TID) | ORAL | 0 refills | Status: DC | PRN

## 2024-04-08 MED ORDER — ONDANSETRON HCL 4 MG/2ML IJ SOLN
4.0000 mg | Freq: Once | INTRAMUSCULAR | Status: AC
  Administered 2024-04-08: 4 mg via INTRAVENOUS
  Filled 2024-04-08: qty 2

## 2024-04-08 MED ORDER — HYDROMORPHONE HCL 1 MG/ML IJ SOLN
1.0000 mg | Freq: Once | INTRAMUSCULAR | Status: AC
  Administered 2024-04-08: 1 mg via INTRAVENOUS
  Filled 2024-04-08: qty 1

## 2024-04-08 MED ORDER — KETOROLAC TROMETHAMINE 30 MG/ML IJ SOLN
30.0000 mg | Freq: Once | INTRAMUSCULAR | Status: AC
  Administered 2024-04-08: 30 mg via INTRAVENOUS
  Filled 2024-04-08: qty 1

## 2024-04-08 MED ORDER — TAMSULOSIN HCL 0.4 MG PO CAPS: 0.4000 mg | ORAL_CAPSULE | Freq: Every day | ORAL | 0 refills | Status: DC

## 2024-04-08 MED ORDER — ZONISAMIDE 100 MG PO CAPS
300.0000 mg | ORAL_CAPSULE | Freq: Once | ORAL | Status: AC
  Administered 2024-04-08: 300 mg via ORAL
  Filled 2024-04-08: qty 3

## 2024-04-08 MED ORDER — METOCLOPRAMIDE HCL 5 MG/ML IJ SOLN
10.0000 mg | INTRAMUSCULAR | Status: AC
  Administered 2024-04-08: 10 mg via INTRAVENOUS
  Filled 2024-04-08: qty 2

## 2024-04-08 MED ORDER — OXYCODONE-ACETAMINOPHEN 5-325 MG PO TABS: 1.0000 | ORAL_TABLET | ORAL | 0 refills | Status: DC | PRN

## 2024-04-08 MED ORDER — LAMOTRIGINE 100 MG PO TABS
200.0000 mg | ORAL_TABLET | Freq: Once | ORAL | Status: AC
  Administered 2024-04-08: 200 mg via ORAL
  Filled 2024-04-08: qty 2

## 2024-04-08 NOTE — ED Triage Notes (Signed)
 PT BIB EMS coming from home, c/o of left flank pain that started 1 hr ago. States vomiting 5 times in the last hour. Hx: kidney stones and Epilepsy  EMS:  BP: 132/68, Hr 64, Spo2 98%, Temp 97.5, CBG 161

## 2024-04-08 NOTE — Discharge Instructions (Signed)
 As we discussed, you were found to have a 2 mm left ureteral calculus today.  Ideally, this should pass within the next 48 hours or so. Take the pain medication as prescribed.  Nausea medicine as needed. Follow-up with urology. Turn here for new concerns.

## 2024-04-08 NOTE — ED Notes (Signed)
 Patient transported to CT

## 2024-04-08 NOTE — ED Provider Notes (Signed)
 [place] EMERGENCY DEPARTMENT AT [facility] Provider Note   CSN: [id number] Arrival date & time: 04/08/24  0015     Patient presents with: Flank Pain   Franklin [name] is a 28 y.o. male.   The history is provided by the patient and medical records.  Flank Pain   28 Y.[name] with hx of seizure disorder, kidney stones, presenting to the ED for left flank pain.  States it started around 10:45PM, gradually worsening.  Multiple episodes of nausea/vomiting at home.  No fever/chills.  Prior hx of kidney stones which felt similar.    Prior to Admission medications   Medication Sig Start Date End Date Taking? Authorizing Provider  oxyCODONE -acetaminophen  (PERCOCET) 5-325 MG tablet Take 1 tablet by mouth every 4 (four) hours as needed. 04/08/24  Yes Jarold Olam HERO, PA-C  tamsulosin  (FLOMAX ) 0.4 MG CAPS capsule Take 1 capsule (0.4 mg total) by mouth daily after supper. 04/08/24  Yes Jarold Olam HERO, PA-C  lamoTRIgine  (LAMICTAL ) 200 MG tablet TAKE 1 TABLET BY MOUTH IN THE MORNING AND 2 AT NIGHT 01/06/24   Marianna City, NP  ondansetron  (ZOFRAN -ODT) 4 MG disintegrating tablet Take 1 tablet (4 mg total) by mouth every 8 (eight) hours as needed. 04/08/24   Jarold Olam HERO, PA-C  zonisamide  (ZONEGRAN ) 100 MG capsule Take 3 capsules at bedtime 01/06/24   Marianna City, NP    Allergies: Concerta [methylphenidate]    Review of Systems  Genitourinary:  Positive for flank pain.  All other systems reviewed and are negative.   Updated Vital Signs BP 124/74 (BP Location: Right Arm)   Pulse 61   Temp 98.4 F (36.9 C) (Oral)   Resp 18   Ht 5' 9 (1.753 m)   Wt 58.1 kg   SpO2 96%   BMI 18.90 kg/m   Physical Exam Vitals and nursing note reviewed.  Constitutional:      Appearance: He is well-developed.  HENT:     Head: Normocephalic and atraumatic.  Eyes:     Conjunctiva/sclera: Conjunctivae normal.     Pupils: Pupils are equal, round, and reactive to light.  Cardiovascular:      Rate and Rhythm: Normal rate and regular rhythm.     Heart sounds: Normal heart sounds.  Pulmonary:     Effort: Pulmonary effort is normal.     Breath sounds: Normal breath sounds.  Abdominal:     General: Bowel sounds are normal.     Palpations: Abdomen is soft.     Comments: Tenderness left lateral abdomen, no rash or other skin abnormalities  Musculoskeletal:        General: Normal range of motion.     Cervical back: Normal range of motion.  Skin:    General: Skin is warm and dry.  Neurological:     Mental Status: He is alert and oriented to person, place, and time.     (all labs ordered are listed, but only abnormal results are displayed) Labs Reviewed  CBC WITH DIFFERENTIAL/PLATELET - Abnormal; Notable for the following components:      Result Value   WBC 13.3 (*)    Platelets 508 (*)    Neutro Abs 10.5 (*)    All other components within normal limits  BASIC METABOLIC PANEL WITH GFR - Abnormal; Notable for the following components:   Potassium 2.8 (*)    Glucose, Bld 162 (*)    All other components within normal limits  URINALYSIS, W/ REFLEX TO CULTURE (INFECTION SUSPECTED) -  Abnormal; Notable for the following components:   APPearance CLOUDY (*)    Hgb urine dipstick MODERATE (*)    Protein, ur 30 (*)    Bacteria, UA RARE (*)    All other components within normal limits  URINE CULTURE    EKG: None  Radiology: CT Renal Stone Study Result Date: 04/08/2024 CLINICAL DATA:  Flank pain EXAM: CT ABDOMEN AND PELVIS WITHOUT CONTRAST TECHNIQUE: Multidetector CT imaging of the abdomen and pelvis was performed following the standard protocol without IV contrast. RADIATION DOSE REDUCTION: This exam was performed according to the departmental dose-optimization program which includes automated exposure control, adjustment of the mA and/or kV according to patient size and/or use of iterative reconstruction technique. COMPARISON:  CT 09/04/2023 FINDINGS: Lower chest: Lung bases  demonstrate no acute airspace disease. Hepatobiliary: No focal liver abnormality is seen. No gallstones, gallbladder wall thickening, or biliary dilatation. Pancreas: Unremarkable. No pancreatic ductal dilatation or surrounding inflammatory changes. Spleen: Normal in size without focal abnormality. Adrenals/Urinary Tract: Adrenal glands are normal. Punctate intrarenal stones bilaterally. Mild left hydronephrosis and hydroureter, secondary to a 2 mm stone in the distal left ureter slightly proximal to the left UVJ, coronal series 8, image 51. The bladder is unremarkable Stomach/Bowel: Stomach is within normal limits. Appendix appears normal. No evidence of bowel wall thickening, distention, or inflammatory changes. Vascular/Lymphatic: No significant vascular findings are present. No enlarged abdominal or pelvic lymph nodes. Reproductive: Prostate is unremarkable. Other: Negative for pelvic effusion or free air Musculoskeletal: No acute or suspicious osseous abnormality. IMPRESSION: 1. Mild left hydronephrosis and hydroureter, secondary to a 2 mm stone in the distal left ureter slightly proximal to the left UVJ. 2. Punctate intrarenal stones bilaterally. Electronically Signed   By: Luke Bun M.D.   On: 04/08/2024 01:48     Procedures   Medications Ordered in the ED  HYDROmorphone  (DILAUDID ) injection 1 mg (1 mg Intravenous Given 04/08/24 0053)  ondansetron  (ZOFRAN ) injection 4 mg (4 mg Intravenous Given 04/08/24 0053)  zonisamide  (ZONEGRAN ) capsule 300 mg (300 mg Oral Given 04/08/24 0158)  lamoTRIgine  (LAMICTAL ) tablet 200 mg (200 mg Oral Given 04/08/24 0158)  ketorolac  (TORADOL ) 30 MG/ML injection 30 mg (30 mg Intravenous Given 04/08/24 0214)  metoCLOPramide  (REGLAN ) injection 10 mg (10 mg Intravenous Given 04/08/24 0214)                                    Medical Decision Making Amount and/or Complexity of Data Reviewed Labs: ordered. Radiology: ordered and independent interpretation  performed. ECG/medicine tests: ordered and independent interpretation performed.  Risk Prescription drug management.   28 y.o. M here with left flank pain that began around 10:45PM, worsening since onset.  Several episodes of nausea/vomiting.  Hx of stones in the past that felt similar.    Afebrile, non-toxic here.  Some pain to lateral left abdomen.  Will obtain labs, UA, CT stone study.  Pain and nausea meds given.  Requested home seizure meds as well, have been ordered.  Labs with leukocytosis at 13.3, likely reactive from vomiting.  Normal renal function, no electrolyte derangement.  UA with hematuria, few bacteria.  Not clinically concerning for UTI.  CT pending.  2:42 AM Patient is pain-free on recheck.  CT does confirm 2 mm left distal ureteral calculus with some mild hydro.  Results discussed with patient and his father, Mitchell, who is legal guardian via phone.  All voiced understanding.  Plan to discharge home with symptomatic care and urology follow-up.  Can return here for new concerns.  Final diagnoses:  Left ureteral calculus    ED Discharge Orders          Ordered    oxyCODONE -acetaminophen  (PERCOCET) 5-325 MG tablet  Every 4 hours PRN        04/08/24 0239     04/08/24 0239    tamsulosin  (FLOMAX ) 0.4 MG CAPS capsule  Daily after supper        04/08/24 0239    ondansetron  (ZOFRAN -ODT) 4 MG disintegrating tablet  Every 8 hours PRN        04/08/24 0241               Jarold Olam HERO, PA-C 04/08/24 0340    Jerral Meth, MD 04/08/24 [id number]

## 2024-04-09 LAB — URINE CULTURE

## 2024-04-16 NOTE — Progress Notes (Unsigned)
 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 He is taking and tolerating Lamictal  and Zonisamide  for his seizure disorder. There have been problems in the past with lack of insurance affecting medication options as well as problems with his memory and intellect, despite use of written instructions. He has been treated with Vitamin D3 for Vitamin D  deficiency. He has had problems with weight loss related to food insecurity.   Today's concerns: He has remained seizure free since his last visit He continues to wear a knee brace and reports ongoing knee pain He was seen in the ED earlier this month for renal stones Franklin [name] has been otherwise generally healthy since he was last seen. No health concerns today other than previously mentioned.  Review of systems: Please see HPI for neurologic and other pertinent review of systems. Otherwise all other systems were reviewed and were negative.  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   Body mass index (BMI) of 20.0-20.9 in adult 08/17/2022   Does not have health insurance 10/24/2021   Adjustment disorder with mixed anxiety and depressed mood 09/10/2021   Poor sleep 03/01/2021    Generalized anxiety disorder 04/10/2020   Loss of weight 04/10/2020   Seizure (HCC) 05/13/2019   Hypokalemia 05/12/2019   Hypomagnesemia 05/12/2019   Hypocalcemia 05/12/2019   Intellectual delay 03/10/2018   Personal history of prematurity 03/10/2018   Generalized convulsive epilepsy (HCC) 06/14/2013   Long-term use of high-risk medication 06/14/2013     Past Medical History:  Diagnosis Date   Prematurity    Retinopathy of prematurity    Seizures (HCC)     Past medical history comments: See HPI Copied from previous record: When he was in school he was diagnosed with attention deficit disorder and central auditory processing deficit by Dr Franklin [name]. He was tried on Concerta which caused significant emotional disturbance, then on Ritalin, then Adderall which worked well. 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   Birth History: He was born at [redacted] weeks gestation, weighing 1 lb 13 1/2 oz. He required ventilator support for sometime. He reportedly had no CNS hemorrhage. Growth and development was delayed  Surgical history: Past Surgical History:  Procedure Laterality Date   Central line placement     As premature infant   REFRACTIVE SURGERY     For retinopathy of prematurity     Family history: family history includes Autism in his brother; Diabetes type II in his father; Hypercholesterolemia in his father; Hypertension in his father; Stroke in his paternal aunt and paternal grandmother.  Social history: Social History   Socioeconomic History   Marital status: Single    Spouse name: Not on file   Number of children: Not on file   Years of education: Not on file   Highest education level: Not on file  Occupational History   Not on file   Tobacco Use   Smoking status: Never    Passive exposure: Current (Cousin smokes in home)   Smokeless tobacco: Never  Vaping Use   Vaping status: Some Days   Substances: Flavoring  Substance and Sexual Activity   Alcohol use: Never   Drug use: Never   Sexual activity: Never  Other Topics Concern   Not on file  Social History Narrative   Parents are divorced. Franklin [name] lives with his father.   Social Drivers of Corporate investment banker Strain: Low Risk  (05/12/2019)   Overall Financial Resource Strain (CARDIA)    Difficulty of Paying Living Expenses: Not hard at all  Food Insecurity: No Food Insecurity (09/04/2023)   Hunger Vital Sign    Worried About Running Out of Food in the Last Year: Never true    Ran Out of Food in the Last Year: Never true  Transportation Needs: No Transportation Needs (09/04/2023)   PRAPARE - Administrator, Civil Service (Medical): No    Lack of Transportation (Non-Medical): No  Physical Activity: Sufficiently Active (05/12/2019)   Exercise Vital Sign    Days of Exercise per Week: 7 days    Minutes of Exercise per Session: 30 min  Stress: Stress Concern Present (05/12/2019)   Franklin [name] of Occupational Health - Occupational Stress Questionnaire    Feeling of Stress : To some extent  Social Connections: Socially Isolated (05/12/2019)   Social Connection and Isolation Panel    Frequency of Communication with Friends and Family: Once a week    Frequency of Social Gatherings with Friends and Family: Once a week    Attends Religious Services: Never    Database administrator or Organizations: No    Attends Banker Meetings: Never    Marital Status: Never married  Intimate Partner Violence: Not At Risk (09/04/2023)   Humiliation, Afraid, Rape, and Kick questionnaire    Fear of Current or Ex-Partner: No    Emotionally Abused: No    Physically Abused: No    Sexually Abused: No    Past/failed meds: Copied from previous  record: Levetiracetam  - did not control seizures Fycompa  - did not control seizures and made him dizzy Lamotrigine  IR - dizziness Oxtellar XR  - cost Briviact  - had difficulty getting it from the pharmacy Allergies: Allergies  Allergen Reactions   Concerta [Methylphenidate] Other (See Comments)    Would start crying for little things like when someone coughed or said a specific word for example.     Immunizations: Immunization History  Administered Date(s) Administered   PFIZER(Purple Top)SARS-COV-2 Vaccination 12/08/2019, 01/01/2020   Tdap 06/30/2015, 09/14/2018, 02/18/2022   Diagnostics/Screenings: Copied from previous record: 09/14/2022 MRI Brain wo contrast - 1. No acute brain finding. 2. 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 3. Question mild volume loss and increased FLAIR signal within the hippocampus on the left compared to the right. I am not sure this is definite, but could indicate subtle mesial temporal sclerosis on the left. Relative asymmetric prominence of the occipital horn of the left lateral ventricle but without evidence  of definable focal abnormality of the left occipital lobe.   07/03/2022 - rEEG - This is a abnormal record with the patient in awake and drowsy states due to generalized slowing.  No evidence of decreased seizure threshold, however does not rule out epilepsy.  Clinical correlation advised. Corean Geralds MD MPH  Physical Exam: BP 110/70 (BP Location: Left Arm, Patient Position: Sitting, Cuff Size: Normal)   Pulse 96   Ht 5' 8.31 (1.735 m)   Wt 126 lb (57.2 kg)   BMI 18.99 kg/m  Wt Readings from Last 3 Encounters:  04/17/24 126 lb (57.2 kg)  04/08/24 128 lb (58.1 kg)  01/06/24 127 lb 6.8 oz (57.8 kg)     General: thin but well developed, well nourished man, seated on exam table, in no evident distress Head: normocephalic and atraumatic. No  dysmorphic features. Neck: supple Musculoskeletal: No skeletal deformities or obvious scoliosis. Wearing knee brace on left knee Skin: no rashes or neurocutaneous lesions  Neurologic Exam Mental Status: Awake and fully alert.  Attention span, concentration, and fund of knowledge subnormal for age.  Speech fluent without dysarthria.  Able to follow commands and participate in examination. Cranial Nerves: Turns to localize faces, objects and sounds in the periphery. Facial sensation intact.  Face, tongue, palate move normally and symmetrically. Motor: Normal functional bulk, tone and strength Sensory: Intact to touch and temperature in all extremities. Coordination: Finger-to-nose and heel-to-shin intact bilaterally. Balance adequate Gait and Station: Arises from chair, without difficulty. Stance is normal.  Gait demonstrates limp due to knee brace but otherwise normal  Impression: Generalized convulsive epilepsy (HCC)  Intellectual delay  Focal epilepsy (HCC)  Generalized anxiety disorder   Recommendations for plan of care: The patient's previous Epic records were reviewed. No recent diagnostic studies to be reviewed with the patient.  Plan until next visit: Continue medications as prescribed  Let me know if seizures occur Continue follow up with orthopedics Work on drinking more water Work on getting appointment with PCP Call for questions or concerns Return in about 4 months (around 08/17/2024).  The medication list was reviewed and reconciled. No changes were made in the prescribed medications today. A complete medication list was provided to the patient.   Allergies as of 04/17/2024       Reactions   Concerta [methylphenidate] Other (See Comments)   Would start crying for little things like when someone coughed or said a specific word for example.         Medication List        Accurate as of April 17, 2024 11:59 PM. If you have any questions, ask your nurse or  doctor.          lamoTRIgine  200 MG tablet Commonly known as: LAMICTAL  TAKE 1 TABLET BY MOUTH IN THE MORNING AND 2 AT NIGHT   ondansetron  4 MG disintegrating tablet Commonly known as: ZOFRAN -ODT Take 1 tablet (4 mg total) by mouth every 8 (eight) hours as needed.   oxyCODONE -acetaminophen  5-325 MG tablet Commonly known as: Percocet Take 1 tablet by mouth every 4 (four) hours as needed.   tamsulosin  0.4 MG Caps capsule Commonly known as: FLOMAX  Take 1 capsule (0.4 mg total) by mouth daily after supper.   zonisamide  100 MG capsule Commonly known as: ZONEGRAN  Take 3 capsules at bedtime      Total time spent with the patient was 25 minutes, of which 50% or more was spent in counseling and coordination of care.  Ellouise Bollman NP-C [facility] [address], [address] [place], [place] [address] Ph. [phone] Fax [phone]

## 2024-04-17 ENCOUNTER — Ambulatory Visit (INDEPENDENT_AMBULATORY_CARE_PROVIDER_SITE_OTHER): Admitting: Family

## 2024-04-17 ENCOUNTER — Encounter (INDEPENDENT_AMBULATORY_CARE_PROVIDER_SITE_OTHER): Payer: Self-pay | Admitting: Family

## 2024-04-17 VITALS — BP 110/70 | HR 96 | Ht 68.31 in | Wt 126.0 lb

## 2024-04-17 DIAGNOSIS — G40109 Localization-related (focal) (partial) symptomatic epilepsy and epileptic syndromes with simple partial seizures, not intractable, without status epilepticus: Secondary | ICD-10-CM

## 2024-04-17 DIAGNOSIS — F819 Developmental disorder of scholastic skills, unspecified: Secondary | ICD-10-CM

## 2024-04-17 DIAGNOSIS — G40309 Generalized idiopathic epilepsy and epileptic syndromes, not intractable, without status epilepticus: Secondary | ICD-10-CM

## 2024-04-17 DIAGNOSIS — F411 Generalized anxiety disorder: Secondary | ICD-10-CM

## 2024-04-17 NOTE — Patient Instructions (Addendum)
 It was a pleasure to see you today!  Instructions for you until your next appointment are as follows: Continue to take the seizure medicines as prescribed Let me know if seizures occur Continue to follow up with your orthopedic doctor about yoru knee Be sure to drink plenty of water each day because of your history of kidney stones Please work on getting an appointment with a PCP Please sign up for MyChart if you have not done so. Please plan to return for follow up in December or sooner if needed.  Feel free to contact our office during normal business hours at [phone] with questions or concerns. If there is no answer or the call is outside business hours, please leave a message and our clinic staff will call you back within the next business day.  If you have an urgent concern, please stay on the line for our after-hours answering service and ask for the on-call neurologist.     I also encourage you to use MyChart to communicate with me more directly. If you have not yet signed up for MyChart within [facility], the front desk staff can help you. However, please note that this inbox is NOT monitored on nights or weekends, and response can take up to 2 business days.  Urgent matters should be discussed with the on-call pediatric neurologist.   At [facility], we are committed to providing exceptional care. You will receive a patient satisfaction survey through text or email regarding your visit today. Your opinion is important to me. Comments are appreciated.

## 2024-04-19 ENCOUNTER — Encounter (INDEPENDENT_AMBULATORY_CARE_PROVIDER_SITE_OTHER): Payer: Self-pay | Admitting: Family

## 2024-05-18 ENCOUNTER — Telehealth (INDEPENDENT_AMBULATORY_CARE_PROVIDER_SITE_OTHER): Payer: Self-pay | Admitting: Family

## 2024-05-18 DIAGNOSIS — Z87898 Personal history of other specified conditions: Secondary | ICD-10-CM

## 2024-05-18 DIAGNOSIS — F4323 Adjustment disorder with mixed anxiety and depressed mood: Secondary | ICD-10-CM

## 2024-05-18 DIAGNOSIS — F819 Developmental disorder of scholastic skills, unspecified: Secondary | ICD-10-CM

## 2024-05-18 DIAGNOSIS — G40109 Localization-related (focal) (partial) symptomatic epilepsy and epileptic syndromes with simple partial seizures, not intractable, without status epilepticus: Secondary | ICD-10-CM

## 2024-05-18 DIAGNOSIS — G40309 Generalized idiopathic epilepsy and epileptic syndromes, not intractable, without status epilepticus: Secondary | ICD-10-CM

## 2024-05-18 NOTE — Telephone Encounter (Signed)
 Mom came to the office with concern about Franklin [name]'s mood and behavior. She is concerned about his memory as he forgets to refill his medications and his daily pillbox, and when meds are missed he has breakthrough seizures. He doesn't seem to be aware of safety concerns or remember warnings about safety issues. Franklin [name] can be rigid about food intake and is quite thin. She notes that he has expressed that he is gay and no longer believes in God. Mom is accepting of his sexual preference but is worried about him not going to heaven if he does not believe in God. She asked if he could be placed somewhere for training to help him to do better self care and for job training. We talked about Franklin [name]'s cognitive abilities. He did not do well in school and Mom feels that the school failed to teach him. She would like for Franklin [name] to attend college and/or do something to support his interest in drawing. She notes that he will only draw things that he wants to do. I talked with Mom about her concerns and explained that there is no facility that I am aware of for Franklin [name] to be placed. I strongly recommended counseling for him as he has benefited from short term Integrative Health services in the past. I gave Mom information on the [facility] and recommended that he go there to get resources for a regular therapy provider. I also recommended testing for Franklin [name] but cautioned Mom that the wait list was generally long. She agreed with these plans today.

## 2024-06-05 ENCOUNTER — Telehealth (INDEPENDENT_AMBULATORY_CARE_PROVIDER_SITE_OTHER): Payer: Self-pay | Admitting: Family

## 2024-06-05 DIAGNOSIS — E871 Hypo-osmolality and hyponatremia: Secondary | ICD-10-CM

## 2024-06-05 DIAGNOSIS — R634 Abnormal weight loss: Secondary | ICD-10-CM

## 2024-06-05 DIAGNOSIS — E876 Hypokalemia: Secondary | ICD-10-CM

## 2024-06-05 DIAGNOSIS — G40309 Generalized idiopathic epilepsy and epileptic syndromes, not intractable, without status epilepticus: Secondary | ICD-10-CM

## 2024-06-05 DIAGNOSIS — Z79899 Other long term (current) drug therapy: Secondary | ICD-10-CM

## 2024-06-05 NOTE — Telephone Encounter (Signed)
 Who's calling (name and relationship to patient) : Franklin [name]; dad   Best contact number: [phone]  Provider they see: Ellouise Bollman, NP   Reason for call: Dad called in wanting to let Ellouise know that [name] had a seizure about 30 mins ago and lasted about a couple mins. Dad stated that he also had a little nose bleed. Dad stated he is not sure if meds were taken.    Call ID:      PRESCRIPTION REFILL ONLY  Name of prescription:  Pharmacy:

## 2024-06-05 NOTE — Telephone Encounter (Signed)
 I called and spoke with Franklin [name]'s father. He said that Franklin [name] had a 2 minute seizure. He was in a chair and did not receive injuries as part of the seizure. Dad is unsure if Obryan has missed any medication doses but doesn't think so because his weekly pill box looks appropriate for the day. I recommended having Faolan come in for lab draw on Thursday to check CBC, CMP, Zonisamide  and Lamotrigine  levels. Dad agreed with this plan.

## 2024-07-23 ENCOUNTER — Emergency Department (HOSPITAL_COMMUNITY)
Admission: EM | Admit: 2024-07-23 | Discharge: 2024-07-24 | Disposition: A | Discharge status: Home / Self Care | Attending: Emergency Medicine | Admitting: Emergency Medicine

## 2024-07-23 ENCOUNTER — Encounter (HOSPITAL_COMMUNITY): Payer: Self-pay | Admitting: *Deleted

## 2024-07-23 ENCOUNTER — Other Ambulatory Visit: Payer: Self-pay

## 2024-07-23 DIAGNOSIS — R569 Unspecified convulsions: Secondary | ICD-10-CM | POA: Diagnosis present

## 2024-07-23 DIAGNOSIS — E876 Hypokalemia: Secondary | ICD-10-CM | POA: Diagnosis not present

## 2024-07-23 DIAGNOSIS — G40309 Generalized idiopathic epilepsy and epileptic syndromes, not intractable, without status epilepticus: Secondary | ICD-10-CM

## 2024-07-23 MED ORDER — ACETAMINOPHEN 325 MG PO TABS
650.0000 mg | ORAL_TABLET | Freq: Once | ORAL | Status: AC
  Administered 2024-07-24: 650 mg via ORAL
  Filled 2024-07-23: qty 2

## 2024-07-23 MED ORDER — LAMOTRIGINE 100 MG PO TABS
400.0000 mg | ORAL_TABLET | Freq: Once | ORAL | Status: AC
  Administered 2024-07-24: 400 mg via ORAL
  Filled 2024-07-23: qty 4

## 2024-07-23 NOTE — ED Provider Notes (Signed)
  [facility] AT [facility] Provider Note   CSN: [id number] Arrival date & time: 07/23/24  2330     Patient presents with: Seizures   Franklin [name] is a 28 y.o. male.  {Add pertinent medical, surgical, social history, OB history to HPI:32947}  Seizures Franklin [name] is a 28 y.o. male who presents to the Emergency Department complaining of *** Seizure in the kitchen Out of lamictal  No recent illnesses.     Prior to Admission medications   Medication Sig Start Date End Date Taking? Authorizing Provider  lamoTRIgine  (LAMICTAL ) 200 MG tablet TAKE 1 TABLET BY MOUTH IN THE MORNING AND 2 AT NIGHT 01/06/24   Marianna City, NP  ondansetron  (ZOFRAN -ODT) 4 MG disintegrating tablet Take 1 tablet (4 mg total) by mouth every 8 (eight) hours as needed. 04/08/24   Jarold Olam HERO, PA-C  oxyCODONE -acetaminophen  (PERCOCET) 5-325 MG tablet Take 1 tablet by mouth every 4 (four) hours as needed. Patient not taking: Reported on 04/17/2024 04/08/24   Jarold Olam HERO, PA-C  tamsulosin  (FLOMAX ) 0.4 MG CAPS capsule Take 1 capsule (0.4 mg total) by mouth daily after supper. Patient not taking: Reported on 04/17/2024 04/08/24   Jarold Olam HERO, PA-C  zonisamide  (ZONEGRAN ) 100 MG capsule Take 3 capsules at bedtime 01/06/24   Marianna City, NP    Allergies: Concerta [methylphenidate]    Review of Systems  Neurological:  Positive for seizures.    Updated Vital Signs BP 121/70 (BP Location: Right Arm)   Pulse 92   Temp 97.7 F (36.5 C) (Oral)   Resp 16   SpO2 98%   Physical Exam  (all labs ordered are listed, but only abnormal results are displayed) Labs Reviewed - No data to display  EKG: None  Radiology: No results found.  {Document cardiac monitor, telemetry assessment procedure when appropriate:32947} Procedures   Medications Ordered in the ED - No data to display    {Click here for ABCD2, HEART and other calculators REFRESH Note before signing:1}                               Medical Decision Making  ***  {Document critical care time when appropriate  Document review of labs and clinical decision tools ie CHADS2VASC2, etc  Document your independent review of radiology images and any outside records  Document your discussion with family members, caretakers and with consultants  Document social determinants of health affecting pt's care  Document your decision making why or why not admission, treatments were needed:32947:::1}   Final diagnoses:  None    ED Discharge Orders     None

## 2024-07-23 NOTE — ED Triage Notes (Signed)
 Pt arrived from home with EMS for seizures, known hx of same.Was doing the dishes tonight, seized, fell, hitting Rt side of forehead, c-collar in place.   Out of  lamotrigine  ( last took this morning one dose this morning)   Bp108/60, spO2 100%RA, pulse 100, cbg 91, 98.5  18g LAC

## 2024-07-24 ENCOUNTER — Telehealth (INDEPENDENT_AMBULATORY_CARE_PROVIDER_SITE_OTHER): Payer: Self-pay | Admitting: Family

## 2024-07-24 ENCOUNTER — Emergency Department (HOSPITAL_COMMUNITY)

## 2024-07-24 LAB — CBC WITH DIFFERENTIAL/PLATELET
Abs Immature Granulocytes: 0.03 K/uL (ref 0.00–0.07)
Basophils Absolute: 0.1 K/uL (ref 0.0–0.1)
Basophils Relative: 1 %
Eosinophils Absolute: 0.3 K/uL (ref 0.0–0.5)
Eosinophils Relative: 3 %
HCT: 43.1 % (ref 39.0–52.0)
Hemoglobin: 14.8 g/dL (ref 13.0–17.0)
Immature Granulocytes: 0 %
Lymphocytes Relative: 17 %
Lymphs Abs: 1.7 K/uL (ref 0.7–4.0)
MCH: 31.7 pg (ref 26.0–34.0)
MCHC: 34.3 g/dL (ref 30.0–36.0)
MCV: 92.3 fL (ref 80.0–100.0)
Monocytes Absolute: 1 K/uL (ref 0.1–1.0)
Monocytes Relative: 10 %
Neutro Abs: 6.9 K/uL (ref 1.7–7.7)
Neutrophils Relative %: 69 %
Platelets: 455 K/uL — ABNORMAL HIGH (ref 150–400)
RBC: 4.67 MIL/uL (ref 4.22–5.81)
RDW: 13.9 % (ref 11.5–15.5)
WBC: 10 K/uL (ref 4.0–10.5)
nRBC: 0 % (ref 0.0–0.2)

## 2024-07-24 LAB — COMPREHENSIVE METABOLIC PANEL WITH GFR
ALT: 50 U/L — ABNORMAL HIGH (ref 0–44)
AST: 41 U/L (ref 15–41)
Albumin: 4.2 g/dL (ref 3.5–5.0)
Alkaline Phosphatase: 183 U/L — ABNORMAL HIGH (ref 38–126)
Anion gap: 10 (ref 5–15)
BUN: 12 mg/dL (ref 6–20)
CO2: 25 mmol/L (ref 22–32)
Calcium: 9.3 mg/dL (ref 8.9–10.3)
Chloride: 106 mmol/L (ref 98–111)
Creatinine, Ser: 0.78 mg/dL (ref 0.61–1.24)
GFR, Estimated: >60 mL/min (ref >60)
Glucose, Bld: 109 mg/dL — ABNORMAL HIGH (ref 70–99)
Potassium: 3.4 mmol/L — ABNORMAL LOW (ref 3.5–5.1)
Sodium: 140 mmol/L (ref 135–145)
Total Bilirubin: 0.3 mg/dL (ref 0.0–1.2)
Total Protein: 6.6 g/dL (ref 6.5–8.1)

## 2024-07-24 LAB — MAGNESIUM: Magnesium: 2.4 mg/dL (ref 1.7–2.4)

## 2024-07-24 LAB — CBG MONITORING, ED: Glucose-Capillary: 109 mg/dL — ABNORMAL HIGH (ref 70–99)

## 2024-07-24 MED ORDER — ZONISAMIDE 100 MG PO CAPS
300.0000 mg | ORAL_CAPSULE | Freq: Once | ORAL | Status: DC
  Filled 2024-07-24: qty 3

## 2024-07-24 MED ORDER — POTASSIUM CHLORIDE 20 MEQ PO PACK
20.0000 meq | PACK | Freq: Once | ORAL | Status: AC
  Administered 2024-07-24: 20 meq via ORAL
  Filled 2024-07-24: qty 1

## 2024-07-24 MED ORDER — ZONISAMIDE 100 MG PO CAPS: ORAL_CAPSULE | ORAL | 0 refills | Status: DC

## 2024-07-24 MED ORDER — LAMOTRIGINE 200 MG PO TABS: ORAL_TABLET | ORAL | 0 refills | Status: DC

## 2024-07-24 NOTE — Telephone Encounter (Signed)
 Dad called to report that Franklin [name] had a seizure last night lasting 1-2 minutes. He had missed some doses of Lamotrigine . I recommended that he get back on track and try not to miss any more doses. I will not make change in his regimen since the seizure was triggered by missed medication.

## 2024-08-16 NOTE — Progress Notes (Signed)
 This is a Pediatric Specialist E-Visit consult/follow up provided via My Chart Video Visit (Caregility). Franklin [name] consented to an E-Visit consult today.  Is the patient present for the video visit? yes Location of patient: Franklin [name] is at home  Is the patient located in the state of [place] ? yes Location of provider: Ellouise Bollman, NP-C is at office Patient was referred by No ref. 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 He is taking and tolerating Lamictal  and Zonisamide  for his seizure disorder. There have been problems in the past with lack of insurance affecting medication options as well as problems with his memory and intellect, despite use of written instructions. He has been treated with Vitamin D3 for Vitamin D  deficiency. He has had problems with weight loss related to food insecurity.   Since last visit: Last seizure occurred July 23, 2024 in the setting of missed medication. Was seen in the ED and released Continues to have ongoing knee pain Reports waking one day with severe back pain that resolved with rest. He is worried about that because several members of his family have chronic back pain, and has scheduled an appointment with an orthopedic provider. Franklin [name] has been otherwise generally healthy since he was last  seen. No health concerns today other than previously mentioned.  Review of systems: Please see HPI for neurologic and other pertinent review of systems. Otherwise all other systems were reviewed and were negative.  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   Body mass index (BMI) of 20.0-20.9 in adult 08/17/2022   Does not have health insurance 10/24/2021   Adjustment disorder with mixed anxiety and depressed mood 09/10/2021   Poor sleep 03/01/2021   Generalized anxiety disorder 04/10/2020   Loss of weight 04/10/2020   Seizure (HCC) 05/13/2019   Hypokalemia 05/12/2019   Hypomagnesemia 05/12/2019   Hypocalcemia 05/12/2019   Intellectual delay 03/10/2018   Personal history of prematurity 03/10/2018   Generalized convulsive epilepsy (HCC) 06/14/2013   Long-term use of high-risk medication 06/14/2013     Past Medical History:  Diagnosis Date   Prematurity    Retinopathy of prematurity    Seizures (HCC)     Past medical history comments: See HPI Copied from previous record: When he was in school he was diagnosed with attention deficit disorder and central auditory processing deficit by Dr Butch. He was tried on Concerta which caused significant emotional disturbance, then on Ritalin, then Adderall which worked well. He has mild hearing loss. [name] has a  sebaceous nevus of [name], which can sometimes be related to underlying brain abnormalities. 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 Birth History: He was born  at [redacted] weeks gestation, weighing 1 lb 13 1/2 oz. He required ventilator support for sometime. He reportedly had no CNS hemorrhage. Growth and development was delayed   Surgical history: Past Surgical History:  Procedure Laterality Date   Central line placement     As premature infant   REFRACTIVE SURGERY     For retinopathy of prematurity     Family history: family history includes Autism in his brother; Diabetes type II in his father; Hypercholesterolemia in his father; Hypertension in his father; Stroke in his paternal aunt and paternal grandmother.   Social history: Social History   Socioeconomic History   Marital status: Single    Spouse name: Not on file   Number of children: Not on file   Years of education: Not on file   Highest education level: Not on file  Occupational History   Not on file  Tobacco Use   Smoking status: Never    Passive exposure: Current (Cousin smokes in home)   Smokeless tobacco: Never  Vaping Use   Vaping status: Some Days   Substances: Flavoring  Substance and Sexual Activity   Alcohol use: Never   Drug use: Never   Sexual activity: Never  Other Topics Concern   Not on file  Social History Narrative   Parents are divorced. Franklin [name] lives with his father.   Social Drivers of Corporate Investment Banker Strain: Low Risk  (05/12/2019)   Overall Financial Resource Strain (CARDIA)    Difficulty of Paying Living Expenses: Not hard at all  Food Insecurity: At Risk (05/30/2024)   Received from [facility] Insecurity    Within the past 12 months, the food you bought just didn't last and you didn't have enough money to get more.: 2  Transportation Needs: Not at Risk (05/30/2024)   Received from [facility] Needs    In the past 12 months, has lack of transportation kept you from medical appointments, meetings, work or from getting things needed for daily living? (Check all that apply): 1  Physical Activity: Sufficiently Active (05/12/2019)    Exercise Vital Sign    Days of Exercise per Week: 7 days    Minutes of Exercise per Session: 30 min  Stress: Stress Concern Present (05/12/2019)   [name] of Occupational Health - Occupational Stress Questionnaire    Feeling of Stress : To some extent  Social Connections: Socially Isolated (05/12/2019)   Social Connection and Isolation Panel    Frequency of Communication with Friends and Family: Once a week    Frequency of Social Gatherings with Friends and Family: Once a week    Attends Religious Services: Never    Database Administrator or Organizations: No    Attends Banker Meetings: Never    Marital Status: Never married  Intimate Partner Violence: Not At Risk (09/04/2023)   Humiliation, Afraid, Rape, and Kick questionnaire    Fear of Current or Ex-Partner: No    Emotionally Abused: No    Physically Abused: No    Sexually Abused: No    Past/failed meds: Copied from previous record: Levetiracetam  - did not control seizures Fycompa  - did not control seizures and made him dizzy Lamotrigine   IR - dizziness Oxtellar XR  - cost Briviact  - had difficulty getting it from the pharmacy  Allergies: Allergies  Allergen Reactions   Concerta [Methylphenidate] Other (See Comments)    Would start crying for little things like when someone coughed or said a specific word for example.       Immunizations: Immunization History  Administered Date(s) Administered   PFIZER(Purple Top)SARS-COV-2 Vaccination 12/08/2019, 01/01/2020   Tdap 06/30/2015, 09/14/2018, 02/18/2022    Diagnostics/Screenings: Copied from previous record: 09/14/2022 MRI Brain wo contrast - 1. No acute brain finding. 2. 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 3. Question mild volume loss and increased FLAIR signal within the hippocampus on the left compared to the right. I am not sure this is definite,  but could indicate subtle mesial temporal sclerosis on the left. Relative asymmetric prominence of the occipital horn of the left lateral ventricle but without evidence of definable focal abnormality of the left occipital lobe.   07/03/2022 - rEEG - This is a abnormal record with the patient in awake and drowsy states due to generalized slowing.  No evidence of decreased seizure threshold, however does not rule out epilepsy.  Clinical correlation advised. Corean Geralds MD MPH   Physical Exam: There were no vitals taken for this visit.  Wt Readings from Last 3 Encounters:  04/17/24 126 lb (57.2 kg)  04/08/24 128 lb (58.1 kg)  01/06/24 127 lb 6.8 oz (57.8 kg)  Examination was limited by virtual visit format General: Well developed, well nourished young man, seated at home, in no evident distress Head: Head normocephalic and atraumatic.  Neck: Supple Musculoskeletal: No obvious deformities or scoliosis Skin: No rashes or neurocutaneous lesions  Neurologic Exam Mental Status: Awake and fully alert.  Oriented to place and time. Attention span, concentration, and fund of knowledge subnormal for age.  Mood and affect appropriate. Cranial Nerves: Turns to localize faces, objects and sounds in the periphery. Face tongue, palate move normally and symmetrically.  Motor: Normal functional bulk, tone and strength Sensory: Intact to touch and temperature in all extremities.  Coordination: No dysmetria with reach for objects  Impression: Generalized convulsive epilepsy (HCC) - Plan: lamoTRIgine  (LAMICTAL ) 200 MG tablet, zonisamide  (ZONEGRAN ) 100 MG capsule  Intellectual delay  Focal epilepsy (HCC)   Recommendations for plan of care: The patient's previous Epic records were reviewed. No recent diagnostic studies to be reviewed with the patient.   Recommendations and plan until next visit: Continue medications as prescribed  Call if seizures occur or for questions or concerns Return for follow  up in 3 months or sooner if needed  The medication list was reviewed and reconciled. No changes were made in the prescribed medications today. A complete medication list was provided to the patient.  Allergies as of 08/17/2024       Reactions   Concerta [methylphenidate] Other (See Comments)   Would start crying for little things like when someone coughed or said a specific word for example.         Medication List        Accurate as of August 17, 2024  6:50 PM. If you have any questions, ask your nurse or doctor.          STOP taking these medications    ondansetron  4 MG disintegrating tablet Commonly known as: ZOFRAN -ODT Stopped by: Franklin Bollman, NP   oxyCODONE -acetaminophen  5-325 MG tablet Commonly known as: Percocet  Stopped by: Franklin Bollman, NP   tamsulosin  0.4 MG Caps capsule Commonly known as: FLOMAX  Stopped by: Franklin Bollman, NP       TAKE these medications    lamoTRIgine  200 MG tablet Commonly known as: LAMICTAL  TAKE 1 TABLET BY MOUTH IN THE MORNING AND 2 AT NIGHT   zonisamide  100 MG capsule Commonly known as: ZONEGRAN  Take 3 capsules at bedtime      I spent 15 minutes caring for the patient today in virtual visit reviewing records, including previous charts and test results, examination of the patient, discussion and education with the patient about his condition, documentation in his chart, developing a plan of care and ordering refills of seizure medications.  Franklin Bollman NP-C [facility] [address], [address] [place], [place] [address] Ph. [phone] Fax [phone]

## 2024-08-17 ENCOUNTER — Telehealth (INDEPENDENT_AMBULATORY_CARE_PROVIDER_SITE_OTHER): Admitting: Family

## 2024-08-17 ENCOUNTER — Encounter (INDEPENDENT_AMBULATORY_CARE_PROVIDER_SITE_OTHER): Payer: Self-pay | Admitting: Family

## 2024-08-17 DIAGNOSIS — F819 Developmental disorder of scholastic skills, unspecified: Secondary | ICD-10-CM | POA: Diagnosis not present

## 2024-08-17 DIAGNOSIS — G40409 Other generalized epilepsy and epileptic syndromes, not intractable, without status epilepticus: Secondary | ICD-10-CM

## 2024-08-17 DIAGNOSIS — G40109 Localization-related (focal) (partial) symptomatic epilepsy and epileptic syndromes with simple partial seizures, not intractable, without status epilepticus: Secondary | ICD-10-CM | POA: Diagnosis not present

## 2024-08-17 DIAGNOSIS — G40309 Generalized idiopathic epilepsy and epileptic syndromes, not intractable, without status epilepticus: Secondary | ICD-10-CM

## 2024-08-17 MED ORDER — LAMOTRIGINE 200 MG PO TABS: ORAL_TABLET | ORAL | 5 refills | Status: AC

## 2024-08-17 MED ORDER — ZONISAMIDE 100 MG PO CAPS: ORAL_CAPSULE | ORAL | 5 refills | Status: AC

## 2024-08-17 NOTE — Progress Notes (Signed)
 Franklin [name]

## 2024-08-17 NOTE — Patient Instructions (Signed)
 It was a pleasure to see you today!  Instructions for you until your next appointment are as follows: Continue taking your medications as prescribed Let me know if you have any more seizures Please sign up for MyChart if you have not done so. Please plan to return for follow up in 3 months or sooner if needed.  Feel free to contact our office during normal business hours at [phone] with questions or concerns. If there is no answer or the call is outside business hours, please leave a message and our clinic staff will call you back within the next business day.  If you have an urgent concern, please stay on the line for our after-hours answering service and ask for the on-call neurologist.     I also encourage you to use MyChart to communicate with me more directly. If you have not yet signed up for MyChart within [facility], the front desk staff can help you. However, please note that this inbox is NOT monitored on nights or weekends, and response can take up to 2 business days.  Urgent matters should be discussed with the on-call pediatric neurologist.   At [facility], we are committed to providing exceptional care. You will receive a patient satisfaction survey through text or email regarding your visit today. Your opinion is important to me. Comments are appreciated.

## 2024-08-28 ENCOUNTER — Telehealth (INDEPENDENT_AMBULATORY_CARE_PROVIDER_SITE_OTHER): Payer: Self-pay | Admitting: Family

## 2024-08-28 NOTE — Telephone Encounter (Signed)
 I received a message from patient's father that he had a 4 minute seizure this morning. He said that it was harder than usual for him and that he was slow to arouse afterwards. I attempted to call Dad back but had to leave a message.

## 2024-08-29 NOTE — Telephone Encounter (Signed)
 I spoke with Kweli's father late in the day yesterday. He was not at home at the time and had no further information about Sally's seizure. I made a plan with him to speak by phone today. I called Dad today but had to leave a message.

## 2024-09-22 ENCOUNTER — Other Ambulatory Visit: Payer: Self-pay

## 2024-09-22 ENCOUNTER — Encounter (HOSPITAL_COMMUNITY): Payer: Self-pay | Admitting: *Deleted

## 2024-09-22 ENCOUNTER — Emergency Department (HOSPITAL_COMMUNITY)
Admission: EM | Admit: 2024-09-22 | Discharge: 2024-09-22 | Disposition: A | Discharge status: Home / Self Care | Attending: Emergency Medicine | Admitting: Emergency Medicine

## 2024-09-22 DIAGNOSIS — Y92009 Unspecified place in unspecified non-institutional (private) residence as the place of occurrence of the external cause: Secondary | ICD-10-CM | POA: Insufficient documentation

## 2024-09-22 DIAGNOSIS — S0181XA Laceration without foreign body of other part of head, initial encounter: Secondary | ICD-10-CM | POA: Diagnosis not present

## 2024-09-22 DIAGNOSIS — W19XXXA Unspecified fall, initial encounter: Secondary | ICD-10-CM | POA: Diagnosis not present

## 2024-09-22 DIAGNOSIS — G40909 Epilepsy, unspecified, not intractable, without status epilepticus: Secondary | ICD-10-CM | POA: Insufficient documentation

## 2024-09-22 DIAGNOSIS — R569 Unspecified convulsions: Secondary | ICD-10-CM

## 2024-09-22 DIAGNOSIS — S01111A Laceration without foreign body of right eyelid and periocular area, initial encounter: Secondary | ICD-10-CM | POA: Diagnosis not present

## 2024-09-22 DIAGNOSIS — R000 Tachycardia, unspecified: Secondary | ICD-10-CM | POA: Insufficient documentation

## 2024-09-22 DIAGNOSIS — S0591XA Unspecified injury of right eye and orbit, initial encounter: Secondary | ICD-10-CM | POA: Diagnosis present

## 2024-09-22 LAB — BASIC METABOLIC PANEL WITH GFR
Anion gap: 11 (ref 5–15)
BUN: 9 mg/dL (ref 6–20)
CO2: 25 mmol/L (ref 22–32)
Calcium: 9 mg/dL (ref 8.9–10.3)
Chloride: 105 mmol/L (ref 98–111)
Creatinine, Ser: 0.77 mg/dL (ref 0.61–1.24)
GFR, Estimated: >60 mL/min
Glucose, Bld: 104 mg/dL — ABNORMAL HIGH (ref 70–99)
Potassium: 3.4 mmol/L — ABNORMAL LOW (ref 3.5–5.1)
Sodium: 141 mmol/L (ref 135–145)

## 2024-09-22 LAB — CBC WITH DIFFERENTIAL/PLATELET
Abs Immature Granulocytes: 0.11 K/uL — ABNORMAL HIGH (ref 0.00–0.07)
Basophils Absolute: 0.1 K/uL (ref 0.0–0.1)
Basophils Relative: 1 %
Eosinophils Absolute: 0.1 K/uL (ref 0.0–0.5)
Eosinophils Relative: 1 %
HCT: 50.4 % (ref 39.0–52.0)
Hemoglobin: 16.7 g/dL (ref 13.0–17.0)
Immature Granulocytes: 1 %
Lymphocytes Relative: 10 %
Lymphs Abs: 0.9 K/uL (ref 0.7–4.0)
MCH: 30.8 pg (ref 26.0–34.0)
MCHC: 33.1 g/dL (ref 30.0–36.0)
MCV: 93 fL (ref 80.0–100.0)
Monocytes Absolute: 0.7 K/uL (ref 0.1–1.0)
Monocytes Relative: 8 %
Neutro Abs: 6.9 K/uL (ref 1.7–7.7)
Neutrophils Relative %: 79 %
Platelets: 466 K/uL — ABNORMAL HIGH (ref 150–400)
RBC: 5.42 MIL/uL (ref 4.22–5.81)
RDW: 13 % (ref 11.5–15.5)
WBC: 8.7 K/uL (ref 4.0–10.5)
nRBC: 0 % (ref 0.0–0.2)

## 2024-09-22 LAB — MAGNESIUM: Magnesium: 2.6 mg/dL — ABNORMAL HIGH (ref 1.7–2.4)

## 2024-09-22 MED ORDER — LAMOTRIGINE 100 MG PO TABS
400.0000 mg | ORAL_TABLET | Freq: Once | ORAL | Status: AC
  Administered 2024-09-22: 400 mg via ORAL
  Filled 2024-09-22: qty 4

## 2024-09-22 MED ORDER — ACETAMINOPHEN 500 MG PO TABS
1000.0000 mg | ORAL_TABLET | Freq: Once | ORAL | Status: AC
  Administered 2024-09-22: 1000 mg via ORAL
  Filled 2024-09-22: qty 2

## 2024-09-22 MED ORDER — ZONISAMIDE 100 MG PO CAPS
300.0000 mg | ORAL_CAPSULE | Freq: Once | ORAL | Status: AC
  Administered 2024-09-22: 300 mg via ORAL
  Filled 2024-09-22: qty 3

## 2024-09-22 NOTE — ED Triage Notes (Addendum)
 BIB EMS comes from home. Seizure and fall, family heard him fall, witnessed by family, post ictal with EMS, #20  ST 115 L AC, pt has seizures when he does not get enough sleep in hours.   Pt has a small lac to top of nose, ? From glasses

## 2024-09-22 NOTE — ED Provider Notes (Signed)
 " [place] EMERGENCY DEPARTMENT AT [facility] Provider Note   CSN: [id number] Arrival date & time: 09/22/24  1734     Patient presents with: Seizures and Fall   Franklin [name] is a 29 y.o. male.  He has a history of seizure disorder.  He had a seizure at home, witnessed by family.  Unclear how long it lasted.  EMS brought in for further evaluation.  Patient states he has been compliant with his medication but has not been getting enough sleep which led to a seizure.  He denies any specific complaints.  He has a small laceration in his right eyebrow.  {Add pertinent medical, surgical, social history, OB history to YEP:67052} The history is provided by the patient and the EMS personnel.  Seizures Seizure activity on arrival: no   Seizure type:  Grand mal Episode characteristics: generalized shaking   Postictal symptoms: confusion   Return to baseline: yes   Timing:  Once Progression:  Resolved Context: decreased sleep   Recent head injury:  During the event PTA treatment:  None History of seizures: yes   Similar to previous episodes: yes   Current therapy:  Lamotrigine  (zonisamide )      Prior to Admission medications  Medication Sig Start Date End Date Taking? Authorizing Provider  lamoTRIgine  (LAMICTAL ) 200 MG tablet TAKE 1 TABLET BY MOUTH IN THE MORNING AND 2 AT NIGHT 08/17/24   Marianna City, NP  zonisamide  (ZONEGRAN ) 100 MG capsule Take 3 capsules at bedtime 08/17/24   Marianna City, NP    Allergies: Concerta [methylphenidate]    Review of Systems  Neurological:  Positive for seizures.    Updated Vital Signs BP 111/78 (BP Location: Right Arm)   Pulse (!) 114   Temp 98 F (36.7 C) (Oral)   Resp 16   Ht 5' 9 (1.753 m)   Wt 59 kg   SpO2 100%   BMI 19.20 kg/m   Physical Exam Vitals and nursing note reviewed.  Constitutional:      General: He is not in acute distress.    Appearance: Normal appearance. He is well-developed.  HENT:      Head: Normocephalic.     Comments: He has approximately 1 cm laceration right eyebrow. Eyes:     Conjunctiva/sclera: Conjunctivae normal.  Cardiovascular:     Rate and Rhythm: Regular rhythm. Tachycardia present.     Heart sounds: No murmur heard. Pulmonary:     Effort: Pulmonary effort is normal. No respiratory distress.     Breath sounds: Normal breath sounds.  Abdominal:     Palpations: Abdomen is soft.     Tenderness: There is no abdominal tenderness. There is no guarding or rebound.  Musculoskeletal:        General: No deformity.     Cervical back: Neck supple.  Skin:    General: Skin is warm and dry.     Capillary Refill: Capillary refill takes less than 2 seconds.  Neurological:     General: No focal deficit present.     Mental Status: He is alert and oriented to person, place, and time.     Cranial Nerves: No cranial nerve deficit.     Sensory: No sensory deficit.     Motor: No weakness.     (all labs ordered are listed, but only abnormal results are displayed) Labs Reviewed  BASIC METABOLIC PANEL WITH GFR  CBC WITH DIFFERENTIAL/PLATELET  MAGNESIUM     EKG: None  Radiology: No results found.  {  Document cardiac monitor, telemetry assessment procedure when appropriate:32947} .Laceration Repair  Date/Time: 09/22/2024 6:06 PM  Performed by: Towana Ozell BROCKS, MD Authorized by: Towana Ozell BROCKS, MD   Consent:    Consent obtained:  Verbal   Consent given by:  Patient   Risks, benefits, and alternatives were discussed: yes     Risks discussed:  Infection, nerve damage, poor wound healing, pain, retained foreign body, tendon damage and vascular damage   Alternatives discussed:  No treatment, delayed treatment and referral Universal protocol:    Procedure explained and questions answered to patient or proxy's satisfaction: yes     Patient identity confirmed:  Verbally with patient Anesthesia:    Anesthesia method:  None Laceration details:    Location:   Face   Face location:  R eyebrow   Length (cm):  1 Treatment:    Area cleansed with:  Saline   Amount of cleaning:  Standard   Irrigation solution:  Sterile saline Skin repair:    Repair method:  Tissue adhesive Approximation:    Approximation:  Close Repair type:    Repair type:  Simple Post-procedure details:    Dressing:  Open (no dressing)   Procedure completion:  Tolerated well, no immediate complications    Medications Ordered in the ED - No data to display    {Click here for ABCD2, HEART and other calculators REFRESH Note before signing:1}                              Medical Decision Making Amount and/or Complexity of Data Reviewed Labs: ordered.   This patient complains of ***; this involves an extensive number of treatment Options and is a complaint that carries with it a high risk of complications and morbidity. The differential includes ***  I ordered, reviewed and interpreted labs, which included *** I ordered medication *** and reviewed PMP when indicated. I ordered imaging studies which included *** and I independently    visualized and interpreted imaging which showed *** Additional history obtained from *** Previous records obtained and reviewed *** I consulted *** and discussed lab and imaging findings and discussed disposition.  Cardiac monitoring reviewed, *** Social determinants considered, *** Critical Interventions: ***  After the interventions stated above, I reevaluated the patient and found *** Admission and further testing considered, ***   {Document critical care time when appropriate  Document review of labs and clinical decision tools ie CHADS2VASC2, etc  Document your independent review of radiology images and any outside records  Document your discussion with family members, caretakers and with consultants  Document social determinants of health affecting pt's care  Document your decision making why or why not admission, treatments  were needed:32947:::1}   Final diagnoses:  None    ED Discharge Orders     None        "

## 2024-09-22 NOTE — Discharge Instructions (Addendum)
 Please try to take your seizure medication as prescribed and get more rest.  Follow-up with your neurology team.  Return if any worsening or concerning symptoms.

## 2024-11-16 ENCOUNTER — Ambulatory Visit (INDEPENDENT_AMBULATORY_CARE_PROVIDER_SITE_OTHER): Payer: Self-pay | Admitting: Family
